# Patient Record
Sex: Female | Born: 1945 | Race: White | Hispanic: No | Marital: Married | Smoking: Former smoker
Health system: Southern US, Community
[De-identification: ages and names within clinical notes are randomized; demographics above are authoritative.]

## PROBLEM LIST (undated history)

## (undated) DIAGNOSIS — E785 Hyperlipidemia, unspecified: Secondary | ICD-10-CM

## (undated) DIAGNOSIS — Z9889 Other specified postprocedural states: Secondary | ICD-10-CM

## (undated) DIAGNOSIS — K8020 Calculus of gallbladder without cholecystitis without obstruction: Secondary | ICD-10-CM

## (undated) DIAGNOSIS — H269 Unspecified cataract: Secondary | ICD-10-CM

## (undated) DIAGNOSIS — D126 Benign neoplasm of colon, unspecified: Secondary | ICD-10-CM

## (undated) DIAGNOSIS — R112 Nausea with vomiting, unspecified: Secondary | ICD-10-CM

## (undated) DIAGNOSIS — R011 Cardiac murmur, unspecified: Secondary | ICD-10-CM

## (undated) DIAGNOSIS — Z952 Presence of prosthetic heart valve: Secondary | ICD-10-CM

## (undated) DIAGNOSIS — K5790 Diverticulosis of intestine, part unspecified, without perforation or abscess without bleeding: Secondary | ICD-10-CM

## (undated) DIAGNOSIS — R519 Headache, unspecified: Secondary | ICD-10-CM

## (undated) DIAGNOSIS — I350 Nonrheumatic aortic (valve) stenosis: Secondary | ICD-10-CM

## (undated) DIAGNOSIS — R51 Headache: Secondary | ICD-10-CM

## (undated) DIAGNOSIS — T7840XA Allergy, unspecified, initial encounter: Secondary | ICD-10-CM

## (undated) DIAGNOSIS — K219 Gastro-esophageal reflux disease without esophagitis: Secondary | ICD-10-CM

## (undated) DIAGNOSIS — M8580 Other specified disorders of bone density and structure, unspecified site: Secondary | ICD-10-CM

## (undated) DIAGNOSIS — B029 Zoster without complications: Secondary | ICD-10-CM

## (undated) DIAGNOSIS — I10 Essential (primary) hypertension: Secondary | ICD-10-CM

## (undated) HISTORY — DX: Benign neoplasm of colon, unspecified: D12.6

## (undated) HISTORY — PX: EYE SURGERY: SHX253

## (undated) HISTORY — DX: Unspecified cataract: H26.9

## (undated) HISTORY — DX: Diverticulosis of intestine, part unspecified, without perforation or abscess without bleeding: K57.90

## (undated) HISTORY — DX: Allergy, unspecified, initial encounter: T78.40XA

## (undated) HISTORY — DX: Essential (primary) hypertension: I10

## (undated) HISTORY — DX: Calculus of gallbladder without cholecystitis without obstruction: K80.20

## (undated) HISTORY — PX: APPENDECTOMY: SHX54

## (undated) HISTORY — DX: Other specified disorders of bone density and structure, unspecified site: M85.80

## (undated) HISTORY — PX: ABDOMINAL HYSTERECTOMY: SHX81

## (undated) HISTORY — DX: Presence of prosthetic heart valve: Z95.2

## (undated) HISTORY — DX: Hyperlipidemia, unspecified: E78.5

---

## 1985-05-17 DIAGNOSIS — D126 Benign neoplasm of colon, unspecified: Secondary | ICD-10-CM

## 1985-05-17 HISTORY — DX: Benign neoplasm of colon, unspecified: D12.6

## 1988-03-19 HISTORY — PX: BREAST EXCISIONAL BIOPSY: SUR124

## 1988-03-19 HISTORY — PX: BREAST BIOPSY: SHX20

## 1996-03-19 LAB — HM COLONOSCOPY: HM Colonoscopy: NEGATIVE

## 1998-01-17 LAB — HM DEXA SCAN

## 2000-03-18 ENCOUNTER — Encounter (INDEPENDENT_AMBULATORY_CARE_PROVIDER_SITE_OTHER): Payer: Self-pay | Admitting: Specialist

## 2000-03-18 ENCOUNTER — Other Ambulatory Visit: Admission: RE | Admit: 2000-03-18 | Discharge: 2000-03-18 | Payer: Self-pay | Admitting: Gastroenterology

## 2000-10-18 ENCOUNTER — Encounter: Payer: Self-pay | Admitting: Family Medicine

## 2000-10-18 ENCOUNTER — Encounter: Admission: RE | Admit: 2000-10-18 | Discharge: 2000-10-18 | Payer: Self-pay | Admitting: Family Medicine

## 2001-05-21 ENCOUNTER — Emergency Department (HOSPITAL_COMMUNITY): Admission: EM | Admit: 2001-05-21 | Discharge: 2001-05-21 | Payer: Self-pay | Admitting: Emergency Medicine

## 2001-05-21 ENCOUNTER — Encounter: Payer: Self-pay | Admitting: Emergency Medicine

## 2002-03-09 ENCOUNTER — Other Ambulatory Visit: Admission: RE | Admit: 2002-03-09 | Discharge: 2002-03-09 | Payer: Self-pay | Admitting: Family Medicine

## 2004-03-01 ENCOUNTER — Ambulatory Visit: Payer: Self-pay | Admitting: Family Medicine

## 2004-09-20 ENCOUNTER — Ambulatory Visit: Payer: Self-pay | Admitting: Family Medicine

## 2004-10-10 ENCOUNTER — Ambulatory Visit: Payer: Self-pay | Admitting: Family Medicine

## 2004-12-21 ENCOUNTER — Ambulatory Visit: Payer: Self-pay | Admitting: Family Medicine

## 2005-01-23 ENCOUNTER — Ambulatory Visit: Payer: Self-pay | Admitting: Family Medicine

## 2005-04-10 ENCOUNTER — Ambulatory Visit: Payer: Self-pay | Admitting: Family Medicine

## 2005-09-25 ENCOUNTER — Other Ambulatory Visit: Admission: RE | Admit: 2005-09-25 | Discharge: 2005-09-25 | Payer: Self-pay | Admitting: Family Medicine

## 2005-09-25 ENCOUNTER — Ambulatory Visit: Payer: Self-pay | Admitting: Family Medicine

## 2005-09-25 LAB — CONVERTED CEMR LAB: Pap Smear: NORMAL

## 2005-10-10 ENCOUNTER — Ambulatory Visit: Payer: Self-pay | Admitting: Family Medicine

## 2005-10-17 ENCOUNTER — Ambulatory Visit: Payer: Self-pay | Admitting: Family Medicine

## 2005-10-17 HISTORY — PX: KNEE SURGERY: SHX244

## 2006-01-29 ENCOUNTER — Ambulatory Visit: Payer: Self-pay | Admitting: Family Medicine

## 2006-10-07 ENCOUNTER — Encounter: Payer: Self-pay | Admitting: Family Medicine

## 2006-10-09 ENCOUNTER — Encounter: Payer: Self-pay | Admitting: Family Medicine

## 2006-10-09 DIAGNOSIS — J45909 Unspecified asthma, uncomplicated: Secondary | ICD-10-CM | POA: Insufficient documentation

## 2006-10-09 DIAGNOSIS — Z87891 Personal history of nicotine dependence: Secondary | ICD-10-CM | POA: Insufficient documentation

## 2006-10-09 DIAGNOSIS — E7800 Pure hypercholesterolemia, unspecified: Secondary | ICD-10-CM | POA: Insufficient documentation

## 2006-10-09 DIAGNOSIS — M1990 Unspecified osteoarthritis, unspecified site: Secondary | ICD-10-CM | POA: Insufficient documentation

## 2006-10-09 DIAGNOSIS — Z860100 Personal history of colon polyps, unspecified: Secondary | ICD-10-CM | POA: Insufficient documentation

## 2006-10-09 DIAGNOSIS — H409 Unspecified glaucoma: Secondary | ICD-10-CM | POA: Insufficient documentation

## 2006-10-09 DIAGNOSIS — I10 Essential (primary) hypertension: Secondary | ICD-10-CM | POA: Insufficient documentation

## 2006-10-21 ENCOUNTER — Encounter (INDEPENDENT_AMBULATORY_CARE_PROVIDER_SITE_OTHER): Payer: Self-pay | Admitting: *Deleted

## 2006-10-22 ENCOUNTER — Ambulatory Visit: Payer: Self-pay | Admitting: Family Medicine

## 2006-10-23 LAB — CONVERTED CEMR LAB
ALT: 31 units/L (ref 0–35)
AST: 32 units/L (ref 0–37)
Albumin: 3.9 g/dL (ref 3.5–5.2)
Alkaline Phosphatase: 90 units/L (ref 39–117)
BUN: 10 mg/dL (ref 6–23)
Basophils Absolute: 0.1 10*3/uL (ref 0.0–0.1)
Basophils Relative: 1.1 % — ABNORMAL HIGH (ref 0.0–1.0)
Bilirubin, Direct: 0.1 mg/dL (ref 0.0–0.3)
CO2: 29 meq/L (ref 19–32)
Calcium: 10.3 mg/dL (ref 8.4–10.5)
Chloride: 100 meq/L (ref 96–112)
Cholesterol: 176 mg/dL (ref 0–200)
Creatinine, Ser: 1 mg/dL (ref 0.4–1.2)
Eosinophils Absolute: 0.1 10*3/uL (ref 0.0–0.6)
Eosinophils Relative: 1.8 % (ref 0.0–5.0)
GFR calc Af Amer: 72 mL/min
GFR calc non Af Amer: 60 mL/min
Glucose, Bld: 88 mg/dL (ref 70–99)
HCT: 34.3 % — ABNORMAL LOW (ref 36.0–46.0)
HDL: 63.2 mg/dL (ref >39.0)
Hemoglobin: 11.7 g/dL — ABNORMAL LOW (ref 12.0–15.0)
LDL Cholesterol: 93 mg/dL (ref 0–99)
Lymphocytes Relative: 26 % (ref 12.0–46.0)
MCHC: 34.1 g/dL (ref 30.0–36.0)
MCV: 89.1 fL (ref 78.0–100.0)
Monocytes Absolute: 0.6 10*3/uL (ref 0.2–0.7)
Monocytes Relative: 9.2 % (ref 3.0–11.0)
Neutro Abs: 3.9 10*3/uL (ref 1.4–7.7)
Neutrophils Relative %: 61.9 % (ref 43.0–77.0)
Phosphorus: 4.4 mg/dL (ref 2.3–4.6)
Platelets: 284 10*3/uL (ref 150–400)
Potassium: 3.7 meq/L (ref 3.5–5.1)
RBC: 3.85 M/uL — ABNORMAL LOW (ref 3.87–5.11)
RDW: 12.8 % (ref 11.5–14.6)
Sodium: 137 meq/L (ref 135–145)
TSH: 1.18 microintl units/mL (ref 0.35–5.50)
Total Bilirubin: 1 mg/dL (ref 0.3–1.2)
Total CHOL/HDL Ratio: 2.8
Total Protein: 7.2 g/dL (ref 6.0–8.3)
Triglycerides: 101 mg/dL (ref 0–149)
VLDL: 20 mg/dL (ref 0–40)
WBC: 6.3 10*3/uL (ref 4.5–10.5)

## 2006-10-24 ENCOUNTER — Ambulatory Visit: Payer: Self-pay | Admitting: Family Medicine

## 2006-10-25 LAB — CONVERTED CEMR LAB: Vit D, 1,25-Dihydroxy: 49 (ref 20–57)

## 2006-11-01 ENCOUNTER — Ambulatory Visit: Payer: Self-pay | Admitting: Family Medicine

## 2006-11-04 ENCOUNTER — Encounter (INDEPENDENT_AMBULATORY_CARE_PROVIDER_SITE_OTHER): Payer: Self-pay | Admitting: *Deleted

## 2006-11-04 LAB — FECAL OCCULT BLOOD, GUAIAC: Fecal Occult Blood: NEGATIVE

## 2006-12-05 ENCOUNTER — Ambulatory Visit: Payer: Self-pay | Admitting: Family Medicine

## 2006-12-05 DIAGNOSIS — D649 Anemia, unspecified: Secondary | ICD-10-CM | POA: Insufficient documentation

## 2006-12-06 ENCOUNTER — Encounter (INDEPENDENT_AMBULATORY_CARE_PROVIDER_SITE_OTHER): Payer: Self-pay | Admitting: *Deleted

## 2006-12-06 LAB — CONVERTED CEMR LAB
ALT: 26 units/L (ref 0–35)
AST: 30 units/L (ref 0–37)
Cholesterol: 173 mg/dL (ref 0–200)
Ferritin: 27.3 ng/mL (ref 10.0–291.0)
HDL: 70.4 mg/dL (ref >39.0)
Hemoglobin: 11.8 g/dL — ABNORMAL LOW (ref 12.0–15.0)
LDL Cholesterol: 90 mg/dL (ref 0–99)
Total CHOL/HDL Ratio: 2.5
Triglycerides: 63 mg/dL (ref 0–149)
VLDL: 13 mg/dL (ref 0–40)

## 2007-01-10 ENCOUNTER — Telehealth (INDEPENDENT_AMBULATORY_CARE_PROVIDER_SITE_OTHER): Payer: Self-pay | Admitting: *Deleted

## 2007-09-08 ENCOUNTER — Encounter: Payer: Self-pay | Admitting: Family Medicine

## 2007-09-09 ENCOUNTER — Telehealth (INDEPENDENT_AMBULATORY_CARE_PROVIDER_SITE_OTHER): Payer: Self-pay | Admitting: Internal Medicine

## 2007-10-08 ENCOUNTER — Encounter: Payer: Self-pay | Admitting: Family Medicine

## 2007-10-14 ENCOUNTER — Ambulatory Visit: Payer: Self-pay | Admitting: Gastroenterology

## 2007-10-28 ENCOUNTER — Ambulatory Visit: Payer: Self-pay | Admitting: Gastroenterology

## 2007-10-28 LAB — HM COLONOSCOPY

## 2008-10-12 ENCOUNTER — Encounter: Payer: Self-pay | Admitting: Family Medicine

## 2008-10-15 ENCOUNTER — Encounter: Payer: Self-pay | Admitting: Family Medicine

## 2009-01-18 ENCOUNTER — Ambulatory Visit: Payer: Self-pay | Admitting: Family Medicine

## 2009-01-20 LAB — CONVERTED CEMR LAB
ALT: 24 units/L (ref 0–35)
AST: 31 units/L (ref 0–37)
Albumin: 4.1 g/dL (ref 3.5–5.2)
Alkaline Phosphatase: 66 units/L (ref 39–117)
BUN: 19 mg/dL (ref 6–23)
Basophils Absolute: 0.1 10*3/uL (ref 0.0–0.1)
Basophils Relative: 1.7 % (ref 0.0–3.0)
Bilirubin, Direct: 0.1 mg/dL (ref 0.0–0.3)
CO2: 29 meq/L (ref 19–32)
Calcium: 9.5 mg/dL (ref 8.4–10.5)
Chloride: 102 meq/L (ref 96–112)
Cholesterol: 183 mg/dL (ref 0–200)
Creatinine, Ser: 1.2 mg/dL (ref 0.4–1.2)
Eosinophils Absolute: 0.1 10*3/uL (ref 0.0–0.7)
Eosinophils Relative: 1.6 % (ref 0.0–5.0)
GFR calc non Af Amer: 48.02 mL/min (ref >60)
Glucose, Bld: 91 mg/dL (ref 70–99)
HCT: 37.4 % (ref 36.0–46.0)
HDL: 62.5 mg/dL (ref >39.00)
Hemoglobin: 13.3 g/dL (ref 12.0–15.0)
LDL Cholesterol: 105 mg/dL — ABNORMAL HIGH (ref 0–99)
Lymphocytes Relative: 29.7 % (ref 12.0–46.0)
Lymphs Abs: 1.5 10*3/uL (ref 0.7–4.0)
MCHC: 35.7 g/dL (ref 30.0–36.0)
MCV: 93.2 fL (ref 78.0–100.0)
Monocytes Absolute: 0.5 10*3/uL (ref 0.1–1.0)
Monocytes Relative: 9.8 % (ref 3.0–12.0)
Neutro Abs: 2.9 10*3/uL (ref 1.4–7.7)
Neutrophils Relative %: 57.2 % (ref 43.0–77.0)
Platelets: 244 10*3/uL (ref 150.0–400.0)
Potassium: 4.1 meq/L (ref 3.5–5.1)
RBC: 4.01 M/uL (ref 3.87–5.11)
RDW: 12.5 % (ref 11.5–14.6)
Sodium: 138 meq/L (ref 135–145)
TSH: 0.99 microintl units/mL (ref 0.35–5.50)
Total Bilirubin: 0.8 mg/dL (ref 0.3–1.2)
Total CHOL/HDL Ratio: 3
Total Protein: 7.6 g/dL (ref 6.0–8.3)
Triglycerides: 80 mg/dL (ref 0.0–149.0)
VLDL: 16 mg/dL (ref 0.0–40.0)
WBC: 5.1 10*3/uL (ref 4.5–10.5)

## 2009-03-22 ENCOUNTER — Encounter: Payer: Self-pay | Admitting: Family Medicine

## 2009-04-04 ENCOUNTER — Encounter: Payer: Self-pay | Admitting: Family Medicine

## 2009-04-05 ENCOUNTER — Encounter: Payer: Self-pay | Admitting: Family Medicine

## 2009-04-07 ENCOUNTER — Telehealth: Payer: Self-pay | Admitting: Family Medicine

## 2009-11-15 ENCOUNTER — Encounter: Payer: Self-pay | Admitting: Family Medicine

## 2009-11-15 LAB — HM MAMMOGRAPHY: HM Mammogram: NORMAL

## 2009-11-17 ENCOUNTER — Encounter: Payer: Self-pay | Admitting: Family Medicine

## 2009-12-21 ENCOUNTER — Ambulatory Visit: Payer: Self-pay | Admitting: Family Medicine

## 2009-12-21 DIAGNOSIS — K219 Gastro-esophageal reflux disease without esophagitis: Secondary | ICD-10-CM | POA: Insufficient documentation

## 2010-01-20 ENCOUNTER — Telehealth (INDEPENDENT_AMBULATORY_CARE_PROVIDER_SITE_OTHER): Payer: Self-pay | Admitting: *Deleted

## 2010-01-24 ENCOUNTER — Ambulatory Visit: Payer: Self-pay | Admitting: Family Medicine

## 2010-01-25 LAB — CONVERTED CEMR LAB
ALT: 24 units/L (ref 0–35)
AST: 26 units/L (ref 0–37)
Albumin: 3.8 g/dL (ref 3.5–5.2)
Alkaline Phosphatase: 79 units/L (ref 39–117)
BUN: 20 mg/dL (ref 6–23)
Basophils Absolute: 0.1 10*3/uL (ref 0.0–0.1)
Basophils Relative: 1.2 % (ref 0.0–3.0)
Bilirubin, Direct: 0.1 mg/dL (ref 0.0–0.3)
CO2: 31 meq/L (ref 19–32)
Calcium: 10 mg/dL (ref 8.4–10.5)
Chloride: 100 meq/L (ref 96–112)
Cholesterol: 178 mg/dL (ref 0–200)
Creatinine, Ser: 1.2 mg/dL (ref 0.4–1.2)
Eosinophils Absolute: 0.1 10*3/uL (ref 0.0–0.7)
Eosinophils Relative: 1.4 % (ref 0.0–5.0)
GFR calc non Af Amer: 50.27 mL/min (ref >60)
Glucose, Bld: 99 mg/dL (ref 70–99)
HCT: 37.8 % (ref 36.0–46.0)
HDL: 56.9 mg/dL (ref >39.00)
Hemoglobin: 12.9 g/dL (ref 12.0–15.0)
LDL Cholesterol: 99 mg/dL (ref 0–99)
Lymphocytes Relative: 26.5 % (ref 12.0–46.0)
Lymphs Abs: 1.6 10*3/uL (ref 0.7–4.0)
MCHC: 34 g/dL (ref 30.0–36.0)
MCV: 92.8 fL (ref 78.0–100.0)
Monocytes Absolute: 0.5 10*3/uL (ref 0.1–1.0)
Monocytes Relative: 8.2 % (ref 3.0–12.0)
Neutro Abs: 3.9 10*3/uL (ref 1.4–7.7)
Neutrophils Relative %: 62.7 % (ref 43.0–77.0)
Phosphorus: 3.7 mg/dL (ref 2.3–4.6)
Platelets: 272 10*3/uL (ref 150.0–400.0)
Potassium: 4.5 meq/L (ref 3.5–5.1)
RBC: 4.08 M/uL (ref 3.87–5.11)
RDW: 13.2 % (ref 11.5–14.6)
Sodium: 139 meq/L (ref 135–145)
TSH: 1.6 microintl units/mL (ref 0.35–5.50)
Total Bilirubin: 0.6 mg/dL (ref 0.3–1.2)
Total CHOL/HDL Ratio: 3
Total Protein: 6.9 g/dL (ref 6.0–8.3)
Triglycerides: 109 mg/dL (ref 0.0–149.0)
VLDL: 21.8 mg/dL (ref 0.0–40.0)
WBC: 6.2 10*3/uL (ref 4.5–10.5)

## 2010-01-31 ENCOUNTER — Ambulatory Visit: Payer: Self-pay | Admitting: Family Medicine

## 2010-01-31 DIAGNOSIS — M8580 Other specified disorders of bone density and structure, unspecified site: Secondary | ICD-10-CM | POA: Insufficient documentation

## 2010-02-01 ENCOUNTER — Encounter: Payer: Self-pay | Admitting: Family Medicine

## 2010-02-01 LAB — HM DEXA SCAN: HM Dexa Scan: NORMAL

## 2010-02-07 ENCOUNTER — Telehealth: Payer: Self-pay | Admitting: Family Medicine

## 2010-02-14 ENCOUNTER — Encounter: Payer: Self-pay | Admitting: Family Medicine

## 2010-04-18 NOTE — Assessment & Plan Note (Signed)
Summary: Heidi Middleton / [facility]  Nurse Visit    Prior Medications: GNP GLUCOSAMINE CHONDROITIN   TABS (MISC NATURAL PRODUCTS) take 1000 mg by mouth daily HYDROCHLOROTHIAZIDE 25 MG  TABS (HYDROCHLOROTHIAZIDE) take one by mouth daily FISH OIL TABS () take by mouth as directed LIPITOR 10 MG  TABS (ATORVASTATIN CALCIUM) one by mouth qd CALCIUM WITH VIT D () take 1200 mg by mouth daily VITAMIN C 1000 MG  TABS (ASCORBIC ACID) take one by mouth daily Current Allergies: No known allergies    Zostavax # 1    Vaccine Type: Zostavax    Site: left deltoid    Mfr: Merck    Dose: 0.5 ml    Route: [place]    Given by: Lowella Petties    Exp. Date: 10/11/2007    Lot #: [id number]    VIS given: 12/29/04 given October 24, 2006.   Orders Added: 1)  Zoster (Shingles) Vaccine Live [90736] 2)  Admin 1st Vaccine [phone]
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
## 2010-04-18 NOTE — Progress Notes (Signed)
Summary: 90x3 lipitor rx  Phone Note Refill Request Message from:  Fax from Pharmacy on January 10, 2007 8:07 AM  Refills Requested: Medication #1:  LIPITOR 10 MG  TABS one half by mouth qd rx approved by dr Milinda Antis #90x3  Initial call taken by: Liane Comber,  January 10, 2007 8:07 AM
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
## 2010-04-18 NOTE — Progress Notes (Signed)
Summary: prior Berkley Harvey is needed for nexium  Phone Note Other Incoming   Caller: [employer] Summary of Call: Prior Berkley Harvey is needed for nexium, form is on your shelf. Initial call taken by: Lowella Petties CMA, AAMA,  February 07, 2010 3:51 PM  Follow-up for Phone Call        I read it -- it asks if pt has previously tried or failed other meds for GERD-- and what they are let me know  if not -- they may not cover nexium and we would need to switch meds   Follow-up by: Judith Part MD,  February 08, 2010 8:10 AM  Additional Follow-up for Phone Call Additional follow up Details #1::        Asked husband to have her return call.  Lugene Fuquay CMA Duncan Dull)  February 08, 2010 9:26 AM   Pt states she has tried otc omeprazole and zantac, along with other otc brands. Additional Follow-up by: Lowella Petties CMA, AAMA,  February 08, 2010 10:02 AM   New Allergies: * OMEPRAZOLE ZANTAC Additional Follow-up for Phone Call Additional follow up Details #2::    thank you  form done and in nurse in box  Follow-up by: Judith Part MD,  February 08, 2010 1:37 PM  Additional Follow-up for Phone Call Additional follow up Details #3:: Details for Additional Follow-up Action Taken: Form faxed.              Lowella Petties CMA, AAMA  February 08, 2010 3:41 PM   Prior Berkley Harvey given for nexium, approval letter placed on doctors desk for signature and scanning. Additional Follow-up by: Lowella Petties CMA, AAMA,  February 14, 2010 9:00 AM  New Allergies: * OMEPRAZOLE ZANTAC

## 2010-04-18 NOTE — Progress Notes (Signed)
Summary: regarding labs  Phone Note Call from Patient Call back at Home Phone [phone]   Caller: Patient Call For: Judith Part MD Summary of Call: Pt is asking when do you want her to come back in for labs. Initial call taken by: Lowella Petties CMA,  April 07, 2009 10:57 AM  Follow-up for Phone Call        1 year - 1 week prior to PE please Follow-up by: Judith Part MD,  April 07, 2009 12:20 PM  Additional Follow-up for Phone Call Additional follow up Details #1::        Appts made for labs and physical. Additional Follow-up by: Lowella Petties CMA,  April 07, 2009 12:41 PM

## 2010-04-18 NOTE — Miscellaneous (Signed)
Summary: previsit prep  Clinical Lists Changes  Medications: Added new medication of MOVIPREP 100 GM  SOLR (PEG-KCL-NACL-NASULF-NA ASC-C) As per prep instructions. - Signed Rx of MOVIPREP 100 GM  SOLR (PEG-KCL-NACL-NASULF-NA ASC-C) As per prep instructions.;  #1 x 0;  Signed;  Entered by: Eual Fines RN;  Authorized by: Mardella Layman MD FACG,FAGA;  Method used: Electronic Allergies: Added new allergy or adverse reaction of CODEINE Observations: Added new observation of NKA: F (10/14/2007 8:12)    Prescriptions: MOVIPREP 100 GM  SOLR (PEG-KCL-NACL-NASULF-NA ASC-C) As per prep instructions.  #1 x 0   Entered by:   Eual Fines RN   Authorized by:   Mardella Layman MD [facility]   Signed by:   Eual Fines RN on 10/14/2007   Method used:   Elect[facility] to ...       [facility] [address] [id number]       [address]       [place], [place]  [address]       Ph: [phone] or [phone]       Fax: [phone]   RxID:   [id number]
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
## 2010-04-18 NOTE — Medication Information (Signed)
Summary: Prior autho & approved for Nexium/BCBS  Prior autho & approved for Nexium/BCBS   Imported By: Sherian Rein 02/21/2010 08:35:47  _____________________________________________________________________  External Attachment:    Type:   Image     Comment:   External Document

## 2010-04-18 NOTE — Miscellaneous (Signed)
Summary: med dose change  Clinical Lists Changes  Medications: Changed medication from LIPITOR 10 MG  TABS (ATORVASTATIN CALCIUM) one by mouth qd to LIPITOR 10 MG  TABS (ATORVASTATIN CALCIUM) one half by mouth qd      Prior Medications: GNP GLUCOSAMINE CHONDROITIN   TABS (MISC NATURAL PRODUCTS) take 1000 mg by mouth daily HYDROCHLOROTHIAZIDE 25 MG  TABS (HYDROCHLOROTHIAZIDE) take one by mouth daily FISH OIL TABS () take by mouth as directed LIPITOR 10 MG  TABS (ATORVASTATIN CALCIUM) one half by mouth qd CALCIUM WITH VIT D () take 1200 mg by mouth daily VITAMIN C 1000 MG  TABS (ASCORBIC ACID) take one by mouth daily Current Allergies: No known allergies

## 2010-04-18 NOTE — Assessment & Plan Note (Signed)
Summary: hemo results    Preventive Care Screening  Hemoccult:    Date:  11/04/2006    Results:  negative x 3

## 2010-04-18 NOTE — Assessment & Plan Note (Signed)
Summary: mammo results    Preventive Care Screening  Mammogram:    Date:  10/21/2006    Results:  normal

## 2010-04-18 NOTE — Progress Notes (Signed)
Summary: Mammogram and bone density test  Phone Note Call from Patient Call back at Home Phone [phone]   Caller: Patient Call For: Billie Karthik Whittinghill in Dr. Royden Purl absence Summary of Call: Patient says she has gotten notification that it is time for her bone density test and mammogram.  She says she goes to the one on [address] in [place].  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 However, it has to be after 10/01/07 for insurance reasons.  Wants to be scheduled at the same time for mammo and bone density test.  Please send referral to [facility] for scheduling. Initial call taken by: Delilah Shan,  September 09, 2007 12:26 PM  Follow-up for Phone Call        order for mammo and DEXA attatched   Billie-Lynn Tyler Deis FNP  September 10, 2007 6:14 AM   Additional Follow-up for Phone Call Additional follow up Details #1::        MMG AND DEX ORDERED FOR 10/03/07 AT 10:30. Carlton Adam  September 10, 2007 10:57 AM  Additional Follow-up by: Carlton Adam,  September 10, 2007 10:57 AM  New Problems: OTHER SCREENING MAMMOGRAM (ICD-V76.12)   New Problems: OTHER SCREENING MAMMOGRAM (ICD-V76.12)

## 2010-04-18 NOTE — Letter (Signed)
Summary: Results Follow up Letter  [place] [facility]  [address] [place], [place] [address]   Phone: [phone]  Fax: [phone]    11/17/2009 MRN: [id number]    [facility] [address] [place], [place]  [address]    Dear Ms. Sturdivant,  The following are the results of your recent test(s):  Test         Result    Pap Smear:        Normal _____  Not Normal _____ Comments: ______________________________________________________ Cholesterol: LDL(Bad cholesterol):         Your goal is less than:         HDL (Good cholesterol):       Your goal is more than: Comments:  ______________________________________________________ Mammogram:        Normal _X___  Not Normal _____ Comments:Repeat in one year.   ___________________________________________________________________ Hemoccult:        Normal _____  Not normal _______ Comments:    _____________________________________________________________________ Other Tests:    We routinely do not discuss normal results over the telephone.  If you desire a copy of the results, or you have any questions about this information we can discuss them at your next office visit.   Sincerely,    Idamae Schuller Ahamed Hofland,MD  MT/ri

## 2010-04-18 NOTE — Progress Notes (Signed)
----   Converted from flag ---- ---- 01/19/2010 5:16 PM, Colon Flattery Tower MD wrote: please check renal / lipid/hepatic/ cbc with diff/ tsh for 401.1 thanks  ---- 01/19/2010 7:49 AM, Liane Comber CMA (AAMA) wrote: Lab orders please! Good Morning! This pt is scheduled for cpx labs Tuesday, which labs to draw and dx codes to use? Thanks Tasha ------------------------------
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
## 2010-05-23 ENCOUNTER — Telehealth: Payer: Self-pay | Admitting: Family Medicine

## 2010-05-23 ENCOUNTER — Encounter: Payer: Self-pay | Admitting: Family Medicine

## 2010-05-23 ENCOUNTER — Ambulatory Visit (INDEPENDENT_AMBULATORY_CARE_PROVIDER_SITE_OTHER): Payer: Medicare Other

## 2010-05-23 DIAGNOSIS — Z111 Encounter for screening for respiratory tuberculosis: Secondary | ICD-10-CM

## 2010-05-25 ENCOUNTER — Ambulatory Visit: Payer: Medicare Other

## 2010-05-25 ENCOUNTER — Encounter: Payer: Self-pay | Admitting: Family Medicine

## 2010-05-30 NOTE — Letter (Signed)
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

## 2010-05-30 NOTE — Assessment & Plan Note (Signed)
Summary: tb test jrt  Nurse Visit   Allergies: 1)  ! Codeine 2)  * Omeprazole 3)  Zantac  Immunizations Administered:  PPD Skin Test:    Vaccine Type: PPD    Site: right forearm    Mfr: Sanofi Pasteur    Dose: 0.1 ml    Route: ID    Given by: Linde Gillis CMA (AAMA)    Exp. Date: 05/31/2012    Lot #: [id number]  Orders Added: 1)  TB Skin Test [86580] 2)  Admin 1st Vaccine [phone]

## 2010-05-30 NOTE — Progress Notes (Signed)
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 Initial call taken by: Lowella Petties CMA, AAMA,  May 23, 2010 4:11 PM  Follow-up for Phone Call        I signed and filled it out - just needs PPD result when it comes in  will leave on Rena's desk Follow-up by: Judith Part MD,  May 23, 2010 4:49 PM  Additional Follow-up for Phone Call Additional follow up Details #1::        Completed form copied to be scanned here at [facility] site. Completed form was given to pt as instructed.Lewanda Rife LPN  May 25, 2839 2:40 PM

## 2010-06-06 NOTE — Assessment & Plan Note (Signed)
Summary: ppd reading jrt  Nurse Visit   Allergies: 1)  ! Codeine 2)  * Omeprazole 3)  Zantac  PPD Results    Date of reading: 05/25/2010    Results: < 5mm    Interpretation: negative  CC: TB skin test recheck  The patient presented after 48 hours to check the injeciton site for positive or negative reaction.  Injection site examination: No firm bump forms at the test site. slightly reddish appearance and diameter was smaller than 5mm.   Assessment and Plan:  Negative TB skin test. Patient was counseled to call if experience any irritation at site.Lewanda Rife LPN  May 25, 5954 2:35 PM

## 2010-11-27 LAB — HM MAMMOGRAPHY: HM Mammogram: NORMAL

## 2010-12-19 ENCOUNTER — Other Ambulatory Visit: Payer: Self-pay | Admitting: *Deleted

## 2010-12-19 MED ORDER — ATORVASTATIN CALCIUM 10 MG PO TABS: ORAL_TABLET | ORAL | Status: DC

## 2010-12-19 MED ORDER — ESOMEPRAZOLE MAGNESIUM 40 MG PO CPDR: 40.0000 mg | DELAYED_RELEASE_CAPSULE | Freq: Every day | ORAL | Status: DC

## 2010-12-19 NOTE — Telephone Encounter (Signed)
Received faxed refill request.  Patients last OV was 01/2010, next OV 03/2011.  Please advise.

## 2010-12-19 NOTE — Telephone Encounter (Signed)
Will refill electronically  

## 2010-12-25 ENCOUNTER — Encounter: Payer: Self-pay | Admitting: *Deleted

## 2011-03-30 ENCOUNTER — Other Ambulatory Visit: Payer: BC Managed Care – PPO

## 2011-04-06 ENCOUNTER — Encounter: Payer: BC Managed Care – PPO | Admitting: Family Medicine

## 2011-04-26 ENCOUNTER — Other Ambulatory Visit: Payer: Self-pay

## 2011-04-26 MED ORDER — HYDROCHLOROTHIAZIDE 25 MG PO TABS: 25.0000 mg | ORAL_TABLET | Freq: Every day | ORAL | Status: DC

## 2011-04-26 NOTE — Telephone Encounter (Signed)
[facility] request refill HCTZ 25 mg. Pt last seen 01/31/10 but pt already has scheduled CPX pm 06/11/11.Please advise.

## 2011-04-26 NOTE — Telephone Encounter (Signed)
Will refill electronically  

## 2011-05-01 ENCOUNTER — Other Ambulatory Visit: Payer: Self-pay | Admitting: *Deleted

## 2011-05-01 MED ORDER — HYDROCHLOROTHIAZIDE 25 MG PO TABS: 25.0000 mg | ORAL_TABLET | Freq: Every day | ORAL | Status: DC

## 2011-05-01 NOTE — Telephone Encounter (Signed)
Medication sent to the wrong pharmacy on 1st request

## 2011-06-03 ENCOUNTER — Telehealth: Payer: Self-pay | Admitting: Family Medicine

## 2011-06-03 DIAGNOSIS — E7800 Pure hypercholesterolemia, unspecified: Secondary | ICD-10-CM

## 2011-06-03 DIAGNOSIS — M899 Disorder of bone, unspecified: Secondary | ICD-10-CM

## 2011-06-03 DIAGNOSIS — D649 Anemia, unspecified: Secondary | ICD-10-CM

## 2011-06-03 DIAGNOSIS — M949 Disorder of cartilage, unspecified: Secondary | ICD-10-CM

## 2011-06-03 DIAGNOSIS — I10 Essential (primary) hypertension: Secondary | ICD-10-CM

## 2011-06-03 NOTE — Telephone Encounter (Signed)
Message copied by Judy Pimple on Sun Jun 03, 2011  8:56 PM ------      Message from: Baldomero Lamy      Created: Wed May 30, 2011 10:02 AM      Regarding: cpx labs Mon 06/04/11       Please order  future cpx labs for pt's upcomming lab appt.      Thanks      Rodney Booze

## 2011-06-04 ENCOUNTER — Other Ambulatory Visit: Payer: Self-pay | Admitting: Family Medicine

## 2011-06-04 ENCOUNTER — Other Ambulatory Visit (INDEPENDENT_AMBULATORY_CARE_PROVIDER_SITE_OTHER): Payer: Medicare Other

## 2011-06-04 DIAGNOSIS — M949 Disorder of cartilage, unspecified: Secondary | ICD-10-CM

## 2011-06-04 DIAGNOSIS — E7800 Pure hypercholesterolemia, unspecified: Secondary | ICD-10-CM

## 2011-06-04 DIAGNOSIS — I10 Essential (primary) hypertension: Secondary | ICD-10-CM

## 2011-06-04 DIAGNOSIS — D649 Anemia, unspecified: Secondary | ICD-10-CM

## 2011-06-04 DIAGNOSIS — M899 Disorder of bone, unspecified: Secondary | ICD-10-CM

## 2011-06-04 LAB — CBC WITH DIFFERENTIAL/PLATELET
Basophils Absolute: 0 10*3/uL (ref 0.0–0.1)
Basophils Relative: 0.8 % (ref 0.0–3.0)
Eosinophils Absolute: 0.1 10*3/uL (ref 0.0–0.7)
Eosinophils Relative: 1.8 % (ref 0.0–5.0)
HCT: 39 % (ref 36.0–46.0)
Hemoglobin: 13 g/dL (ref 12.0–15.0)
Lymphocytes Relative: 27.1 % (ref 12.0–46.0)
Lymphs Abs: 1.7 10*3/uL (ref 0.7–4.0)
MCHC: 33.3 g/dL (ref 30.0–36.0)
MCV: 91.7 fl (ref 78.0–100.0)
Monocytes Absolute: 0.5 10*3/uL (ref 0.1–1.0)
Monocytes Relative: 8.3 % (ref 3.0–12.0)
Neutro Abs: 3.9 10*3/uL (ref 1.4–7.7)
Neutrophils Relative %: 62 % (ref 43.0–77.0)
Platelets: 243 10*3/uL (ref 150.0–400.0)
RBC: 4.25 Mil/uL (ref 3.87–5.11)
RDW: 13.2 % (ref 11.5–14.6)
WBC: 6.3 10*3/uL (ref 4.5–10.5)

## 2011-06-04 LAB — COMPREHENSIVE METABOLIC PANEL
ALT: 21 U/L (ref 0–35)
AST: 28 U/L (ref 0–37)
Albumin: 3.9 g/dL (ref 3.5–5.2)
Alkaline Phosphatase: 64 U/L (ref 39–117)
BUN: 16 mg/dL (ref 6–23)
CO2: 28 mEq/L (ref 19–32)
Calcium: 9.8 mg/dL (ref 8.4–10.5)
Chloride: 101 mEq/L (ref 96–112)
Creatinine, Ser: 1.2 mg/dL (ref 0.4–1.2)
GFR: 45.89 mL/min — ABNORMAL LOW (ref >60.00)
Glucose, Bld: 95 mg/dL (ref 70–99)
Potassium: 3.9 mEq/L (ref 3.5–5.1)
Sodium: 137 mEq/L (ref 135–145)
Total Bilirubin: 0.4 mg/dL (ref 0.3–1.2)
Total Protein: 7.5 g/dL (ref 6.0–8.3)

## 2011-06-04 LAB — LIPID PANEL
Cholesterol: 186 mg/dL (ref 0–200)
HDL: 82.6 mg/dL (ref >39.00)
LDL Cholesterol: 89 mg/dL (ref 0–99)
Total CHOL/HDL Ratio: 2
Triglycerides: 72 mg/dL (ref 0.0–149.0)
VLDL: 14.4 mg/dL (ref 0.0–40.0)

## 2011-06-04 LAB — TSH: TSH: 1.56 u[IU]/mL (ref 0.35–5.50)

## 2011-06-04 NOTE — Telephone Encounter (Signed)
[facility] nexium 40 mg #90 x 0. Pt already has scheduled appt CPX on 06/11/11.

## 2011-06-05 LAB — VITAMIN D 25 HYDROXY (VIT D DEFICIENCY, FRACTURES): Vit D, 25-Hydroxy: 51 ng/mL (ref 30–89)

## 2011-06-07 ENCOUNTER — Encounter: Payer: Self-pay | Admitting: Family Medicine

## 2011-06-11 ENCOUNTER — Encounter: Payer: Self-pay | Admitting: Family Medicine

## 2011-06-11 ENCOUNTER — Ambulatory Visit (INDEPENDENT_AMBULATORY_CARE_PROVIDER_SITE_OTHER): Payer: Medicare Other | Admitting: Family Medicine

## 2011-06-11 VITALS — BP 132/80 | HR 72 | Temp 97.8°F | Ht 68.0 in | Wt 166.8 lb

## 2011-06-11 DIAGNOSIS — M899 Disorder of bone, unspecified: Secondary | ICD-10-CM

## 2011-06-11 DIAGNOSIS — E7800 Pure hypercholesterolemia, unspecified: Secondary | ICD-10-CM

## 2011-06-11 DIAGNOSIS — Z860100 Personal history of colon polyps, unspecified: Secondary | ICD-10-CM

## 2011-06-11 DIAGNOSIS — I10 Essential (primary) hypertension: Secondary | ICD-10-CM

## 2011-06-11 DIAGNOSIS — M949 Disorder of cartilage, unspecified: Secondary | ICD-10-CM

## 2011-06-11 MED ORDER — ATORVASTATIN CALCIUM 10 MG PO TABS: ORAL_TABLET | ORAL | Status: DC

## 2011-06-11 MED ORDER — ESOMEPRAZOLE MAGNESIUM 40 MG PO CPDR: 40.0000 mg | DELAYED_RELEASE_CAPSULE | Freq: Every day | ORAL | Status: DC

## 2011-06-11 MED ORDER — HYDROCHLOROTHIAZIDE 25 MG PO TABS: 25.0000 mg | ORAL_TABLET | Freq: Every day | ORAL | Status: DC

## 2011-06-11 NOTE — Assessment & Plan Note (Signed)
This is controlled with lifestyle habits now  Doing very well  Rev labs today

## 2011-06-11 NOTE — Assessment & Plan Note (Signed)
Last dexa in nl range Urged pt to work on good exercise/ca/ d and she is doing well  No fractures  2 year dexa will be next winter

## 2011-06-11 NOTE — Progress Notes (Signed)
Subjective:    Patient ID: Heidi Middleton, female    DOB: 07-Jul-1945, 66 y.o.   MRN: [id number]  HPI Here for check up of chronic medical conditions and to review health mt list  Has retired   [employer] care of great grand children - 2, 6 weeks  Is enjoying that , keeps her young  Is feeling fine  No new medical issues- better than ever    bp is   132/80  Today No cp or palpitations or headaches or edema  No side effects to medicines  - on hctz  Flu shot - did get one in the fall   colonosc 09- 10 year recall  Hx of polyps, but not the last one   Mammogram 9/12- was good  Self exam- no lumps or changes   dexa 11/11 - was in nl range Hx of osteopenia in past  Ca and D Is a good exerciser - does tai chi / yoga/walking   Hx of hysterectomy in past No gyn problems   Lipids- lipitor and diet - doing well  Lab Results  Component Value Date   CHOL 186 06/04/2011   HDL 82.60 06/04/2011   LDLCALC 89 06/04/2011   TRIG 72.0 06/04/2011   CHOLHDL 2 06/04/2011     Had anemia in past Lab Results  Component Value Date   WBC 6.3 06/04/2011   HGB 13.0 06/04/2011   HCT 39.0 06/04/2011   MCV 91.7 06/04/2011   PLT 243.0 06/04/2011   normal -without iron Is eating lot of deep green vegetables   Patient Active Problem List  Diagnoses  . HYPERCHOLESTEROLEMIA  . Quit smoking  . GLAUCOMA  . HYPERTENSION  . REACTIVE AIRWAY DISEASE  . GERD  . OSTEOARTHRITIS  . OSTEOPENIA  . COLONIC POLYPS, HX OF   Past Medical History  Diagnosis Date  . Colonic polyp   . Hypertension   . Osteoporosis    Past Surgical History  Procedure Date  . Abdominal hysterectomy   . Knee surgery 10/2005    calcinosis   History  Substance Use Topics  . Smoking status: Former Games developer  . Smokeless tobacco: Not on file  . Alcohol Use:    Family History  Problem Relation Age of Onset  . Cancer Mother     lung  . Stroke Mother   . Cancer Father     pancreatic  . Heart disease Sister   . Hypertension  Brother   . Diabetes Maternal Grandmother   . Hypertension Brother   . Obesity Sister    Allergies  Allergen Reactions  . Codeine     REACTION: vomiting  . Omeprazole     REACTION: not effective  . Ranitidine Hcl     REACTION: not effective   Current Outpatient Prescriptions on File Prior to Visit  Medication Sig Dispense Refill  . Calcium Carbonate-Vitamin D (CALCIUM-VITAMIN D) 500-200 MG-UNIT per tablet 1200mg  by mouth daily      . cholecalciferol (VITAMIN D) 1000 UNITS tablet Take 1,000 Units by mouth daily.      . Glucosamine-Chondroit-Vit C-Mn (GLUCOSAMINE CHONDR 1500 COMPLX PO) Take 1 tablet by mouth 2 (two) times daily.      . Multiple Vitamin (MULTIVITAMIN) tablet Take 1 tablet by mouth daily.      . Omega-3 Fatty Acids (FISH OIL) 1000 MG CAPS Take 1 capsule by mouth 2 (two) times daily.           Review of Systems Review of Systems  Constitutional: Negative for fever, appetite change, fatigue and unexpected weight change.  Eyes: Negative for pain and visual disturbance.  Respiratory: Negative for cough and shortness of breath.   Cardiovascular: Negative for cp or palpitations    Gastrointestinal: Negative for nausea, diarrhea and constipation.  Genitourinary: Negative for urgency and frequency.  Skin: Negative for pallor or rash   Neurological: Negative for weakness, light-headedness, numbness and headaches.  Hematological: Negative for adenopathy. Does not bruise/bleed easily.  Psychiatric/Behavioral: Negative for dysphoric mood. The patient is not nervous/anxious.          Objective:   Physical Exam  Constitutional: She appears well-developed and well-nourished. No distress.  HENT:  Head: Normocephalic and atraumatic.  Right Ear: External ear normal.  Left Ear: External ear normal.  Nose: Nose normal.  Mouth/Throat: Oropharynx is clear and moist.  Eyes: Conjunctivae and EOM are normal. Pupils are equal, round, and reactive to light. Right eye exhibits no  discharge. Left eye exhibits no discharge.  Neck: Normal range of motion. Neck supple. No JVD present. No thyromegaly present.  Cardiovascular: Normal rate, regular rhythm and normal heart sounds.  Exam reveals no gallop.   Pulmonary/Chest: Effort normal. No respiratory distress. She has no wheezes.  Abdominal: Soft. Bowel sounds are normal. She exhibits no distension and no mass. There is no tenderness.  Genitourinary: No breast swelling, tenderness, discharge or bleeding.       Breast exam: No mass, nodules, thickening, tenderness, bulging, retraction, inflamation, nipple discharge or skin changes noted.  No axillary or clavicular LA.  Chaperoned exam.    Musculoskeletal: Normal range of motion. She exhibits no edema and no tenderness.  Lymphadenopathy:    She has no cervical adenopathy.  Neurological: She is alert. She has normal reflexes. No cranial nerve deficit. She exhibits normal muscle tone. Coordination normal.  Skin: Skin is warm and dry. No rash noted. No erythema. No pallor.       Solar lentigos diffusely   Psychiatric: She has a normal mood and affect.          Assessment & Plan:

## 2011-06-11 NOTE — Assessment & Plan Note (Signed)
Last colonosc 09 with 10 y f/u - because it was clear  No bowel changes

## 2011-06-11 NOTE — Assessment & Plan Note (Signed)
Very good control with lipitor and diet  Disc goals for lipids and reasons to control them Rev labs with pt Rev low sat fat diet in detail  

## 2011-06-11 NOTE — Patient Instructions (Signed)
You need a Tdap vaccine -- call insurance to see if they cover it  If so- call us to schedule the shot  If not - get it at the health dept  Keep up the good work with healthy diet and exercise and good habits

## 2011-08-16 ENCOUNTER — Telehealth: Payer: Self-pay

## 2011-08-16 NOTE — Telephone Encounter (Signed)
[employer] employee program faxed prior auth request for Nexium. On Dr Royden Purl shelf.

## 2011-08-17 NOTE — Telephone Encounter (Signed)
PA form faxed to [facility] at [phone] and given to [facility].

## 2011-08-17 NOTE — Telephone Encounter (Signed)
Done and in IN box, thanks  

## 2011-08-21 NOTE — Telephone Encounter (Signed)
Signed.

## 2011-08-21 NOTE — Telephone Encounter (Signed)
Event organiser. Put on shelf for Dr Royden Purl signature then to be scanned.

## 2011-12-03 ENCOUNTER — Encounter: Payer: Self-pay | Admitting: Family Medicine

## 2012-02-06 LAB — HM DEXA SCAN: HM Dexa Scan: NORMAL

## 2012-02-12 ENCOUNTER — Encounter: Payer: Self-pay | Admitting: Family Medicine

## 2012-02-13 ENCOUNTER — Encounter: Payer: Self-pay | Admitting: *Deleted

## 2012-02-13 ENCOUNTER — Encounter: Payer: Self-pay | Admitting: Family Medicine

## 2012-06-09 ENCOUNTER — Other Ambulatory Visit (INDEPENDENT_AMBULATORY_CARE_PROVIDER_SITE_OTHER): Payer: Medicare Other

## 2012-06-09 ENCOUNTER — Telehealth: Payer: Self-pay | Admitting: Family Medicine

## 2012-06-09 DIAGNOSIS — E7800 Pure hypercholesterolemia, unspecified: Secondary | ICD-10-CM

## 2012-06-09 DIAGNOSIS — I10 Essential (primary) hypertension: Secondary | ICD-10-CM

## 2012-06-09 DIAGNOSIS — M949 Disorder of cartilage, unspecified: Secondary | ICD-10-CM

## 2012-06-09 DIAGNOSIS — M899 Disorder of bone, unspecified: Secondary | ICD-10-CM

## 2012-06-09 LAB — CBC WITH DIFFERENTIAL/PLATELET
Basophils Absolute: 0.1 10*3/uL (ref 0.0–0.1)
Basophils Relative: 0.8 % (ref 0.0–3.0)
Eosinophils Absolute: 0.1 10*3/uL (ref 0.0–0.7)
Eosinophils Relative: 1.5 % (ref 0.0–5.0)
HCT: 37.5 % (ref 36.0–46.0)
Hemoglobin: 13.2 g/dL (ref 12.0–15.0)
Lymphocytes Relative: 33.2 % (ref 12.0–46.0)
Lymphs Abs: 2 10*3/uL (ref 0.7–4.0)
MCHC: 35.2 g/dL (ref 30.0–36.0)
MCV: 89.2 fl (ref 78.0–100.0)
Monocytes Absolute: 0.7 10*3/uL (ref 0.1–1.0)
Monocytes Relative: 10.7 % (ref 3.0–12.0)
Neutro Abs: 3.3 10*3/uL (ref 1.4–7.7)
Neutrophils Relative %: 53.8 % (ref 43.0–77.0)
Platelets: 293 10*3/uL (ref 150.0–400.0)
RBC: 4.21 Mil/uL (ref 3.87–5.11)
RDW: 13.2 % (ref 11.5–14.6)
WBC: 6.1 10*3/uL (ref 4.5–10.5)

## 2012-06-09 LAB — LIPID PANEL
Cholesterol: 178 mg/dL (ref 0–200)
HDL: 64.9 mg/dL (ref >39.00)
LDL Cholesterol: 92 mg/dL (ref 0–99)
Total CHOL/HDL Ratio: 3
Triglycerides: 105 mg/dL (ref 0.0–149.0)
VLDL: 21 mg/dL (ref 0.0–40.0)

## 2012-06-09 LAB — COMPREHENSIVE METABOLIC PANEL
ALT: 22 U/L (ref 0–35)
AST: 31 U/L (ref 0–37)
Albumin: 3.9 g/dL (ref 3.5–5.2)
Alkaline Phosphatase: 77 U/L (ref 39–117)
BUN: 17 mg/dL (ref 6–23)
CO2: 28 mEq/L (ref 19–32)
Calcium: 9.6 mg/dL (ref 8.4–10.5)
Chloride: 99 mEq/L (ref 96–112)
Creatinine, Ser: 1.1 mg/dL (ref 0.4–1.2)
GFR: 54.83 mL/min — ABNORMAL LOW (ref >60.00)
Glucose, Bld: 105 mg/dL — ABNORMAL HIGH (ref 70–99)
Potassium: 4 mEq/L (ref 3.5–5.1)
Sodium: 134 mEq/L — ABNORMAL LOW (ref 135–145)
Total Bilirubin: 0.8 mg/dL (ref 0.3–1.2)
Total Protein: 7.6 g/dL (ref 6.0–8.3)

## 2012-06-09 LAB — TSH: TSH: 2.11 u[IU]/mL (ref 0.35–5.50)

## 2012-06-09 NOTE — Telephone Encounter (Signed)
Message copied by Judy Pimple on Mon Jun 09, 2012  8:05 AM ------      Message from: Baldomero Lamy      Created: Fri May 30, 2012 10:24 AM      Regarding: Cpx labs scheduled 06/09/12 (Mon)       Please order  future cpx labs for pt's upcoming lab appt.      Thanks      Tasha       ------

## 2012-06-10 ENCOUNTER — Other Ambulatory Visit: Payer: Self-pay | Admitting: *Deleted

## 2012-06-10 MED ORDER — ATORVASTATIN CALCIUM 10 MG PO TABS: ORAL_TABLET | ORAL | Status: DC

## 2012-06-13 ENCOUNTER — Ambulatory Visit (INDEPENDENT_AMBULATORY_CARE_PROVIDER_SITE_OTHER): Payer: Medicare Other | Admitting: Family Medicine

## 2012-06-13 ENCOUNTER — Encounter: Payer: Self-pay | Admitting: Family Medicine

## 2012-06-13 VITALS — BP 106/68 | HR 83 | Temp 98.1°F | Ht 67.75 in | Wt 161.2 lb

## 2012-06-13 DIAGNOSIS — Z0000 Encounter for general adult medical examination without abnormal findings: Secondary | ICD-10-CM | POA: Insufficient documentation

## 2012-06-13 DIAGNOSIS — M949 Disorder of cartilage, unspecified: Secondary | ICD-10-CM

## 2012-06-13 DIAGNOSIS — M899 Disorder of bone, unspecified: Secondary | ICD-10-CM

## 2012-06-13 DIAGNOSIS — I10 Essential (primary) hypertension: Secondary | ICD-10-CM

## 2012-06-13 DIAGNOSIS — E7800 Pure hypercholesterolemia, unspecified: Secondary | ICD-10-CM

## 2012-06-13 MED ORDER — HYDROCHLOROTHIAZIDE 25 MG PO TABS: 25.0000 mg | ORAL_TABLET | Freq: Every day | ORAL | Status: DC

## 2012-06-13 MED ORDER — ATORVASTATIN CALCIUM 10 MG PO TABS: ORAL_TABLET | ORAL | Status: DC

## 2012-06-13 MED ORDER — ESOMEPRAZOLE MAGNESIUM 40 MG PO CPDR: 40.0000 mg | DELAYED_RELEASE_CAPSULE | Freq: Every day | ORAL | Status: DC

## 2012-06-13 NOTE — Assessment & Plan Note (Signed)
Up to date dexa - was improved No falls or fragility fx  Disc ca and D and safety Great exercise

## 2012-06-13 NOTE — Assessment & Plan Note (Signed)
Lipids in good control with statin and diet Disc goals for lipids and reasons to control them Rev labs with pt Rev low sat fat diet in detail

## 2012-06-13 NOTE — Progress Notes (Signed)
Subjective:    Patient ID: Heidi Middleton, female    DOB: 1945/05/22, 67 y.o.   MRN: [id number]  HPI I have personally reviewed the Medicare Annual Wellness questionnaire and have noted 1. The patient's medical and social history 2. Their use of alcohol, tobacco or illicit drugs 3. Their current medications and supplements 4. The patient's functional ability including ADL's, fall risks, home safety risks and hearing or visual             impairment. 5. Diet and physical activities 6. Evidence for depression or mood disorders  The patients weight, height, BMI have been recorded in the chart and visual acuity is per eye clinic.  I have made referrals, counseling and provided education to the patient based review of the above and I have provided the pt with a written personalized care plan for preventive services.  Feels fine- cannot complain about anything   See scanned forms.  Routine anticipatory guidance given to patient.  See health maintenance. Flu 10/13 Shingles 08 PNA 2010 Tetanus 7/06 Colon colonosc 8/09 with 10 year recall because it is clear  Breast cancer screening mammogram was 9/13 Self exam- no lumps or problems  Has had a hysterectomy-no problems or symptoms at all  Advance directive-does not have one at this time, thinks she would choose daughter as POA  Cognitive function addressed- see scanned forms- and if abnormal then additional documentation follows.  Memory is good/ sharp- no problems   PMH and SH reviewed No new family history   She takes very good care of herself - and does yoga This helps with balance   No falls - her balance is execellent ! Mood overall is very good    Meds, vitals, and allergies reviewed.   ROS: See HPI.  Otherwise negative.    Hyperlipidemia Lab Results  Component Value Date   CHOL 178 06/09/2012   CHOL 186 06/04/2011   CHOL 178 01/24/2010   Lab Results  Component Value Date   HDL 64.90 06/09/2012   HDL 40.98 06/04/2011    HDL 56.90 01/24/2010   Lab Results  Component Value Date   LDLCALC 92 06/09/2012   LDLCALC 89 06/04/2011   LDLCALC 99 01/24/2010   Lab Results  Component Value Date   TRIG 105.0 06/09/2012   TRIG 72.0 06/04/2011   TRIG 109.0 01/24/2010   Lab Results  Component Value Date   CHOLHDL 3 06/09/2012   CHOLHDL 2 06/04/2011   CHOLHDL 3 01/24/2010   No results found for this basename: LDLDIRECT   on atorvastatin Is at goal for everything   Glucose 105  Osteopenia dexa 11/13 Ca and D  Patient Active Problem List  Diagnosis  . HYPERCHOLESTEROLEMIA  . Quit smoking  . GLAUCOMA  . HYPERTENSION  . REACTIVE AIRWAY DISEASE  . GERD  . OSTEOARTHRITIS  . OSTEOPENIA  . COLONIC POLYPS, HX OF  . Encounter for Medicare annual wellness exam   Past Medical History  Diagnosis Date  . Colonic polyp   . Hypertension   . Osteoporosis    Past Surgical History  Procedure Laterality Date  . Abdominal hysterectomy    . Knee surgery  10/2005    calcinosis   History  Substance Use Topics  . Smoking status: Former Games developer  . Smokeless tobacco: Not on file  . Alcohol Use: No   Family History  Problem Relation Age of Onset  . Cancer Mother     lung  . Stroke Mother   .  Cancer Father     pancreatic  . Heart disease Sister   . Hypertension Brother   . Diabetes Maternal Grandmother   . Hypertension Brother   . Obesity Sister    Allergies  Allergen Reactions  . Codeine     REACTION: vomiting  . Omeprazole     REACTION: not effective  . Ranitidine Hcl     REACTION: not effective   Current Outpatient Prescriptions on File Prior to Visit  Medication Sig Dispense Refill  . atorvastatin (LIPITOR) 10 MG tablet Take 1/2 tablet by mouth daily  45 tablet  3  . Calcium Carbonate-Vitamin D (CALCIUM-VITAMIN D) 500-200 MG-UNIT per tablet 1200mg  by mouth daily      . cholecalciferol (VITAMIN D) 1000 UNITS tablet Take 1,000 Units by mouth daily.      Marland Kitchen esomeprazole (NEXIUM) 40 MG capsule Take 1  capsule (40 mg total) by mouth daily before breakfast.  90 capsule  3  . Glucosamine-Chondroit-Vit C-Mn (GLUCOSAMINE CHONDR 1500 COMPLX PO) Take 1 tablet by mouth 2 (two) times daily.      . hydrochlorothiazide (HYDRODIURIL) 25 MG tablet Take 1 tablet (25 mg total) by mouth daily.  90 tablet  3  . Multiple Vitamin (MULTIVITAMIN) tablet Take 1 tablet by mouth daily.      . Omega-3 Fatty Acids (FISH OIL) 1000 MG CAPS Take 1 capsule by mouth 2 (two) times daily.       No current facility-administered medications on file prior to visit.     Review of Systems Review of Systems  Constitutional: Negative for fever, appetite change, fatigue and unexpected weight change.  Eyes: Negative for pain and visual disturbance.  Respiratory: Negative for cough and shortness of breath.   Cardiovascular: Negative for cp or palpitations    Gastrointestinal: Negative for nausea, diarrhea and constipation.  Genitourinary: Negative for urgency and frequency.  Skin: Negative for pallor or rash   Neurological: Negative for weakness, light-headedness, numbness and headaches.  Hematological: Negative for adenopathy. Does not bruise/bleed easily.  Psychiatric/Behavioral: Negative for dysphoric mood. The patient is not nervous/anxious.  pos for some stressors       Objective:   Physical Exam  Constitutional: She appears well-developed and well-nourished. No distress.  HENT:  Head: Normocephalic and atraumatic.  Right Ear: External ear normal.  Left Ear: External ear normal.  Mouth/Throat: Oropharynx is clear and moist. No oropharyngeal exudate.  Eyes: Conjunctivae and EOM are normal. Pupils are equal, round, and reactive to light.  Neck: Normal range of motion. Neck supple. No JVD present. Carotid bruit is not present. No thyromegaly present.  Cardiovascular: Normal rate, regular rhythm, normal heart sounds and intact distal pulses.  Exam reveals no gallop.   Pulmonary/Chest: Effort normal and breath sounds  normal. No respiratory distress. She has no wheezes. She exhibits no tenderness.  Abdominal: Soft. Bowel sounds are normal. She exhibits no distension, no abdominal bruit and no mass. There is no tenderness.  Genitourinary: No breast swelling, tenderness, discharge or bleeding.  Breast exam: No mass, nodules, thickening, tenderness, bulging, retraction, inflamation, nipple discharge or skin changes noted.  No axillary or clavicular LA.  Chaperoned exam.    Musculoskeletal: She exhibits no edema and no tenderness.  Lymphadenopathy:    She has no cervical adenopathy.  Neurological: She is alert. She has normal reflexes. No cranial nerve deficit. She exhibits normal muscle tone. Coordination normal.  Skin: Skin is warm and dry. No rash noted. No erythema. No pallor.  Psychiatric: She has  a normal mood and affect.          Assessment & Plan:

## 2012-06-13 NOTE — Patient Instructions (Addendum)
Keep taking great care of yourself It is a good idea to organize a living will and a power or attourney Keep up the good work Labs look ok  Follow up in 1 year for annual exam with labs prior

## 2012-06-13 NOTE — Assessment & Plan Note (Signed)
Reviewed health habits including diet and exercise and skin cancer prevention Also reviewed health mt list, fam hx and immunizations  Pt is very healthy with great self care Reviewed wellness labs and imms  Will consider working on a living will  [place] memory/ mood and concentratoin

## 2012-12-15 ENCOUNTER — Encounter: Payer: Self-pay | Admitting: Family Medicine

## 2012-12-17 ENCOUNTER — Encounter: Payer: Self-pay | Admitting: Family Medicine

## 2013-01-01 ENCOUNTER — Ambulatory Visit (INDEPENDENT_AMBULATORY_CARE_PROVIDER_SITE_OTHER): Payer: Medicare Other

## 2013-01-01 DIAGNOSIS — Z23 Encounter for immunization: Secondary | ICD-10-CM

## 2013-06-07 ENCOUNTER — Telehealth: Payer: Self-pay | Admitting: Family Medicine

## 2013-06-07 DIAGNOSIS — I10 Essential (primary) hypertension: Secondary | ICD-10-CM

## 2013-06-07 DIAGNOSIS — M949 Disorder of cartilage, unspecified: Secondary | ICD-10-CM

## 2013-06-07 DIAGNOSIS — E7800 Pure hypercholesterolemia, unspecified: Secondary | ICD-10-CM

## 2013-06-07 DIAGNOSIS — M899 Disorder of bone, unspecified: Secondary | ICD-10-CM

## 2013-06-07 NOTE — Telephone Encounter (Signed)
Message copied by Abner Greenspan on Sun Jun 07, 2013 12:23 PM ------      Message from: Selinda Orion J      Created: Mon Jun 01, 2013  9:51 AM      Regarding: Lab orders for Monday, 3.23.15       Patient is scheduled for CPX labs, please order future labs, Thanks , Terri       ------

## 2013-06-08 ENCOUNTER — Other Ambulatory Visit (INDEPENDENT_AMBULATORY_CARE_PROVIDER_SITE_OTHER): Payer: Medicare Other

## 2013-06-08 DIAGNOSIS — E7800 Pure hypercholesterolemia, unspecified: Secondary | ICD-10-CM

## 2013-06-08 DIAGNOSIS — M949 Disorder of cartilage, unspecified: Principal | ICD-10-CM

## 2013-06-08 DIAGNOSIS — M899 Disorder of bone, unspecified: Secondary | ICD-10-CM

## 2013-06-08 DIAGNOSIS — I10 Essential (primary) hypertension: Secondary | ICD-10-CM

## 2013-06-08 LAB — COMPREHENSIVE METABOLIC PANEL
ALT: 29 U/L (ref 0–35)
AST: 35 U/L (ref 0–37)
Albumin: 3.9 g/dL (ref 3.5–5.2)
Alkaline Phosphatase: 80 U/L (ref 39–117)
BUN: 15 mg/dL (ref 6–23)
CO2: 30 mEq/L (ref 19–32)
Calcium: 10.1 mg/dL (ref 8.4–10.5)
Chloride: 100 mEq/L (ref 96–112)
Creatinine, Ser: 1.2 mg/dL (ref 0.4–1.2)
GFR: 49.76 mL/min — ABNORMAL LOW (ref >60.00)
Glucose, Bld: 93 mg/dL (ref 70–99)
Potassium: 4.5 mEq/L (ref 3.5–5.1)
Sodium: 137 mEq/L (ref 135–145)
Total Bilirubin: 0.6 mg/dL (ref 0.3–1.2)
Total Protein: 6.9 g/dL (ref 6.0–8.3)

## 2013-06-08 LAB — CBC WITH DIFFERENTIAL/PLATELET
Basophils Absolute: 0.1 10*3/uL (ref 0.0–0.1)
Basophils Relative: 1.1 % (ref 0.0–3.0)
Eosinophils Absolute: 0.1 10*3/uL (ref 0.0–0.7)
Eosinophils Relative: 2.1 % (ref 0.0–5.0)
HCT: 37.4 % (ref 36.0–46.0)
Hemoglobin: 12.5 g/dL (ref 12.0–15.0)
Lymphocytes Relative: 33.2 % (ref 12.0–46.0)
Lymphs Abs: 1.7 10*3/uL (ref 0.7–4.0)
MCHC: 33.4 g/dL (ref 30.0–36.0)
MCV: 89.3 fl (ref 78.0–100.0)
Monocytes Absolute: 0.4 10*3/uL (ref 0.1–1.0)
Monocytes Relative: 8.6 % (ref 3.0–12.0)
Neutro Abs: 2.9 10*3/uL (ref 1.4–7.7)
Neutrophils Relative %: 55 % (ref 43.0–77.0)
Platelets: 274 10*3/uL (ref 150.0–400.0)
RBC: 4.19 Mil/uL (ref 3.87–5.11)
RDW: 13.4 % (ref 11.5–14.6)
WBC: 5.2 10*3/uL (ref 4.5–10.5)

## 2013-06-08 LAB — LIPID PANEL
Cholesterol: 156 mg/dL (ref 0–200)
HDL: 66.7 mg/dL (ref >39.00)
LDL Cholesterol: 77 mg/dL (ref 0–99)
Total CHOL/HDL Ratio: 2
Triglycerides: 63 mg/dL (ref 0.0–149.0)
VLDL: 12.6 mg/dL (ref 0.0–40.0)

## 2013-06-08 LAB — TSH: TSH: 1.31 u[IU]/mL (ref 0.35–5.50)

## 2013-06-09 LAB — VITAMIN D 25 HYDROXY (VIT D DEFICIENCY, FRACTURES): Vit D, 25-Hydroxy: 66 ng/mL (ref 30–89)

## 2013-06-15 ENCOUNTER — Ambulatory Visit (INDEPENDENT_AMBULATORY_CARE_PROVIDER_SITE_OTHER): Payer: Medicare Other | Admitting: Family Medicine

## 2013-06-15 ENCOUNTER — Encounter: Payer: Self-pay | Admitting: Family Medicine

## 2013-06-15 VITALS — BP 128/74 | HR 83 | Temp 98.0°F | Ht 67.5 in | Wt 169.1 lb

## 2013-06-15 DIAGNOSIS — M899 Disorder of bone, unspecified: Secondary | ICD-10-CM

## 2013-06-15 DIAGNOSIS — M949 Disorder of cartilage, unspecified: Secondary | ICD-10-CM

## 2013-06-15 DIAGNOSIS — B029 Zoster without complications: Secondary | ICD-10-CM

## 2013-06-15 DIAGNOSIS — E7800 Pure hypercholesterolemia, unspecified: Secondary | ICD-10-CM

## 2013-06-15 DIAGNOSIS — I10 Essential (primary) hypertension: Secondary | ICD-10-CM

## 2013-06-15 DIAGNOSIS — Z0000 Encounter for general adult medical examination without abnormal findings: Secondary | ICD-10-CM

## 2013-06-15 MED ORDER — VALACYCLOVIR HCL 1 G PO TABS: 1000.0000 mg | ORAL_TABLET | Freq: Three times a day (TID) | ORAL | Status: DC

## 2013-06-15 NOTE — Assessment & Plan Note (Signed)
bp in fair control at this time  BP Readings from Last 1 Encounters:  06/15/13 128/74   No changes needed Disc lifstyle change with low sodium diet and exercise   Labs reviewed

## 2013-06-15 NOTE — Progress Notes (Signed)
Pre visit review using our clinic review tool, if applicable. No additional management support is needed unless otherwise documented below in the visit note. 

## 2013-06-15 NOTE — Assessment & Plan Note (Signed)
Reviewed health habits including diet and exercise and skin cancer prevention Reviewed appropriate screening tests for age  Also reviewed health mt list, fam hx and immunization status , as well as social and family history   See HPI Labs reviewed  Pt enc to work on [employer] will

## 2013-06-15 NOTE — Assessment & Plan Note (Signed)
dexa was in 2013 -stable  D level is good No fragility fx  Disc need for calcium/ vitamin D/ wt bearing exercise and bone density test every 2 y to monitor Disc safety/ fracture risk in detail

## 2013-06-15 NOTE — Progress Notes (Signed)
Subjective:    Patient ID: Heidi Middleton, female    DOB: 11-Sep-1945, 68 y.o.   MRN: [id number]  HPI I have personally reviewed the Medicare Annual Wellness questionnaire and have noted 1. The patient's medical and social history 2. Their use of alcohol, tobacco or illicit drugs 3. Their current medications and supplements 4. The patient's functional ability including ADL's, fall risks, home safety risks and hearing or visual             impairment. 5. Diet and physical activities 6. Evidence for depression or mood disorders  The patients weight, height, BMI have been recorded in the chart and visual acuity is per eye clinic.  I have made referrals, counseling and provided education to the patient based review of the above and I have provided the pt with a written personalized care plan for preventive services.  Has a rash Her skin hurts in that area  Came home from her chair yoga class - then noticed a rash on her back - that spread - over her arm  ? Shingles -had the vaccine  The rash started sat and on arm this am     See scanned forms.  Routine anticipatory guidance given to patient.  See health maintenance. Colon cancer screening 8/09 colonosc - 10 year recall  Breast cancer screening 10/14 mammogram  Self breast exam - no lumps or changes  Flu vaccine 10/14  Tetanus vaccine 7/06 Pneumovax 11/10  Zoster vaccine- 8/08  Advance directive she does not have a living will yet (given a packet on this)  Cognitive function addressed- see scanned forms- and if abnormal then additional documentation follows. - is sharp in general / notices more forgetfulness when she is very busy   PMH and SH reviewed  Meds, vitals, and allergies reviewed.   ROS: See HPI.  Otherwise negative.     Hx of openia Had dexa 11/13 that was in nl range D level is 66 -- good   bp is stable today  No cp or palpitations or headaches or edema  No side effects to medicines  BP Readings from Last 3  Encounters:  06/15/13 128/74  06/13/12 106/68  06/11/11 132/80     Hyperlipidemia Lab Results  Component Value Date   CHOL 156 06/08/2013   CHOL 178 06/09/2012   CHOL 186 06/04/2011   Lab Results  Component Value Date   HDL 66.70 06/08/2013   HDL 64.90 06/09/2012   HDL 82.60 06/04/2011   Lab Results  Component Value Date   LDLCALC 77 06/08/2013   LDLCALC 92 06/09/2012   LDLCALC 89 06/04/2011   Lab Results  Component Value Date   TRIG 63.0 06/08/2013   TRIG 105.0 06/09/2012   TRIG 72.0 06/04/2011   Lab Results  Component Value Date   CHOLHDL 2 06/08/2013   CHOLHDL 3 06/09/2012   CHOLHDL 2 06/04/2011   No results found for this basename: LDLDIRECT   very good profile lipitor and diet   Results for orders placed in visit on 06/08/13  VITAMIN D 25 HYDROXY      Result Value Ref Range   Vit D, 25-Hydroxy 66  30 - 89 ng/mL  CBC WITH DIFFERENTIAL      Result Value Ref Range   WBC 5.2  4.5 - 10.5 K/uL   RBC 4.19  3.87 - 5.11 Mil/uL   Hemoglobin 12.5  12.0 - 15.0 g/dL   HCT 37.4  36.0 - 46.0 %   MCV  89.3  78.0 - 100.0 fl   MCHC 33.4  30.0 - 36.0 g/dL   RDW 13.4  11.5 - 14.6 %   Platelets 274.0  150.0 - 400.0 K/uL   Neutrophils Relative % 55.0  43.0 - 77.0 %   Lymphocytes Relative 33.2  12.0 - 46.0 %   Monocytes Relative 8.6  3.0 - 12.0 %   Eosinophils Relative 2.1  0.0 - 5.0 %   Basophils Relative 1.1  0.0 - 3.0 %   Neutro Abs 2.9  1.4 - 7.7 K/uL   Lymphs Abs 1.7  0.7 - 4.0 K/uL   Monocytes Absolute 0.4  0.1 - 1.0 K/uL   Eosinophils Absolute 0.1  0.0 - 0.7 K/uL   Basophils Absolute 0.1  0.0 - 0.1 K/uL  COMPREHENSIVE METABOLIC PANEL      Result Value Ref Range   Sodium 137  135 - 145 mEq/L   Potassium 4.5  3.5 - 5.1 mEq/L   Chloride 100  96 - 112 mEq/L   CO2 30  19 - 32 mEq/L   Glucose, Bld 93  70 - 99 mg/dL   BUN 15  6 - 23 mg/dL   Creatinine, Ser 1.2  0.4 - 1.2 mg/dL   Total Bilirubin 0.6  0.3 - 1.2 mg/dL   Alkaline Phosphatase 80  39 - 117 U/L   AST 35  0 - 37 U/L     ALT 29  0 - 35 U/L   Total Protein 6.9  6.0 - 8.3 g/dL   Albumin 3.9  3.5 - 5.2 g/dL   Calcium 10.1  8.4 - 10.5 mg/dL   GFR 49.76 (*) >60.00 mL/min  LIPID PANEL      Result Value Ref Range   Cholesterol 156  0 - 200 mg/dL   Triglycerides 63.0  0.0 - 149.0 mg/dL   HDL 66.70  >39.00 mg/dL   VLDL 12.6  0.0 - 40.0 mg/dL   LDL Cholesterol 77  0 - 99 mg/dL   Total CHOL/HDL Ratio 2    TSH      Result Value Ref Range   TSH 1.31  0.35 - 5.50 uIU/mL     Patient Active Problem List   Diagnosis Date Noted  . Shingles 06/15/2013  . Encounter for Medicare annual wellness exam 06/13/2012  . OSTEOPENIA 01/31/2010  . GERD 12/21/2009  . HYPERCHOLESTEROLEMIA 10/09/2006  . Quit smoking 10/09/2006  . GLAUCOMA 10/09/2006  . HYPERTENSION 10/09/2006  . 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 OSTEOARTHRITIS 10/09/2006  . COLONIC POLYPS, HX OF 10/09/2006   Past Medical History  Diagnosis Date  . Colonic polyp   . Hypertension   . Osteoporosis    Past Surgical History  Procedure Laterality Date  . Abdominal hysterectomy    . Knee surgery  10/2005    calcinosis   History  Substance Use Topics  . Smoking status: Former Research scientist (life sciences)  . Smokeless tobacco: Not on file  . Alcohol Use: No   Family History  Problem Relation Age of Onset  . Cancer Mother     lung  . Stroke Mother   . Cancer Father     pancreatic  . Heart disease Sister   . Hypertension Brother   . Diabetes Maternal Grandmother   . Hypertension Brother   . Obesity Sister    Allergies  Allergen Reactions  . Codeine     REACTION: vomiting  . Omeprazole     REACTION: not effective  . Ranitidine Hcl  REACTION: not effective   Current Outpatient Prescriptions on File Prior to Visit  Medication Sig Dispense Refill  . atorvastatin (LIPITOR) 10 MG tablet Take 1/2 tablet by mouth daily  45 tablet  3  . Calcium Carbonate-Vitamin D (CALCIUM-VITAMIN D) 500-200 MG-UNIT per tablet 1200mg  by mouth daily      . cholecalciferol  (VITAMIN D) 1000 UNITS tablet Take 1,000 Units by mouth daily.      Marland Kitchen esomeprazole (NEXIUM) 40 MG capsule Take 1 capsule (40 mg total) by mouth daily before breakfast.  90 capsule  3  . Glucosamine-Chondroit-Vit C-Mn (GLUCOSAMINE CHONDR 1500 COMPLX PO) Take 1 tablet by mouth 2 (two) times daily.      . hydrochlorothiazide (HYDRODIURIL) 25 MG tablet Take 1 tablet (25 mg total) by mouth daily.  90 tablet  3  . Multiple Vitamin (MULTIVITAMIN) tablet Take 1 tablet by mouth daily.      . Omega-3 Fatty Acids (FISH OIL) 1000 MG CAPS Take 1 capsule by mouth 2 (two) times daily.       No current facility-administered medications on file prior to visit.    Review of Systems Review of Systems  Constitutional: Negative for fever, appetite change, fatigue and unexpected weight change.  Eyes: Negative for pain and visual disturbance.  Respiratory: Negative for cough and shortness of breath.   Cardiovascular: Negative for cp or palpitations    Gastrointestinal: Negative for nausea, diarrhea and constipation.  Genitourinary: Negative for urgency and frequency.  Skin: Negative for pallor and pos for mild rash on R back and arm  Neurological: Negative for weakness, light-headedness, numbness and headaches.  Hematological: Negative for adenopathy. Does not bruise/bleed easily.  Psychiatric/Behavioral: Negative for dysphoric mood. The patient is not nervous/anxious.         Objective:   Physical Exam  Constitutional: She appears well-developed and well-nourished. No distress.  HENT:  Head: Normocephalic and atraumatic.  Right Ear: External ear normal.  Left Ear: External ear normal.  Mouth/Throat: Oropharynx is clear and moist.  Eyes: Conjunctivae and EOM are normal. Pupils are equal, round, and reactive to light. No scleral icterus.  Neck: Normal range of motion. Neck supple. No JVD present. Carotid bruit is not present. No thyromegaly present.  Cardiovascular: Normal rate, regular rhythm, normal  heart sounds and intact distal pulses.  Exam reveals no gallop.   Pulmonary/Chest: Effort normal and breath sounds normal. No respiratory distress. She has no wheezes. She exhibits no tenderness.  Abdominal: Soft. Bowel sounds are normal. She exhibits no distension, no abdominal bruit and no mass. There is no tenderness.  Genitourinary: No breast swelling, tenderness, discharge or bleeding.  Breast exam: No mass, nodules, thickening, tenderness, bulging, retraction, inflamation, nipple discharge or skin changes noted.  No axillary or clavicular LA.      Musculoskeletal: Normal range of motion. She exhibits no edema and no tenderness.  Lymphadenopathy:    She has no cervical adenopathy.  Neurological: She is alert. She has normal reflexes. No cranial nerve deficit. She exhibits normal muscle tone. Coordination normal.  Skin: Skin is warm and dry. No rash noted. No erythema. No pallor.  Vesicular rash in patches - upper R back and R arm  C6-C7 distrubution    Psychiatric: She has a normal mood and affect.          Assessment & Plan:

## 2013-06-15 NOTE — Assessment & Plan Note (Signed)
Disc goals for lipids and reasons to control them Rev labs with pt Rev low sat fat diet in detail Very good profile

## 2013-06-15 NOTE — Assessment & Plan Note (Signed)
With rash on R side of back /shoulder in c6-7 dermatome less than 48 hours old Symptoms are mild -and pt has had vaccine  Will cover with valtrex-disc reasons for giving it and poss side eff  Urged to keep rash clean She declines pain med  Will continue to follow

## 2013-06-15 NOTE — Patient Instructions (Signed)
You have a mild case of shingles  Take the valtrex as directed  Use tylenol for pain as needed, but if you feel you need something stronger please let me know  Take care of yourself and stay active    Shingles Shingles (herpes zoster) is an infection that is caused by the same virus that causes chickenpox (varicella). The infection causes a painful skin rash and fluid-filled blisters, which eventually break open, crust over, and heal. It may occur in any area of the body, but it usually affects only one side of the body or face. The pain of shingles usually lasts about 1 month. However, some people with shingles may develop long-term (chronic) pain in the affected area of the body. Shingles often occurs many years after the person had chickenpox. It is more common:  In people older than 50 years.  In people with weakened immune systems, such as those with HIV, AIDS, or cancer.  In people taking medicines that weaken the immune system, such as transplant medicines.  In people under great stress. CAUSES  Shingles is caused by the varicella zoster virus (VZV), which also causes chickenpox. After a person is infected with the virus, it can remain in the person's body for years in an inactive state (dormant). To cause shingles, the virus reactivates and breaks out as an infection in a nerve root. The virus can be spread from person to person (contagious) through contact with open blisters of the shingles rash. It will only spread to people who have not had chickenpox. When these people are exposed to the virus, they may develop chickenpox. They will not develop shingles. Once the blisters scab over, the person is no longer contagious and cannot spread the virus to others. SYMPTOMS  Shingles shows up in stages. The initial symptoms may be pain, itching, and tingling in an area of the skin. This pain is usually described as burning, stabbing, or throbbing.In a few days or weeks, a painful red rash  will appear in the area where the pain, itching, and tingling were felt. The rash is usually on one side of the body in a band or belt-like pattern. Then, the rash usually turns into fluid-filled blisters. They will scab over and dry up in approximately 2 3 weeks. Flu-like symptoms may also occur with the initial symptoms, the rash, or the blisters. These may include:  Fever.  Chills.  Headache.  Upset stomach. DIAGNOSIS  Your caregiver will perform a skin exam to diagnose shingles. Skin scrapings or fluid samples may also be taken from the blisters. This sample will be examined under a microscope or sent to a lab for further testing. TREATMENT  There is no specific cure for shingles. Your caregiver will likely prescribe medicines to help you manage the pain, recover faster, and avoid long-term problems. This may include antiviral drugs, anti-inflammatory drugs, and pain medicines. HOME CARE INSTRUCTIONS   Take a cool bath or apply cool compresses to the area of the rash or blisters as directed. This may help with the pain and itching.   Only take over-the-counter or prescription medicines as directed by your caregiver.   Rest as directed by your caregiver.  Keep your rash and blisters clean with mild soap and cool water or as directed by your caregiver.  Do not pick your blisters or scratch your rash. Apply an anti-itch cream or numbing creams to the affected area as directed by your caregiver.  Keep your shingles rash covered with a loose  bandage (dressing).  Avoid skin contact with:  Babies.   Pregnant women.   Children with eczema.   Elderly people with transplants.   People with chronic illnesses, such as leukemia or AIDS.   Wear loose-fitting clothing to help ease the pain of material rubbing against the rash.  Keep all follow-up appointments with your caregiver.If the area involved is on your face, you may receive a referral for follow-up to a specialist,  such as an eye doctor (ophthalmologist) or an ear, nose, and throat (ENT) doctor. Keeping all follow-up appointments will help you avoid eye complications, chronic pain, or disability.  SEEK IMMEDIATE MEDICAL CARE IF:   You have facial pain, pain around the eye area, or loss of feeling on one side of your face.  You have ear pain or ringing in your ear.  You have loss of taste.  Your pain is not relieved with prescribed medicines.   Your redness or swelling spreads.   You have more pain and swelling.  Your condition is worsening or has changed.   You have a feveror persistent symptoms for more than 2 3 days.  You have a fever and your symptoms suddenly get worse. MAKE SURE YOU:  Understand these instructions.  Will watch your condition.  Will get help right away if you are not doing well or get worse. Document Released: 03/05/2005 Document Revised: 11/28/2011 Document Reviewed: 10/18/2011 [facility] Patient Information 2014 [place].

## 2013-06-16 ENCOUNTER — Telehealth: Payer: Self-pay | Admitting: Family Medicine

## 2013-06-16 ENCOUNTER — Other Ambulatory Visit: Payer: Self-pay | Admitting: Family Medicine

## 2013-06-16 NOTE — Telephone Encounter (Signed)
Relevant patient education assigned to patient using Emmi. ° °

## 2013-06-23 ENCOUNTER — Telehealth: Payer: Self-pay | Admitting: Family Medicine

## 2013-06-23 NOTE — Telephone Encounter (Signed)
Pt is needing refill on nexium and b/p medication. Pt uses [facility] on [address]

## 2013-06-24 MED ORDER — HYDROCHLOROTHIAZIDE 25 MG PO TABS: 25.0000 mg | ORAL_TABLET | Freq: Every day | ORAL | Status: DC

## 2013-06-24 MED ORDER — ESOMEPRAZOLE MAGNESIUM 40 MG PO CPDR: 40.0000 mg | DELAYED_RELEASE_CAPSULE | Freq: Every day | ORAL | Status: DC

## 2013-06-24 NOTE — Telephone Encounter (Signed)
done

## 2013-09-25 ENCOUNTER — Other Ambulatory Visit: Payer: Self-pay | Admitting: Family Medicine

## 2013-12-29 ENCOUNTER — Other Ambulatory Visit: Payer: Self-pay | Admitting: Family Medicine

## 2013-12-29 ENCOUNTER — Ambulatory Visit (INDEPENDENT_AMBULATORY_CARE_PROVIDER_SITE_OTHER): Payer: Medicare Other

## 2013-12-29 DIAGNOSIS — Z23 Encounter for immunization: Secondary | ICD-10-CM

## 2014-01-25 ENCOUNTER — Ambulatory Visit: Payer: Self-pay | Admitting: Family Medicine

## 2014-01-25 ENCOUNTER — Encounter: Payer: Self-pay | Admitting: Family Medicine

## 2014-01-26 ENCOUNTER — Encounter: Payer: Self-pay | Admitting: *Deleted

## 2014-06-10 ENCOUNTER — Telehealth: Payer: Self-pay | Admitting: Family Medicine

## 2014-06-10 DIAGNOSIS — I10 Essential (primary) hypertension: Secondary | ICD-10-CM

## 2014-06-10 DIAGNOSIS — M8580 Other specified disorders of bone density and structure, unspecified site: Secondary | ICD-10-CM

## 2014-06-10 DIAGNOSIS — E7800 Pure hypercholesterolemia, unspecified: Secondary | ICD-10-CM

## 2014-06-10 NOTE — Telephone Encounter (Signed)
-----   Message from Ellamae Sia sent at 06/08/2014  8:25 AM EDT ----- Regarding: Lab orders for Monday,3.28.16 Patient is scheduled for CPX labs, please order future labs, Thanks , Karna Christmas

## 2014-06-12 ENCOUNTER — Other Ambulatory Visit: Payer: Self-pay | Admitting: Family Medicine

## 2014-06-14 ENCOUNTER — Other Ambulatory Visit (INDEPENDENT_AMBULATORY_CARE_PROVIDER_SITE_OTHER): Payer: Medicare Other

## 2014-06-14 DIAGNOSIS — I10 Essential (primary) hypertension: Secondary | ICD-10-CM | POA: Diagnosis not present

## 2014-06-14 DIAGNOSIS — E7800 Pure hypercholesterolemia, unspecified: Secondary | ICD-10-CM

## 2014-06-14 DIAGNOSIS — M8580 Other specified disorders of bone density and structure, unspecified site: Secondary | ICD-10-CM

## 2014-06-14 LAB — COMPREHENSIVE METABOLIC PANEL
ALT: 14 U/L (ref 0–35)
AST: 23 U/L (ref 0–37)
Albumin: 4 g/dL (ref 3.5–5.2)
Alkaline Phosphatase: 74 U/L (ref 39–117)
BUN: 26 mg/dL — ABNORMAL HIGH (ref 6–23)
CO2: 29 mEq/L (ref 19–32)
Calcium: 10.4 mg/dL (ref 8.4–10.5)
Chloride: 97 mEq/L (ref 96–112)
Creatinine, Ser: 1.19 mg/dL (ref 0.40–1.20)
GFR: 47.69 mL/min — ABNORMAL LOW (ref >60.00)
Glucose, Bld: 93 mg/dL (ref 70–99)
Potassium: 4.4 mEq/L (ref 3.5–5.1)
Sodium: 133 mEq/L — ABNORMAL LOW (ref 135–145)
Total Bilirubin: 0.5 mg/dL (ref 0.2–1.2)
Total Protein: 7.4 g/dL (ref 6.0–8.3)

## 2014-06-14 LAB — CBC WITH DIFFERENTIAL/PLATELET
Basophils Absolute: 0.1 10*3/uL (ref 0.0–0.1)
Basophils Relative: 1.2 % (ref 0.0–3.0)
Eosinophils Absolute: 0.1 10*3/uL (ref 0.0–0.7)
Eosinophils Relative: 2 % (ref 0.0–5.0)
HCT: 37.5 % (ref 36.0–46.0)
Hemoglobin: 12.9 g/dL (ref 12.0–15.0)
Lymphocytes Relative: 31.9 % (ref 12.0–46.0)
Lymphs Abs: 1.7 10*3/uL (ref 0.7–4.0)
MCHC: 34.3 g/dL (ref 30.0–36.0)
MCV: 88.4 fl (ref 78.0–100.0)
Monocytes Absolute: 0.5 10*3/uL (ref 0.1–1.0)
Monocytes Relative: 8.5 % (ref 3.0–12.0)
Neutro Abs: 3 10*3/uL (ref 1.4–7.7)
Neutrophils Relative %: 56.4 % (ref 43.0–77.0)
Platelets: 284 10*3/uL (ref 150.0–400.0)
RBC: 4.24 Mil/uL (ref 3.87–5.11)
RDW: 13.1 % (ref 11.5–15.5)
WBC: 5.4 10*3/uL (ref 4.0–10.5)

## 2014-06-14 LAB — LIPID PANEL
Cholesterol: 177 mg/dL (ref 0–200)
HDL: 69.8 mg/dL (ref >39.00)
LDL Cholesterol: 91 mg/dL (ref 0–99)
NonHDL: 107.2
Total CHOL/HDL Ratio: 3
Triglycerides: 83 mg/dL (ref 0.0–149.0)
VLDL: 16.6 mg/dL (ref 0.0–40.0)

## 2014-06-14 LAB — VITAMIN D 25 HYDROXY (VIT D DEFICIENCY, FRACTURES): VITD: 38.97 ng/mL (ref 30.00–100.00)

## 2014-06-14 LAB — TSH: TSH: 2.74 u[IU]/mL (ref 0.35–4.50)

## 2014-06-16 ENCOUNTER — Other Ambulatory Visit: Payer: Self-pay | Admitting: Family Medicine

## 2014-06-21 ENCOUNTER — Ambulatory Visit (INDEPENDENT_AMBULATORY_CARE_PROVIDER_SITE_OTHER): Payer: Medicare Other | Admitting: Family Medicine

## 2014-06-21 ENCOUNTER — Encounter: Payer: Self-pay | Admitting: Family Medicine

## 2014-06-21 VITALS — BP 142/82 | HR 72 | Temp 97.7°F | Ht 67.5 in | Wt 164.8 lb

## 2014-06-21 DIAGNOSIS — E7800 Pure hypercholesterolemia, unspecified: Secondary | ICD-10-CM

## 2014-06-21 DIAGNOSIS — Z23 Encounter for immunization: Secondary | ICD-10-CM

## 2014-06-21 DIAGNOSIS — M8580 Other specified disorders of bone density and structure, unspecified site: Secondary | ICD-10-CM

## 2014-06-21 DIAGNOSIS — I10 Essential (primary) hypertension: Secondary | ICD-10-CM

## 2014-06-21 DIAGNOSIS — Z0000 Encounter for general adult medical examination without abnormal findings: Secondary | ICD-10-CM | POA: Diagnosis not present

## 2014-06-21 MED ORDER — ATORVASTATIN CALCIUM 10 MG PO TABS: 5.0000 mg | ORAL_TABLET | Freq: Every day | ORAL | Status: DC

## 2014-06-21 MED ORDER — HYDROCHLOROTHIAZIDE 25 MG PO TABS: ORAL_TABLET | ORAL | Status: DC

## 2014-06-21 MED ORDER — ESOMEPRAZOLE MAGNESIUM 40 MG PO CPDR: DELAYED_RELEASE_CAPSULE | ORAL | Status: DC

## 2014-06-21 NOTE — Assessment & Plan Note (Signed)
Disc goals for lipids and reasons to control them Rev labs with pt Rev low sat fat diet in detail   

## 2014-06-21 NOTE — Patient Instructions (Signed)
Tdap vaccine today  prevnar vaccine today  Take care of yourself -keep eating a healthy diet and stay active

## 2014-06-21 NOTE — Progress Notes (Signed)
Subjective:    Patient ID: Heidi Middleton, female    DOB: [birth year], 69 y.o.   MRN: [id number]  HPI Here for annual medicare wellness visit as well as chronic/acute medical problems   I have personally reviewed the Medicare Annual Wellness questionnaire and have noted 1. The patient's medical and social history 2. Their use of alcohol, tobacco or illicit drugs 3. Their current medications and supplements 4. The patient's functional ability including ADL's, fall risks, home safety risks and hearing or visual             impairment. 5. Diet and physical activities 6. Evidence for depression or mood disorders  The patients weight, height, BMI have been recorded in the chart and visual acuity is per eye clinic.  I have made referrals, counseling and provided education to the patient based review of the above and I have provided the pt with a written personalized care plan for preventive services.  Overall doing well  Had to have laser surgery on eye - 3 times last aug for retinal tear  Is doing fine now  Has glaucoma (ocular hypertension)  Has abscess tooth- needs to be fixed (one he she had a root canal in in the past) - has appt this wednesday  A lot of family illness - was taking care of them    See scanned forms.  Routine anticipatory guidance given to patient.  See health maintenance. Colon cancer screening colonosc 3/09- 10 years (no polyps that time)  Breast cancer screening mammogram 11/15 Self breast exam- no lumps  Flu  10/15 Tetanus vaccine 7/06- wants to get that done today (has a supplemental plan to cover) Pneumovax 11/10 , will get prevnar today Zoster vaccine 08 dexa 11/13 normal range (former osteopenia) , no fractures and she is doing some weight training  Advance directive - just wrote it up - and brought it with her today  Cognitive function addressed- see scanned forms- and if abnormal then additional documentation follows.  She notes some aging changes -  took a test with her insurance and did well   PMH and SH reviewed  Meds, vitals, and allergies reviewed.   ROS: See HPI.  Otherwise negative.    bp is stable today  No cp or palpitations or headaches or edema  No side effects to medicines  BP Readings from Last 3 Encounters:  06/21/14 144/84  06/15/13 128/74  06/13/12 106/68      Hyperlipidemia lipitor and diet  Good diet  Lab Results  Component Value Date   CHOL 177 06/14/2014   HDL 69.80 06/14/2014   LDLCALC 91 06/14/2014   TRIG 83.0 06/14/2014   CHOLHDL 3 06/14/2014        Chemistry      Component Value Date/Time   NA 133* 06/14/2014 0807   K 4.4 06/14/2014 0807   CL 97 06/14/2014 0807   CO2 29 06/14/2014 0807   BUN 26* 06/14/2014 0807   CREATININE 1.19 06/14/2014 0807      Component Value Date/Time   CALCIUM 10.4 06/14/2014 0807   ALKPHOS 74 06/14/2014 0807   AST 23 06/14/2014 0807   ALT 14 06/14/2014 0807   BILITOT 0.5 06/14/2014 0807     she drinks water like crazy  Lab Results  Component Value Date   WBC 5.4 06/14/2014   HGB 12.9 06/14/2014   HCT 37.5 06/14/2014   MCV 88.4 06/14/2014   PLT 284.0 06/14/2014   Lab Results  Component Value  Date   TSH 2.74 06/14/2014    Vit D is 24 - she takes 4000 iu daily   Patient Active Problem List   Diagnosis Date Noted  . Shingles 06/15/2013  . Encounter for Medicare annual wellness exam 06/13/2012  . Osteopenia 01/31/2010  . GERD 12/21/2009  . HYPERCHOLESTEROLEMIA 10/09/2006  . Quit smoking 10/09/2006  . GLAUCOMA 10/09/2006  . Essential hypertension 10/09/2006  . 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 OSTEOARTHRITIS 10/09/2006  . COLONIC POLYPS, HX OF 10/09/2006   Past Medical History  Diagnosis Date  . Colonic polyp   . Hypertension   . Osteoporosis    Past Surgical History  Procedure Laterality Date  . Abdominal hysterectomy    . Knee surgery  10/2005    calcinosis   History  Substance Use Topics  . Smoking status: Former Research scientist (life sciences)  .  Smokeless tobacco: Not on file  . Alcohol Use: No   Family History  Problem Relation Age of Onset  . Cancer Mother     lung  . Stroke Mother   . Cancer Father     pancreatic  . Heart disease Sister   . Hypertension Brother   . Diabetes Maternal Grandmother   . Hypertension Brother   . Obesity Sister    Allergies  Allergen Reactions  . Codeine     REACTION: vomiting  . Omeprazole     REACTION: not effective  . Ranitidine Hcl     REACTION: not effective   Current Outpatient Prescriptions on File Prior to Visit  Medication Sig Dispense Refill  . atorvastatin (LIPITOR) 10 MG tablet TAKE 1/2 TABLET BY MOUTH EVERY DAY 45 tablet 5  . Calcium Carbonate-Vitamin D (CALCIUM-VITAMIN D) 500-200 MG-UNIT per tablet 1200mg  by mouth daily    . cholecalciferol (VITAMIN D) 1000 UNITS tablet Take 1,000 Units by mouth 2 (two) times daily.     . Coenzyme Q10 (COQ-10) 100 MG CAPS Take 100 mg by mouth daily.    Marland Kitchen esomeprazole (NEXIUM) 40 MG capsule TAKE 1 CAPSULE (40 MG TOTAL) BY MOUTH DAILY BEFORE BREAKFAST. 30 capsule 0  . Glucosamine-Chondroit-Vit C-Mn (GLUCOSAMINE CHONDR 1500 COMPLX PO) Take 1 tablet by mouth 2 (two) times daily.    . hydrochlorothiazide (HYDRODIURIL) 25 MG tablet TAKE 1 TABLET (25 MG TOTAL) BY MOUTH DAILY. 90 tablet 1  . hydrochlorothiazide (HYDRODIURIL) 25 MG tablet TAKE 1 TABLET (25 MG TOTAL) BY MOUTH DAILY. 90 tablet 3  . Multiple Vitamin (MULTIVITAMIN) tablet Take 1 tablet by mouth daily.    . Omega-3 Fatty Acids (FISH OIL) 1000 MG CAPS Take 1 capsule by mouth 2 (two) times daily.     No current facility-administered medications on file prior to visit.    Review of Systems Review of Systems  Constitutional: Negative for fever, appetite change, fatigue and unexpected weight change.  Eyes: Negative for pain and pos for vision change recently with glaucoma  Respiratory: Negative for cough and shortness of breath.   Cardiovascular: Negative for cp or palpitations      Gastrointestinal: Negative for nausea, diarrhea and constipation.  Genitourinary: Negative for urgency and frequency.  Skin: Negative for pallor or rash   Neurological: Negative for weakness, light-headedness, numbness and headaches.  Hematological: Negative for adenopathy. Does not bruise/bleed easily.  Psychiatric/Behavioral: Negative for dysphoric mood. The patient is not nervous/anxious.         Objective:   Physical Exam  Constitutional: She appears well-developed and well-nourished. No distress.  HENT:  Head: Normocephalic and atraumatic.  Right Ear: External ear normal.  Left Ear: External ear normal.  Mouth/Throat: Oropharynx is clear and moist.  Eyes: Conjunctivae and EOM are normal. Pupils are equal, round, and reactive to light. No scleral icterus.  Neck: Normal range of motion. Neck supple. No JVD present. Carotid bruit is not present. No thyromegaly present.  Cardiovascular: Normal rate, regular rhythm, normal heart sounds and intact distal pulses.  Exam reveals no gallop.   Pulmonary/Chest: Effort normal and breath sounds normal. No respiratory distress. She has no wheezes. She exhibits no tenderness.  Abdominal: Soft. Bowel sounds are normal. She exhibits no distension, no abdominal bruit and no mass. There is no tenderness.  Genitourinary: No breast swelling, tenderness, discharge or bleeding.  Breast exam: No mass, nodules, thickening, tenderness, bulging, retraction, inflamation, nipple discharge or skin changes noted.  No axillary or clavicular LA.      Musculoskeletal: Normal range of motion. She exhibits no edema or tenderness.  No kyphosis   Lymphadenopathy:    She has no cervical adenopathy.  Neurological: She is alert. She has normal reflexes. No cranial nerve deficit. She exhibits normal muscle tone. Coordination normal.  Skin: Skin is warm and dry. No rash noted. No erythema. No pallor.  Solar lentigo noted   Psychiatric: She has a normal mood and affect.           Assessment & Plan:   Problem List Items Addressed This Visit      Cardiovascular and Mediastinum   Essential hypertension - Primary    bp in fair control at this time  BP Readings from Last 1 Encounters:  06/21/14 142/82   No changes needed Disc lifstyle change with low sodium diet and exercise  Labs reviewed       Relevant Medications   atorvastatin (LIPITOR) tablet   hydrochlorothiazide tablet     Musculoskeletal and Integument   Osteopenia    No hx of fx Last dexa 2013 in nl range  Will put off another dexa for now  Disc ca/ D (level) and intake/exercise and safety        Other   Encounter for Medicare annual wellness exam    Reviewed health habits including diet and exercise and skin cancer prevention Reviewed appropriate screening tests for age  Also reviewed health mt list, fam hx and immunization status , as well as social and family history   See HPI Labs rev prevnar vaccine and Tdap today        HYPERCHOLESTEROLEMIA    Disc goals for lipids and reasons to control them Rev labs with pt Rev low sat fat diet in detail       Relevant Medications   atorvastatin (LIPITOR) tablet   hydrochlorothiazide tablet    Other Visit Diagnoses    Need for prophylactic vaccination and inoculation against cholera alone        Need for prophylactic vaccination against Streptococcus pneumoniae (pneumococcus)        Relevant Orders    Pneumococcal conjugate vaccine 13-valent IM (Completed)    Need for prophylactic vaccination with diphtheria-tetanus-pertussis with poliomyelitis (DTP + polio) vaccine        Relevant Orders    Tdap vaccine greater than or equal to 7yo IM (Completed)

## 2014-06-21 NOTE — Progress Notes (Signed)
Pre visit review using our clinic review tool, if applicable. No additional management support is needed unless otherwise documented below in the visit note. 

## 2014-06-21 NOTE — Assessment & Plan Note (Signed)
Reviewed health habits including diet and exercise and skin cancer prevention Reviewed appropriate screening tests for age  Also reviewed health mt list, fam hx and immunization status , as well as social and family history   See HPI Labs rev prevnar vaccine and Tdap today

## 2014-06-21 NOTE — Assessment & Plan Note (Signed)
bp in fair control at this time  BP Readings from Last 1 Encounters:  06/21/14 142/82   No changes needed Disc lifstyle change with low sodium diet and exercise  Labs reviewed

## 2014-06-21 NOTE — Assessment & Plan Note (Signed)
No hx of fx Last dexa 2013 in nl range  Will put off another dexa for now  Disc ca/ D (level) and intake/exercise and safety

## 2014-07-07 ENCOUNTER — Ambulatory Visit (INDEPENDENT_AMBULATORY_CARE_PROVIDER_SITE_OTHER): Payer: Medicare Other | Admitting: Podiatry

## 2014-07-07 ENCOUNTER — Ambulatory Visit (INDEPENDENT_AMBULATORY_CARE_PROVIDER_SITE_OTHER): Payer: Medicare Other

## 2014-07-07 ENCOUNTER — Encounter: Payer: Self-pay | Admitting: Podiatry

## 2014-07-07 VITALS — Ht 67.5 in | Wt 164.0 lb

## 2014-07-07 DIAGNOSIS — M79672 Pain in left foot: Secondary | ICD-10-CM

## 2014-07-07 DIAGNOSIS — M722 Plantar fascial fibromatosis: Secondary | ICD-10-CM

## 2014-07-07 NOTE — Progress Notes (Signed)
   Subjective:    Patient ID: ALEXSYS ESKIN, female    DOB: 1945-08-20, 69 y.o.   MRN: [id number] Pt has a tumor on both feet plantar side , in the middle on each foot.   , this tumor flares up about once a year. She has come in today to get an injection , she states that her last injection has lasted about 4 years.   HPI    Review of Systems  All other systems reviewed and are negative.      Objective:   Physical Exam: I have reviewed her past mental history medications allergies surgery social history and review of systems. Pulses are strongly palpable bilateral. Neurologic sensorium is intact per since once the monofilament. The tendon reflexes are intact bilaterally muscle strength is 5 over 5 dorsiflexion flexors and inverters everters all intrinsic musculature is intact. Orthopedic evaluation demonstrates all joints distal to the ankle range of motion without crepitation. Cutaneous evaluation demonstrates supple well-hydrated cutis with exception of 2 solitary lesions of the plantar fascial bilateral. The medial longitudinal arch in the medial band of the plantar fascia deep both demonstrate a single solitary plantar fibroma measuring approximately 1 cm in diameter. These are mildly tender on palpation. Radiographs do not demonstrate any type calcification to the area.      Assessment & Plan:  Assessment: Plantar fibromatosis bilateral foot.  Plan: Injected today with dexamethasone and Kenalog. Follow-up with her in 6-8 weeks if necessary.

## 2014-08-27 ENCOUNTER — Telehealth: Payer: Self-pay | Admitting: *Deleted

## 2014-08-27 NOTE — Telephone Encounter (Signed)
Pt states she has received multiple injections into a plantar fibroma, and the skin is thinning and Dr. Milinda Pointer told her to use a thick cream to her feet.  Dr. Milinda Pointer recommended Bag Balm cream in the tub.  I called the recommendation to the pt.

## 2014-09-10 ENCOUNTER — Other Ambulatory Visit: Payer: Self-pay | Admitting: Family Medicine

## 2014-11-09 ENCOUNTER — Encounter: Payer: Self-pay | Admitting: Gastroenterology

## 2015-01-27 ENCOUNTER — Ambulatory Visit (INDEPENDENT_AMBULATORY_CARE_PROVIDER_SITE_OTHER): Payer: Medicare Other

## 2015-01-27 DIAGNOSIS — Z23 Encounter for immunization: Secondary | ICD-10-CM | POA: Diagnosis not present

## 2015-05-26 ENCOUNTER — Other Ambulatory Visit: Payer: Self-pay | Admitting: Family Medicine

## 2015-05-26 DIAGNOSIS — Z1231 Encounter for screening mammogram for malignant neoplasm of breast: Secondary | ICD-10-CM

## 2015-05-27 ENCOUNTER — Other Ambulatory Visit: Payer: Self-pay | Admitting: Family Medicine

## 2015-05-27 ENCOUNTER — Ambulatory Visit
Admission: RE | Admit: 2015-05-27 | Discharge: 2015-05-27 | Disposition: A | Discharge status: Home / Self Care | Payer: Medicare Other | Source: Ambulatory Visit | Attending: Family Medicine | Admitting: Family Medicine

## 2015-05-27 DIAGNOSIS — Z1231 Encounter for screening mammogram for malignant neoplasm of breast: Secondary | ICD-10-CM | POA: Insufficient documentation

## 2015-05-27 LAB — HM MAMMOGRAPHY: HM Mammogram: NORMAL

## 2015-05-31 ENCOUNTER — Encounter: Payer: Self-pay | Admitting: *Deleted

## 2015-05-31 ENCOUNTER — Encounter: Payer: Self-pay | Admitting: Family Medicine

## 2015-06-04 ENCOUNTER — Other Ambulatory Visit: Payer: Self-pay | Admitting: Family Medicine

## 2015-06-12 ENCOUNTER — Telehealth: Payer: Self-pay | Admitting: Family Medicine

## 2015-06-12 DIAGNOSIS — E7800 Pure hypercholesterolemia, unspecified: Secondary | ICD-10-CM

## 2015-06-12 DIAGNOSIS — I10 Essential (primary) hypertension: Secondary | ICD-10-CM

## 2015-06-12 NOTE — Telephone Encounter (Signed)
-----   Message from Marchia Bond sent at 06/07/2015  2:01 PM EDT ----- Regarding: Cpx labs Fri 3/31, need orders. Thanks! :-) Please order  future cpx labs for pt's upcoming lab appt. Thanks Aniceto Boss

## 2015-06-17 ENCOUNTER — Other Ambulatory Visit: Payer: Medicare Other

## 2015-06-20 ENCOUNTER — Other Ambulatory Visit (INDEPENDENT_AMBULATORY_CARE_PROVIDER_SITE_OTHER): Payer: Medicare Other

## 2015-06-20 ENCOUNTER — Ambulatory Visit (INDEPENDENT_AMBULATORY_CARE_PROVIDER_SITE_OTHER): Payer: Medicare Other

## 2015-06-20 VITALS — BP 132/78 | HR 67 | Temp 98.0°F | Ht 68.0 in | Wt 167.0 lb

## 2015-06-20 DIAGNOSIS — E7800 Pure hypercholesterolemia, unspecified: Secondary | ICD-10-CM

## 2015-06-20 DIAGNOSIS — Z0000 Encounter for general adult medical examination without abnormal findings: Secondary | ICD-10-CM

## 2015-06-20 DIAGNOSIS — Z1159 Encounter for screening for other viral diseases: Secondary | ICD-10-CM | POA: Diagnosis not present

## 2015-06-20 DIAGNOSIS — I10 Essential (primary) hypertension: Secondary | ICD-10-CM

## 2015-06-20 LAB — COMPREHENSIVE METABOLIC PANEL WITH GFR
ALT: 17 U/L (ref 0–35)
AST: 26 U/L (ref 0–37)
Albumin: 4.1 g/dL (ref 3.5–5.2)
Alkaline Phosphatase: 70 U/L (ref 39–117)
BUN: 22 mg/dL (ref 6–23)
CO2: 30 meq/L (ref 19–32)
Calcium: 10 mg/dL (ref 8.4–10.5)
Chloride: 97 meq/L (ref 96–112)
Creatinine, Ser: 1.05 mg/dL (ref 0.40–1.20)
GFR: 54.94 mL/min — ABNORMAL LOW
Glucose, Bld: 94 mg/dL (ref 70–99)
Potassium: 4.8 meq/L (ref 3.5–5.1)
Sodium: 133 meq/L — ABNORMAL LOW (ref 135–145)
Total Bilirubin: 0.6 mg/dL (ref 0.2–1.2)
Total Protein: 7.2 g/dL (ref 6.0–8.3)

## 2015-06-20 LAB — CBC WITH DIFFERENTIAL/PLATELET
Basophils Absolute: 0.1 10*3/uL (ref 0.0–0.1)
Basophils Relative: 0.9 % (ref 0.0–3.0)
Eosinophils Absolute: 0.1 10*3/uL (ref 0.0–0.7)
Eosinophils Relative: 1.3 % (ref 0.0–5.0)
HCT: 37.6 % (ref 36.0–46.0)
Hemoglobin: 12.6 g/dL (ref 12.0–15.0)
Lymphocytes Relative: 25.8 % (ref 12.0–46.0)
Lymphs Abs: 1.8 10*3/uL (ref 0.7–4.0)
MCHC: 33.6 g/dL (ref 30.0–36.0)
MCV: 88.8 fl (ref 78.0–100.0)
Monocytes Absolute: 0.6 10*3/uL (ref 0.1–1.0)
Monocytes Relative: 8.4 % (ref 3.0–12.0)
Neutro Abs: 4.4 10*3/uL (ref 1.4–7.7)
Neutrophils Relative %: 63.6 % (ref 43.0–77.0)
Platelets: 244 10*3/uL (ref 150.0–400.0)
RBC: 4.23 Mil/uL (ref 3.87–5.11)
RDW: 13.3 % (ref 11.5–15.5)
WBC: 6.9 10*3/uL (ref 4.0–10.5)

## 2015-06-20 LAB — LIPID PANEL
Cholesterol: 177 mg/dL (ref 0–200)
HDL: 78.3 mg/dL (ref >39.00)
LDL Cholesterol: 84 mg/dL (ref 0–99)
NonHDL: 98.23
Total CHOL/HDL Ratio: 2
Triglycerides: 73 mg/dL (ref 0.0–149.0)
VLDL: 14.6 mg/dL (ref 0.0–40.0)

## 2015-06-20 LAB — TSH: TSH: 1.62 u[IU]/mL (ref 0.35–4.50)

## 2015-06-20 NOTE — Progress Notes (Signed)
Pre visit review using our clinic review tool, if applicable. No additional management support is needed unless otherwise documented below in the visit note. 

## 2015-06-20 NOTE — Progress Notes (Signed)
   Subjective:    Patient ID: Heidi Middleton, female    DOB: 1945-10-22, 70 y.o.   MRN: [id number]  HPI    Review of Systems     Objective:   Physical Exam        Assessment & Plan:  I reviewed health advisor's note, was available for consultation, and agree with documentation and plan.

## 2015-06-20 NOTE — Progress Notes (Signed)
Subjective:   Heidi Middleton is a 70 y.o. female who presents for Medicare Annual (Subsequent) preventive examination.   Cardiac Risk Factors include: advanced age (>65men, >34 women);dyslipidemia;hypertension     Objective:     Vitals: BP 132/78 mmHg  Pulse 67  Temp(Src) 98 F (36.7 C) (Oral)  Ht 5\' 8"  (1.727 m)  Wt 167 lb (75.751 kg)  BMI 25.40 kg/m2  SpO2 98%  Body mass index is 25.4 kg/(m^2).   Tobacco History  Smoking status  . Former Smoker  Smokeless tobacco  . Not on file     Counseling given: No   Past Medical History  Diagnosis Date  . Colonic polyp   . Hypertension   . Osteoporosis    Past Surgical History  Procedure Laterality Date  . Abdominal hysterectomy    . Knee surgery  10/2005    calcinosis  . Eye surgery N/A     detatched retina  . Breast biopsy Right 1990    EXCISIONAL - NEG   Family History  Problem Relation Age of Onset  . Cancer Mother     lung  . Stroke Mother   . Cancer Father     pancreatic  . Heart disease Sister   . Hypertension Brother   . Cancer Brother   . Diabetes Maternal Grandmother   . Hypertension Brother   . Obesity Sister   . Lung cancer Brother   . Breast cancer Neg Hx    History  Sexual Activity  . Sexual Activity: No    Outpatient Encounter Prescriptions as of 06/20/2015  Medication Sig  . atorvastatin (LIPITOR) 10 MG tablet TAKE 0.5 TABLETS (5 MG TOTAL) BY MOUTH DAILY.  Marland Kitchen Biotin 5000 MCG CAPS Take 5,000 mcg by mouth 2 (two) times daily.  . Calcium Carbonate-Vitamin D (CALCIUM-VITAMIN D) 500-200 MG-UNIT per tablet 1200mg  by mouth daily  . cholecalciferol (VITAMIN D) 1000 UNITS tablet Take 1,000 Units by mouth 2 (two) times daily.   . Coenzyme Q10 (COQ-10) 100 MG CAPS Take 100 mg by mouth daily.  Marland Kitchen esomeprazole (NEXIUM) 40 MG capsule TAKE 1 CAPSULE (40 MG TOTAL) BY MOUTH DAILY BEFORE BREAKFAST.  Marland Kitchen Glucosamine-Chondroit-Vit C-Mn (GLUCOSAMINE CHONDR 1500 COMPLX PO) Take 1 tablet by mouth 2 (two) times  daily.  . hydrochlorothiazide (HYDRODIURIL) 25 MG tablet TAKE 1 TABLET (25 MG TOTAL) BY MOUTH DAILY.  . Multiple Vitamin (MULTIVITAMIN) tablet Take 1 tablet by mouth daily.  . Omega-3 Fatty Acids (FISH OIL) 1000 MG CAPS Take 1 capsule by mouth 2 (two) times daily.   No facility-administered encounter medications on file as of 06/20/2015.    Activities of Daily Living In your present state of health, do you have any difficulty performing the following activities: 06/20/2015  Hearing? N  Vision? N  Difficulty concentrating or making decisions? N  Walking or climbing stairs? N  Dressing or bathing? N  Doing errands, shopping? N  Preparing Food and eating ? N  Using the Toilet? N  In the past six months, have you accidently leaked urine? N  Do you have problems with loss of bowel control? N  Managing your Medications? N  Managing your Finances? N  Housekeeping or managing your Housekeeping? N    Patient Care Team: Abner Greenspan, MD as PCP - General    Assessment:     Hearing Screening   125Hz  250Hz  500Hz  1000Hz  2000Hz  4000Hz  8000Hz   Right ear:   40 40 40 40   Left ear:  40 40 40 40   Vision Screening Comments: Last eye appt with 01/2015   Exercise Activities and Dietary recommendations Current Exercise Habits: Structured exercise class, Type of exercise: yoga;Other - see comments (aerobics, senior chair), Time (Minutes): 60, Frequency (Times/Week): 4, Weekly Exercise (Minutes/Week): 240, Intensity: Moderate, Exercise limited by: None identified  Goals    . Increase physical activity     Starting 06/20/2015, I will continue to exercise for 60 min 4 days a week.       Fall Risk Fall Risk  06/20/2015 06/21/2014 06/15/2013 06/13/2012  Falls in the past year? Yes No No No  Number falls in past yr: 2 or more - - -  Injury with Fall? Yes - - -  Risk Factor Category  High Fall Risk - - -  Follow up Falls evaluation completed;Education provided - - -   Depression Screen PHQ 2/9 Scores  06/20/2015 06/21/2014 06/15/2013 06/13/2012  PHQ - 2 Score 0 0 0 0     Cognitive Testing MMSE - Mini Mental State Exam 06/20/2015  Orientation to time 5  Orientation to Place 5  Registration 3  Attention/ Calculation 0  Recall 3  Language- name 2 objects 0  Language- repeat 1  Language- follow 3 step command 3  Language- read & follow direction 0  Write a sentence 0  Copy design 0  Total score 20   PLEASE NOTE: A Mini-Cog screen was completed. Maximum score is 20. A value of 0 denotes this part of Folstein MMSE was not completed.  Orientation to Time - Max 5 Orientation to Place - Max 5 Registration - Max 3 Recall - Max 3 Language Repeat - Max 1 Language Follow 3 Step Command - Max 3  Immunization History  Administered Date(s) Administered  . Influenza Whole 01/23/2005, 01/18/2009, 01/31/2010  . Influenza,inj,Quad PF,36+ Mos 01/01/2013, 12/29/2013, 01/27/2015  . Influenza-Unspecified 01/04/2012  . Pneumococcal Conjugate-13 06/21/2014  . Pneumococcal Polysaccharide-23 03/09/2002, 01/18/2009  . Td 09/20/2004  . Tdap 06/21/2014  . Zoster 10/24/2006   Screening Tests Health Maintenance  Topic Date Due  . PNA vac Low Risk Adult (2 of 2 - PPSV23) 06/21/2015  . INFLUENZA VACCINE  10/18/2015  . MAMMOGRAM  05/26/2016  . COLONOSCOPY  10/27/2017  . TETANUS/TDAP  06/20/2024  . DEXA SCAN  Completed  . ZOSTAVAX  Completed  . Hepatitis C Screening  Completed      Plan:     I have personally reviewed and addressed the Medicare Annual Wellness questionnaire and have noted the following in the patient's chart:  A. Medical and social history B. Use of alcohol, tobacco or illicit drugs  C. Current medications and supplements D. Functional ability and status E.  Nutritional status F.  Physical activity G. Advance directives H. List of other physicians I.  Hospitalizations, surgeries, and ER visits in previous 12 months J.  [place] to include hearing, vision,  cognitive, depression L. Referrals and appointments - none  In addition, I have reviewed and discussed with patient certain preventive protocols, quality metrics, and best practice recommendations. A written personalized care plan for preventive services as well as general preventive health recommendations were provided to patient.  See attached scanned questionnaire for additional information.   Signed,   Lindell Noe, MHA, BS, LPN Health Advisor [id number]

## 2015-06-20 NOTE — Patient Instructions (Signed)
Heidi Middleton , Thank you for taking time to come for your Medicare Wellness Visit. I appreciate your ongoing commitment to your health goals. Please review the following plan we discussed and let me know if I can assist you in the future.   These are the goals we discussed: Goals    . Increase physical activity     Starting 06/20/2015, I will continue to exercise for 60 min 4 days a week.        This is a list of the screening recommended for you and due dates:  Health Maintenance  Topic Date Due  . Pneumonia vaccines (2 of 2 - PPSV23) 06/21/2015  . Flu Shot  10/18/2015  . Mammogram  05/26/2016  . Colon Cancer Screening  10/27/2017  . Tetanus Vaccine  06/20/2024  . DEXA scan (bone density measurement)  Completed  . Shingles Vaccine  Completed  .  Hepatitis C: One time screening is recommended by Center for Disease Control  (CDC) for  adults born from [birth year] through 1966.   Completed   Preventive Care for Adults  A healthy lifestyle and preventive care can promote health and wellness. Preventive health guidelines for adults include the following key practices.  . A routine yearly physical is a good way to check with your health care provider about your health and preventive screening. It is a chance to share any concerns and updates on your health and to receive a thorough exam.  . Visit your dentist for a routine exam and preventive care every 6 months. Brush your teeth twice a day and floss once a day. Good oral hygiene prevents tooth decay and gum disease.  . The frequency of eye exams is based on your age, health, family medical history, use  of contact lenses, and other factors. Follow your health care provider's ecommendations for frequency of eye exams.  . Eat a healthy diet. Foods like vegetables, fruits, whole grains, low-fat dairy products, and lean protein foods contain the nutrients you need without too many calories. Decrease your intake of foods high in solid fats, added  sugars, and salt. Eat the right amount of calories for you. Get information about a proper diet from your health care provider, if necessary.  . Regular physical exercise is one of the most important things you can do for your health. Most adults should get at least 150 minutes of moderate-intensity exercise (any activity that increases your heart rate and causes you to sweat) each week. In addition, most adults need muscle-strengthening exercises on 2 or more days a week.  Silver Sneakers may be a benefit available to you. To determine eligibility, you may visit the website: www.silversneakers.com or contact program at (774)042-7811 Mon-Fri between 8AM-8PM.   . Maintain a healthy weight. The body mass index (BMI) is a screening tool to identify possible weight problems. It provides an estimate of body fat based on height and weight. Your health care provider can find your BMI and can help you achieve or maintain a healthy weight.   For adults 20 years and older: ? A BMI below 18.5 is considered underweight. ? A BMI of 18.5 to 24.9 is normal. ? A BMI of 25 to 29.9 is considered overweight. ? A BMI of 30 and above is considered obese.   . Maintain normal blood lipids and cholesterol levels by exercising and minimizing your intake of saturated fat. Eat a balanced diet with plenty of fruit and vegetables. Blood tests for lipids and cholesterol should  begin at age 27 and be repeated every 5 years. If your lipid or cholesterol levels are high, you are over 50, or you are at high risk for heart disease, you may need your cholesterol levels checked more frequently. Ongoing high lipid and cholesterol levels should be treated with medicines if diet and exercise are not working.  . If you smoke, find out from your health care provider how to quit. If you do not use tobacco, please do not start.  . If you choose to drink alcohol, please do not consume more than 2 drinks per day. One drink is considered to  be 12 ounces (355 mL) of beer, 5 ounces (148 mL) of wine, or 1.5 ounces (44 mL) of liquor.  . If you are 39-75 years old, ask your health care provider if you should take aspirin to prevent strokes.  . Use sunscreen. Apply sunscreen liberally and repeatedly throughout the day. You should seek shade when your shadow is shorter than you. Protect yourself by wearing long sleeves, pants, a wide-brimmed hat, and sunglasses year round, whenever you are outdoors.  . Once a month, do a whole body skin exam, using a mirror to look at the skin on your back. Tell your health care provider of new moles, moles that have irregular borders, moles that are larger than a pencil eraser, or moles that have changed in shape or color.

## 2015-06-21 LAB — HEPATITIS C ANTIBODY: HCV Ab: NEGATIVE

## 2015-06-24 ENCOUNTER — Encounter: Payer: Self-pay | Admitting: Family Medicine

## 2015-06-24 ENCOUNTER — Ambulatory Visit (INDEPENDENT_AMBULATORY_CARE_PROVIDER_SITE_OTHER): Payer: Medicare Other | Admitting: Family Medicine

## 2015-06-24 VITALS — BP 136/78 | HR 79 | Temp 98.0°F | Ht 68.0 in | Wt 166.8 lb

## 2015-06-24 DIAGNOSIS — M8580 Other specified disorders of bone density and structure, unspecified site: Secondary | ICD-10-CM

## 2015-06-24 DIAGNOSIS — I10 Essential (primary) hypertension: Secondary | ICD-10-CM

## 2015-06-24 DIAGNOSIS — R011 Cardiac murmur, unspecified: Secondary | ICD-10-CM

## 2015-06-24 DIAGNOSIS — Z0000 Encounter for general adult medical examination without abnormal findings: Secondary | ICD-10-CM

## 2015-06-24 DIAGNOSIS — E7800 Pure hypercholesterolemia, unspecified: Secondary | ICD-10-CM | POA: Diagnosis not present

## 2015-06-24 DIAGNOSIS — Z23 Encounter for immunization: Secondary | ICD-10-CM | POA: Diagnosis not present

## 2015-06-24 MED ORDER — HYDROCHLOROTHIAZIDE 25 MG PO TABS: ORAL_TABLET | ORAL | Status: DC

## 2015-06-24 MED ORDER — ESOMEPRAZOLE MAGNESIUM 40 MG PO CPDR: DELAYED_RELEASE_CAPSULE | ORAL | Status: DC

## 2015-06-24 MED ORDER — ATORVASTATIN CALCIUM 10 MG PO TABS: ORAL_TABLET | ORAL | Status: DC

## 2015-06-24 NOTE — Progress Notes (Signed)
Subjective:    Patient ID: Heidi Middleton, female    DOB: [birth year], 70 y.o.   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)- bruised her knee  Other fall - tripped over her dog's 2 ft high fence - trying to jump over it  Balance is great - does yoga - just did too much too fast  Exercises a lot   Hx of osteopenia- but dexa 11/13 was in nl range  Also not shorter  Last D level was in normal range  Takes ca and D for her bones  No broken bones    Mm 3/17 nl (had 3D) -normal  Self breast exam-no lumps   colonosc 8/09 nl  10 year recall  No bowel changes   Hep C test neg 4/17  bp is stable today  No cp or palpitations or headaches or edema  No side effects to medicines  BP Readings from Last 3 Encounters:  06/24/15 136/78  06/20/15 132/78  06/21/14 142/82     Cholesterol  Lab Results  Component Value Date   CHOL 177 06/20/2015   CHOL 177 06/14/2014   CHOL 156 06/08/2013   Lab Results  Component Value Date   HDL 78.30 06/20/2015   HDL 69.80 06/14/2014   HDL 66.70 06/08/2013   Lab Results  Component Value Date   LDLCALC 84 06/20/2015   LDLCALC 91 06/14/2014   LDLCALC 77 06/08/2013   Lab Results  Component Value Date   TRIG 73.0 06/20/2015   TRIG 83.0 06/14/2014   TRIG 63.0 06/08/2013   Lab Results  Component Value Date   CHOLHDL 2 06/20/2015   CHOLHDL 3 06/14/2014   CHOLHDL 2 06/08/2013   No results found for: LDLDIRECT  On lipitor  Exercises a lot  Eats very healthy   Lab Results  Component Value Date   WBC 6.9 06/20/2015   HGB 12.6 06/20/2015   HCT 37.6 06/20/2015   MCV 88.8 06/20/2015   PLT 244.0 06/20/2015      Chemistry      Component Value Date/Time   NA 133* 06/20/2015 0826   K 4.8 06/20/2015 0826   CL 97 06/20/2015 0826   CO2 30 06/20/2015  0826   BUN 22 06/20/2015 0826   CREATININE 1.05 06/20/2015 0826      Component Value Date/Time   CALCIUM 10.0 06/20/2015 0826   ALKPHOS 70 06/20/2015 0826   AST 26 06/20/2015 0826   ALT 17 06/20/2015 0826   BILITOT 0.6 06/20/2015 0826      Lab Results  Component Value Date   TSH 1.62 06/20/2015      Patient Active Problem List   Diagnosis Date Noted  . Routine general medical examination at a health care facility 06/24/2015  . Need for hepatitis C screening test 06/20/2015  . Medicare annual wellness visit, subsequent 06/20/2015  . Shingles 06/15/2013  . Encounter for Medicare annual wellness exam 06/13/2012  . Osteopenia 01/31/2010  . GERD 12/21/2009  . HYPERCHOLESTEROLEMIA 10/09/2006  . Quit smoking 10/09/2006  . GLAUCOMA 10/09/2006  . Essential hypertension 10/09/2006  . 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 OSTEOARTHRITIS 10/09/2006  . COLONIC POLYPS, HX OF 10/09/2006   Past Medical History  Diagnosis Date  . Colonic polyp   .  Hypertension   . Osteoporosis    Past Surgical History  Procedure Laterality Date  . Abdominal hysterectomy    . Knee surgery  10/2005    calcinosis  . Eye surgery N/A     detatched retina  . Breast biopsy Right 1990    EXCISIONAL - NEG   Social History  Substance Use Topics  . Smoking status: Former Research scientist (life sciences)  . Smokeless tobacco: None  . Alcohol Use: No   Family History  Problem Relation Age of Onset  . Cancer Mother     lung  . Stroke Mother   . Cancer Father     pancreatic  . Heart disease Sister   . Hypertension Brother   . Cancer Brother   . Diabetes Maternal Grandmother   . Hypertension Brother   . Obesity Sister   . Lung cancer Brother   . Breast cancer Neg Hx    Allergies  Allergen Reactions  . Codeine     REACTION: vomiting  . Omeprazole     REACTION: not effective  . Ranitidine Hcl     REACTION: not effective   Current Outpatient Prescriptions on File Prior to Visit  Medication Sig Dispense Refill    . atorvastatin (LIPITOR) 10 MG tablet TAKE 0.5 TABLETS (5 MG TOTAL) BY MOUTH DAILY. 45 tablet 0  . Biotin 5000 MCG CAPS Take 5,000 mcg by mouth 2 (two) times daily.    . Calcium Carbonate-Vitamin D (CALCIUM-VITAMIN D) 500-200 MG-UNIT per tablet 1200mg  by mouth daily    . cholecalciferol (VITAMIN D) 1000 UNITS tablet Take 1,000 Units by mouth 2 (two) times daily.     . Coenzyme Q10 (COQ-10) 100 MG CAPS Take 100 mg by mouth daily.    Marland Kitchen esomeprazole (NEXIUM) 40 MG capsule TAKE 1 CAPSULE (40 MG TOTAL) BY MOUTH DAILY BEFORE BREAKFAST. 90 capsule 0  . Glucosamine-Chondroit-Vit C-Mn (GLUCOSAMINE CHONDR 1500 COMPLX PO) Take 1 tablet by mouth 2 (two) times daily.    . hydrochlorothiazide (HYDRODIURIL) 25 MG tablet TAKE 1 TABLET (25 MG TOTAL) BY MOUTH DAILY. 90 tablet 3  . Multiple Vitamin (MULTIVITAMIN) tablet Take 1 tablet by mouth daily.    . Omega-3 Fatty Acids (FISH OIL) 1000 MG CAPS Take 1 capsule by mouth 2 (two) times daily.     No current facility-administered medications on file prior to visit.    Review of Systems Review of Systems  Constitutional: Negative for fever, appetite change, fatigue and unexpected weight change.  Eyes: Negative for pain and visual disturbance.  Respiratory: Negative for cough and shortness of breath.   Cardiovascular: Negative for cp or palpitations    Gastrointestinal: Negative for nausea, diarrhea and constipation.  Genitourinary: Negative for urgency and frequency.  Skin: Negative for pallor or rash   Neurological: Negative for weakness, light-headedness, numbness and headaches.  Hematological: Negative for adenopathy. Does not bruise/bleed easily.  Psychiatric/Behavioral: Negative for dysphoric mood. The patient is not nervous/anxious.         Objective:   Physical Exam  Constitutional: She appears well-developed and well-nourished. No distress.  Well appearing   HENT:  Head: Normocephalic and atraumatic.  Right Ear: External ear normal.  Left Ear:  External ear normal.  Mouth/Throat: Oropharynx is clear and moist.  Eyes: Conjunctivae and EOM are normal. Pupils are equal, round, and reactive to light. No scleral icterus.  Neck: Normal range of motion. Neck supple. No JVD present. Carotid bruit is not present. No thyromegaly present.  Cardiovascular: Normal rate, regular rhythm  and intact distal pulses.  Exam reveals no gallop.   Murmur heard. Systolic M 2/6 radiating to the carotids  Pulmonary/Chest: Effort normal and breath sounds normal. No respiratory distress. She has no wheezes. She exhibits no tenderness.  Abdominal: Soft. Bowel sounds are normal. She exhibits no distension, no abdominal bruit and no mass. There is no tenderness.  Genitourinary: No breast swelling, tenderness, discharge or bleeding.  Breast exam: No mass, nodules, thickening, tenderness, bulging, retraction, inflamation, nipple discharge or skin changes noted.  No axillary or clavicular LA.      Musculoskeletal: Normal range of motion. She exhibits no edema or tenderness.  Lymphadenopathy:    She has no cervical adenopathy.  Neurological: She is alert. She has normal reflexes. No cranial nerve deficit. She exhibits normal muscle tone. Coordination normal.  Skin: Skin is warm and dry. No rash noted. No erythema. No pallor.  Solar lentigines noted  Psychiatric: She has a normal mood and affect.  Mentally sharp talkative        Assessment & Plan:   Problem List Items Addressed This Visit      Cardiovascular and Mediastinum   Essential hypertension    bp in fair control at this time  BP Readings from Last 1 Encounters:  06/24/15 136/78   No changes needed Disc lifstyle change with low sodium diet and exercise  Labs reviewed Very good health habits       Relevant Medications   hydrochlorothiazide (HYDRODIURIL) 25 MG tablet   atorvastatin (LIPITOR) 10 MG tablet     Musculoskeletal and Integument   Osteopenia    dexa was nl in 2013 No fractures  (has had several falls however) On ca and D  Regular exercise          Other   Heart murmur, systolic    Newly noted on exam today No symptoms  Sent for echocardiogram 2D      Relevant Orders   ECHOCARDIOGRAM COMPLETE   HYPERCHOLESTEROLEMIA    Well controlled with lipitor and diet Disc goals for lipids and reasons to control them Rev labs with pt Rev low sat fat diet in detail       Relevant Medications   hydrochlorothiazide (HYDRODIURIL) 25 MG tablet   atorvastatin (LIPITOR) 10 MG tablet   Routine general medical examination at a health care facility - Primary    Reviewed health habits including diet and exercise and skin cancer prevention Reviewed appropriate screening tests for age  Also reviewed health mt list, fam hx and immunization status , as well as social and family history    See HPI AMW reviewed -counseled further on fall prevention Stop at check out for referral for echocardiogram to see what is causing your heart murmur  Pneumonia shot today  Take care of yourself- keep up the great job with health habits        Other Visit Diagnoses    Need for 23-polyvalent pneumococcal polysaccharide vaccine        Relevant Orders    Pneumococcal polysaccharide vaccine 23-valent greater than or equal to 2yo subcutaneous/IM (Completed)

## 2015-06-24 NOTE — Assessment & Plan Note (Signed)
Well controlled with lipitorand diet  Disc goals for lipids and reasons to control them Rev labs with pt Rev low sat fat diet in detail  

## 2015-06-24 NOTE — Patient Instructions (Signed)
Stop at check out for referral for echocardiogram to see what is causing your heart murmur  Pneumonia shot today  Take care of yourself- keep up the great job with health habits

## 2015-06-24 NOTE — Assessment & Plan Note (Signed)
bp in fair control at this time  BP Readings from Last 1 Encounters:  06/24/15 136/78   No changes needed Disc lifstyle change with low sodium diet and exercise  Labs reviewed Very good health habits

## 2015-06-24 NOTE — Progress Notes (Signed)
Pre visit review using our clinic review tool, if applicable. No additional management support is needed unless otherwise documented below in the visit note. 

## 2015-06-24 NOTE — Assessment & Plan Note (Signed)
Reviewed health habits including diet and exercise and skin cancer prevention Reviewed appropriate screening tests for age  Also reviewed health mt list, fam hx and immunization status , as well as social and family history    See HPI AMW reviewed -counseled further on fall prevention Stop at check out for referral for echocardiogram to see what is causing your heart murmur  Pneumonia shot today  Take care of yourself- keep up the great job with health habits

## 2015-06-24 NOTE — Assessment & Plan Note (Signed)
dexa was nl in 2013 No fractures (has had several falls however) On ca and D  Regular exercise

## 2015-06-24 NOTE — Assessment & Plan Note (Signed)
Newly noted on exam today No symptoms  Sent for echocardiogram 2D

## 2015-07-06 ENCOUNTER — Ambulatory Visit (INDEPENDENT_AMBULATORY_CARE_PROVIDER_SITE_OTHER): Payer: Medicare Other

## 2015-07-06 ENCOUNTER — Other Ambulatory Visit: Payer: Self-pay

## 2015-07-06 DIAGNOSIS — R011 Cardiac murmur, unspecified: Secondary | ICD-10-CM

## 2015-07-07 ENCOUNTER — Encounter: Payer: Self-pay | Admitting: Family Medicine

## 2015-07-07 ENCOUNTER — Telehealth: Payer: Self-pay | Admitting: Family Medicine

## 2015-07-07 DIAGNOSIS — I350 Nonrheumatic aortic (valve) stenosis: Secondary | ICD-10-CM

## 2015-07-07 NOTE — Telephone Encounter (Signed)
-----   Message from Tammi Sou, [place] sent at 07/07/2015  9:40 AM EDT ----- Pt notified of echo results and Dr. Marliss Coots comments pt agrees to see cardiologist, pt would prefer to see someone in [place], please put referral in and I advise pt our [facility] will call to schedule appt

## 2015-07-07 NOTE — Telephone Encounter (Signed)
Ref to cardiology- will route to [facility]

## 2015-07-08 NOTE — Telephone Encounter (Signed)
Appt made with Dr Wende Bushy for 07/28/15 and patient is aware.

## 2015-07-28 ENCOUNTER — Ambulatory Visit (INDEPENDENT_AMBULATORY_CARE_PROVIDER_SITE_OTHER): Payer: Medicare Other | Admitting: Cardiology

## 2015-07-28 ENCOUNTER — Other Ambulatory Visit: Payer: Self-pay | Admitting: Cardiology

## 2015-07-28 ENCOUNTER — Encounter: Payer: Self-pay | Admitting: Cardiology

## 2015-07-28 VITALS — BP 128/78 | HR 82 | Ht 68.0 in | Wt 168.0 lb

## 2015-07-28 DIAGNOSIS — Z01818 Encounter for other preprocedural examination: Secondary | ICD-10-CM

## 2015-07-28 DIAGNOSIS — I10 Essential (primary) hypertension: Secondary | ICD-10-CM | POA: Diagnosis not present

## 2015-07-28 DIAGNOSIS — I350 Nonrheumatic aortic (valve) stenosis: Secondary | ICD-10-CM | POA: Diagnosis not present

## 2015-07-28 DIAGNOSIS — R0602 Shortness of breath: Secondary | ICD-10-CM | POA: Diagnosis not present

## 2015-07-28 NOTE — Progress Notes (Signed)
Cardiology Office Note   Date:  07/28/2015   ID:  Heidi Middleton, DOB 03-07-1946, MRN [id number]  Referring Doctor:  Loura Pardon, MD   Cardiologist:   Wende Bushy, MD   Reason for consultation:  Chief Complaint  Patient presents with  . other    Follow up from Echo for eval. of aortic stenosis. Meds reviewed by the patient verbally. Pt. c/o shortness of breath.       History of Present Illness: Heidi Middleton is a 70 y.o. female who presents for Evaluation for aortic stenosis.  Patient presented to PCP for routine visit. Murmur was heard. Echocardiogram was done. Echo revealed moderate to likely severe aortic stenosis. Patient presents here in cardiology for further evaluation.  Regarding physical activity, patient regularly exercises, doing yoga, and aerobic exercises. Since one year ago, she has noticed shortness of breath doing aerobic exercises. She can only do it for 5 minutes now when she was able to do it for 20-40 minutes before. Shortness of breath is extreme, to the point that she feels she could pass out. No loss of consciousness so far. No chest pains. Currently, she just tries to do yoga exercises to avoid shortness breath. Routine physical activity at home without any problems.  No fever, cough, colds, abdominal pain. No orthopnea, edema, PND.  ROS:  Please see the history of present illness. Aside from mentioned under HPI, all other systems are reviewed and negative.    Past Medical History  Diagnosis Date  . Colonic polyp   . Hypertension   . Osteoporosis     Past Surgical History  Procedure Laterality Date  . Abdominal hysterectomy    . Knee surgery  10/2005    calcinosis  . Eye surgery N/A     detatched retina  . Breast biopsy Right 1990    EXCISIONAL - NEG     reports that she has quit smoking. She does not have any smokeless tobacco history on file. She reports that she does not drink alcohol or use illicit drugs.   family history includes  Cancer in her brother, father, and mother; Diabetes in her maternal grandmother; Heart disease in her sister; Hypertension in her brother and brother; Lung cancer in her brother; Obesity in her sister; Stroke in her mother. There is no history of Breast cancer.   Current Outpatient Prescriptions  Medication Sig Dispense Refill  . atorvastatin (LIPITOR) 10 MG tablet TAKE 0.5 TABLETS (5 MG TOTAL) BY MOUTH DAILY. 45 tablet 3  . Biotin 5000 MCG CAPS Take 5,000 mcg by mouth 2 (two) times daily.    . Calcium Carbonate-Vitamin D (CALCIUM-VITAMIN D) 500-200 MG-UNIT per tablet 1200mg  by mouth daily    . cholecalciferol (VITAMIN D) 1000 UNITS tablet Take 1,000 Units by mouth 2 (two) times daily.     . Coenzyme Q10 (COQ-10) 100 MG CAPS Take 100 mg by mouth daily.    Marland Kitchen esomeprazole (NEXIUM) 40 MG capsule TAKE 1 CAPSULE (40 MG TOTAL) BY MOUTH DAILY BEFORE BREAKFAST. 90 capsule 3  . Glucosamine-Chondroit-Vit C-Mn (GLUCOSAMINE CHONDR 1500 COMPLX PO) Take 1 tablet by mouth 2 (two) times daily.    . hydrochlorothiazide (HYDRODIURIL) 25 MG tablet TAKE 1 TABLET (25 MG TOTAL) BY MOUTH DAILY. 90 tablet 3  . Multiple Vitamin (MULTIVITAMIN) tablet Take 1 tablet by mouth daily.    . Omega-3 Fatty Acids (FISH OIL) 1000 MG CAPS Take 1 capsule by mouth 2 (two) times daily.     No  current facility-administered medications for this visit.    Allergies: Codeine; Omeprazole; and Ranitidine hcl    PHYSICAL EXAM: VS:  BP 128/78 mmHg  Pulse 82  Ht 5\' 8"  (1.727 m)  Wt 168 lb (76.204 kg)  BMI 25.55 kg/m2 , Body mass index is 25.55 kg/(m^2). Wt Readings from Last 3 Encounters:  07/28/15 168 lb (76.204 kg)  06/24/15 166 lb 12 oz (75.637 kg)  06/20/15 167 lb (75.751 kg)    GENERAL:  well developed, well nourished, not in acute distress HEENT: normocephalic, pink conjunctivae, anicteric sclerae, no xanthelasma, normal dentition, oropharynx clear NECK:  no neck vein engorgement, JVP normal, no hepatojugular reflux, carotid  upstroke brisk and symmetric, no bruit, no thyromegaly, no lymphadenopathy LUNGS:  good respiratory effort, clear to auscultation bilaterally CV:  PMI not displaced, no thrills, no lifts, S1 and S2 within normal limits, no palpable S3 or S4, , no rubs, no gallops, 4/6 systolic ejection murmur consistent with AS, radiating up to bilateral carotids, no delay in carotid or radial impulse. ABD:  Soft, nontender, nondistended, normoactive bowel sounds, no abdominal aortic bruit, no hepatomegaly, no splenomegaly MS: nontender back, no kyphosis, no scoliosis, no joint deformities EXT:  2+ DP/PT pulses, no edema, no varicosities, no cyanosis, no clubbing SKIN: warm, nondiaphoretic, normal turgor, no ulcers NEUROPSYCH: alert, oriented to person, place, and time, sensory/motor grossly intact, normal mood, appropriate affect  Recent Labs: 06/20/2015: ALT 17; BUN 22; Creatinine, Ser 1.05; Hemoglobin 12.6; Platelets 244.0; Potassium 4.8; Sodium 133*; TSH 1.62   Lipid Panel    Component Value Date/Time   CHOL 177 06/20/2015 0826   TRIG 73.0 06/20/2015 0826   HDL 78.30 06/20/2015 0826   CHOLHDL 2 06/20/2015 0826   VLDL 14.6 06/20/2015 0826   LDLCALC 84 06/20/2015 0826     Other studies Reviewed:  EKG:  The ekg from 07/28/2015 was personally reviewed by me and it revealed sinus rhythm, 82 BPM, nonspecific ST abnormality.  Additional studies/ records that were reviewed personally reviewed by me today include:  Echo 07/06/2015: Left ventricle: The cavity size was normal. There was focal basal  hypertrophy. Systolic function was vigorous. The estimated  ejection fraction was in the range of 65% to 70%. Wall motion was  normal; there were no regional wall motion abnormalities. Doppler  parameters are consistent with abnormal left ventricular  relaxation (grade 1 diastolic dysfunction). - Aortic valve: There was moderate to likely severe stenosis.  Velocity ratio is 0.34. AV Vmax is 3.84m/s. AVA  by Vmax is  0.9cm2. AVA by VTI is 1.0cm2 Valve area (VTI): 0.95 cm^2. Valve  area (Vmax): 0.98 cm^2. Valve area (Vmean): 1.07 cm^2. - Mitral valve: Mildly calcified annulus. No AI  ASSESSMENT AND PLAN: Likely severe aortic stenosis, symptomatic Dyspnea on exertion I had a lengthy and detailed discussion regarding the pathophysiology of aortic stenosis. We discussed the recommended treatment which would be aortic valve replacement. Patient would like to speak with the cardiothoracic surgeon about this operation. Patient is otherwise healthy. No significant medical comorbidities. Recommend to proceed with right and left heart catheterization. Discussed briefly with Dr. Fletcher Anon.  Advised patient to avoid strenuous activity, avoid dehydration.  HTN BP is well controlled. Continue monitoring BP. Continue current medical therapy and lifestyle changes.  Current medicines are reviewed at length with the patient today.  The patient does not have concerns regarding medicines.  Labs/ tests ordered today include:  Orders Placed This Encounter  Procedures  . DG Chest 2 View  . CBC  with Differential/Platelet  . Basic Metabolic Panel (BMET)  . INR/PT  . Ambulatory referral to Cardiothoracic Surgery  . EKG 12-Lead    I had a lengthy and detailed discussion with the patient regarding diagnoses, prognosis, diagnostic options, treatment options , and side effects of medications.   Disposition:   FU with undersigned in 3 months  I spent at least 45 minutes with the patient today and more than 50% of the time was spent counseling the patient and coordinating care.     Signed, Wende Bushy, MD  07/28/2015 3:44 PM    [place]

## 2015-07-28 NOTE — Patient Instructions (Addendum)
Medication Instructions:  Your physician recommends that you continue on your current medications as directed. Please refer to the Current Medication list given to you today.    Labwork: CBC, BMP, PT/INR, Chest Xray all to be done for work up for catheterization.   Testing/Procedures: [facility] Cardiac Cath Instructions   You are scheduled for a Cardiac Cath on:_Thursday Aug 04, 2014 at 07:30AM_______  Please arrive at _06:30AM__am on the day of your procedure  You will need to pre-register prior to the day of your procedure.  Enter through the [employer] at [facility].  Registration is the first desk on your right.  Please take the procedure order we have given you in order to be registered appropriately  Do not eat/drink anything after midnight  Someone will need to drive you home  It is recommended someone be with you for the first 24 hours after your procedure  Wear clothes that are easy to get on/off and wear slip on shoes if possible   Medications bring a current list of all medications with you  _X__ Do not take your medications before your procedure.   Day of your procedure: Arrive at the [facility] entrance.  Free valet service is available.  After entering the [facility] please check-in at the registration desk (1st desk on your right) to receive your armband. After receiving your armband someone will escort you to the cardiac cath/special procedures waiting area.  The usual length of stay after your procedure is about 2 to 3 hours.  This can vary.  If you have any questions, please call our office at [phone], or you may call the cardiac cath lab at [facility] directly at [phone]   Follow-Up: Your physician recommends that you schedule a follow-up appointment in: 3 months with Dr. Yvone Neu.  Date & Time: ____________________________________________________________________  Dennis Bast have been referred to [facility]. They should call you to set up an  appointment with you soon. Their number is [phone].     Any Other Special Instructions Will Be Listed Below (If Applicable).     If you need a refill on your cardiac medications before your next appointment, please call your pharmacy.  Coronary Angiogram A coronary angiogram, also called coronary angiography, is an X-ray procedure used to look at the arteries in the heart. In this procedure, a dye (contrast dye) is injected through a long, hollow tube (catheter). The catheter is about the size of a piece of cooked spaghetti and is inserted through your groin, wrist, or arm. The dye is injected into each artery, and X-rays are then taken to show if there is a blockage in the arteries of your heart. LET [facility] CARE PROVIDER KNOW ABOUT:  Any allergies you have, including allergies to shellfish or contrast dye.   All medicines you are taking, including vitamins, herbs, eye drops, creams, and over-the-counter medicines.   Previous problems you or members of your family have had with the use of anesthetics.   Any blood disorders you have.   Previous surgeries you have had.  History of kidney problems or failure.   Other medical conditions you have. RISKS AND COMPLICATIONS  Generally, a coronary angiogram is a safe procedure. However, problems can occur and include:  Allergic reaction to the dye.  Bleeding from the access site or other locations.  Kidney injury, especially in people with impaired kidney function.  Stroke (rare).  Heart attack (rare). BEFORE THE PROCEDURE   Do not eat or drink  anything after midnight the night before the procedure or as directed by your health care provider.   Ask your health care provider about changing or stopping your regular medicines. This is especially important if you are taking diabetes medicines or blood thinners. PROCEDURE  You may be given a medicine to help you relax (sedative) before the procedure. This medicine is  given through an intravenous (IV) access tube that is inserted into one of your veins.   The area where the catheter will be inserted will be washed and shaved. This is usually done in the groin but may be done in the fold of your arm (near your elbow) or in the wrist.   A medicine will be given to numb the area where the catheter will be inserted (local anesthetic).   The health care provider will insert the catheter into an artery. The catheter will be guided by using a special type of X-ray (fluoroscopy) of the blood vessel being examined.   A special dye will then be injected into the catheter, and X-rays will be taken. The dye will help to show where any narrowing or blockages are located in the heart arteries.  AFTER THE PROCEDURE   If the procedure is done through the leg, you will be kept in bed lying flat for several hours. You will be instructed to not bend or cross your legs.  The insertion site will be checked frequently.   The pulse in your feet or wrist will be checked frequently.   Additional blood tests, X-rays, and an electrocardiogram may be done.    This information is not intended to replace advice given to you by your health care provider. Make sure you discuss any questions you have with your health care provider.   Document Released: 09/09/2002 Document Revised: 03/26/2014 Document Reviewed: 07/28/2012 Elsevier Interactive Patient Education 2016 Elsevier Inc. Coronary Angiogram With Stent Coronary angiography with stent placement is a procedure to widen or open a narrow blood vessel of the heart (coronary artery). When a coronary artery becomes partially blocked, it decreases blood flow to that area. This may lead to chest pain or a heart attack (myocardial infarction). Arteries may become blocked by cholesterol buildup (plaque) in the lining or wall.  A stent is a small piece of metal that looks like a mesh or a spring. Stent placement may be done right  after a coronary angiography in which a blocked artery is found or as a treatment for a heart attack.  LET [facility] CARE PROVIDER KNOW ABOUT:  Any allergies you have.   All medicines you are taking, including vitamins, herbs, eye drops, creams, and over-the-counter medicines.   Previous problems you or members of your family have had with the use of anesthetics.   Any blood disorders you have.   Previous surgeries you have had.   Medical conditions you have. RISKS AND COMPLICATIONS Generally, coronary angiography with stent is a safe procedure. However, problems can occur and include:  Damage to the heart or its blood vessels.   A return of blockage.   Bleeding, infection, or bruising at the insertion site.   A collection of blood under the skin (hematoma) at the insertion site.  Blood clot in another part of the body.   Kidney injury.   Allergic reaction to the dye or contrast used.   Bleeding into the abdomen (retroperitoneal bleeding). BEFORE THE PROCEDURE  Do not eat or drink anything after midnight on the night before the procedure  or as directed by your health care provider.  Ask your health care provider about changing or stopping your regular medicines. This is especially important if you are taking diabetes medicines or blood thinners.  Your health care provider will make sure you understand the procedure as well as the risks and potential problems associated with the procedure.  PROCEDURE  You may be given a medicine to help you relax before and during the procedure (sedative). This medicine will be given through an IV tube that is put into one of your veins.   The area where the catheter will be inserted will be shaved and cleaned. This is usually done in the groin but may be done in the fold of your arm (near your elbow) or in the wrist.   A medicine will be given to numb the area where the catheter will be inserted (local anesthetic).    The catheter will be inserted into an artery using a guide wire. A type of X-ray (fluoroscopy) will be used to help guide the catheter to the opening of the blocked artery.   A dye will then be injected into the catheter, and X-rays will be taken. The dye will help to show where any narrowing or blockages are located in the heart arteries.   A tiny wire will be guided to the blocked spot, and a balloon will be inflated to make the artery wider. The stent will be expanded and will crush the plaque into the wall of the vessel. The stent will hold the area open like a scaffolding and improve the blood flow.   Sometimes the artery may be made wider using a laser or other tools to remove plaque.   When the blood flow is better, the catheter will be removed. The lining of the artery will grow over the stent, which stays where it was placed.  AFTER THE PROCEDURE  If the procedure is done through the leg, you will be kept in bed lying flat for about 6 hours. You will be instructed to not bend or cross your legs.   The insertion site will be checked frequently.   The pulse in your feet or wrist will be checked frequently.   Additional blood tests, X-rays, and electrocardiography may be done.   This information is not intended to replace advice given to you by your health care provider. Make sure you discuss any questions you have with your health care provider.   Document Released: 09/09/2002 Document Revised: 03/26/2014 Document Reviewed: 07/28/2012 Elsevier Interactive Patient Education [employer].

## 2015-07-29 ENCOUNTER — Ambulatory Visit
Admission: RE | Admit: 2015-07-29 | Discharge: 2015-07-29 | Disposition: A | Discharge status: Home / Self Care | Payer: Medicare Other | Source: Ambulatory Visit | Attending: Cardiology | Admitting: Cardiology

## 2015-07-29 ENCOUNTER — Telehealth: Payer: Self-pay | Admitting: Cardiology

## 2015-07-29 DIAGNOSIS — I350 Nonrheumatic aortic (valve) stenosis: Secondary | ICD-10-CM | POA: Insufficient documentation

## 2015-07-29 DIAGNOSIS — R0602 Shortness of breath: Secondary | ICD-10-CM | POA: Diagnosis not present

## 2015-07-29 DIAGNOSIS — I10 Essential (primary) hypertension: Secondary | ICD-10-CM | POA: Diagnosis not present

## 2015-07-29 DIAGNOSIS — Z01818 Encounter for other preprocedural examination: Secondary | ICD-10-CM

## 2015-07-29 LAB — BASIC METABOLIC PANEL
BUN/Creatinine Ratio: 18 (ref 12–28)
BUN: 19 mg/dL (ref 8–27)
CO2: 24 mmol/L (ref 18–29)
Calcium: 9.5 mg/dL (ref 8.7–10.3)
Chloride: 103 mmol/L (ref 96–106)
Creatinine, Ser: 1.03 mg/dL — ABNORMAL HIGH (ref 0.57–1.00)
GFR calc Af Amer: 64 mL/min/{1.73_m2} (ref >59)
GFR calc non Af Amer: 55 mL/min/{1.73_m2} — ABNORMAL LOW (ref >59)
Glucose: 109 mg/dL — ABNORMAL HIGH (ref 65–99)
Potassium: 4.4 mmol/L (ref 3.5–5.2)
Sodium: 143 mmol/L (ref 134–144)

## 2015-07-29 LAB — CBC WITH DIFFERENTIAL/PLATELET
Basophils Absolute: 0 10*3/uL (ref 0.0–0.2)
Basos: 1 %
EOS (ABSOLUTE): 0.1 10*3/uL (ref 0.0–0.4)
Eos: 1 %
Hematocrit: 36.3 % (ref 34.0–46.6)
Hemoglobin: 12 g/dL (ref 11.1–15.9)
Immature Grans (Abs): 0 10*3/uL (ref 0.0–0.1)
Immature Granulocytes: 0 %
Lymphocytes Absolute: 1.9 10*3/uL (ref 0.7–3.1)
Lymphs: 33 %
MCH: 29.1 pg (ref 26.6–33.0)
MCHC: 33.1 g/dL (ref 31.5–35.7)
MCV: 88 fL (ref 79–97)
Monocytes Absolute: 0.4 10*3/uL (ref 0.1–0.9)
Monocytes: 6 %
Neutrophils Absolute: 3.5 10*3/uL (ref 1.4–7.0)
Neutrophils: 59 %
Platelets: 250 10*3/uL (ref 150–379)
RBC: 4.12 x10E6/uL (ref 3.77–5.28)
RDW: 13.9 % (ref 12.3–15.4)
WBC: 5.9 10*3/uL (ref 3.4–10.8)

## 2015-07-29 LAB — PROTIME-INR
INR: 1 (ref 0.8–1.2)
Prothrombin Time: 10.2 s (ref 9.1–12.0)

## 2015-07-29 NOTE — Telephone Encounter (Signed)
Spoke with patient and instructed her that we had to change the time of her catheterizations. Instructed her that we had to change the time to 1:30PM and arrive at 12:30PM at the [facility] entrance of the [facility]. Patient verbalized understanding of all instructions at this time and had no further questions.

## 2015-08-03 ENCOUNTER — Telehealth: Payer: Self-pay | Admitting: Cardiology

## 2015-08-03 NOTE — Telephone Encounter (Signed)
Reviewed instructions with patient for catheterization that is scheduled for tomorrow. Patient verbalized understanding of instructions, time, and location with no further questions at this time.

## 2015-08-04 ENCOUNTER — Ambulatory Visit
Admission: RE | Admit: 2015-08-04 | Discharge: 2015-08-04 | Disposition: A | Discharge status: Home / Self Care | Payer: Medicare Other | Source: Ambulatory Visit | Attending: Cardiovascular Disease | Admitting: Cardiovascular Disease

## 2015-08-04 ENCOUNTER — Encounter: Payer: Self-pay | Admitting: *Deleted

## 2015-08-04 ENCOUNTER — Encounter
Admission: RE | Disposition: A | Discharge status: Home / Self Care | Payer: Self-pay | Source: Ambulatory Visit | Attending: Cardiovascular Disease

## 2015-08-04 DIAGNOSIS — Z87891 Personal history of nicotine dependence: Secondary | ICD-10-CM | POA: Insufficient documentation

## 2015-08-04 DIAGNOSIS — Z86010 Personal history of colonic polyps: Secondary | ICD-10-CM | POA: Insufficient documentation

## 2015-08-04 DIAGNOSIS — I350 Nonrheumatic aortic (valve) stenosis: Secondary | ICD-10-CM | POA: Insufficient documentation

## 2015-08-04 DIAGNOSIS — Z801 Family history of malignant neoplasm of trachea, bronchus and lung: Secondary | ICD-10-CM | POA: Insufficient documentation

## 2015-08-04 DIAGNOSIS — M810 Age-related osteoporosis without current pathological fracture: Secondary | ICD-10-CM | POA: Diagnosis not present

## 2015-08-04 DIAGNOSIS — I10 Essential (primary) hypertension: Secondary | ICD-10-CM | POA: Insufficient documentation

## 2015-08-04 DIAGNOSIS — Z79899 Other long term (current) drug therapy: Secondary | ICD-10-CM | POA: Diagnosis not present

## 2015-08-04 DIAGNOSIS — I2510 Atherosclerotic heart disease of native coronary artery without angina pectoris: Secondary | ICD-10-CM | POA: Diagnosis not present

## 2015-08-04 DIAGNOSIS — R0602 Shortness of breath: Secondary | ICD-10-CM | POA: Insufficient documentation

## 2015-08-04 DIAGNOSIS — Z8249 Family history of ischemic heart disease and other diseases of the circulatory system: Secondary | ICD-10-CM | POA: Diagnosis not present

## 2015-08-04 DIAGNOSIS — Z823 Family history of stroke: Secondary | ICD-10-CM | POA: Diagnosis not present

## 2015-08-04 DIAGNOSIS — Z833 Family history of diabetes mellitus: Secondary | ICD-10-CM | POA: Diagnosis not present

## 2015-08-04 DIAGNOSIS — Z90710 Acquired absence of both cervix and uterus: Secondary | ICD-10-CM | POA: Insufficient documentation

## 2015-08-04 HISTORY — PX: CARDIAC CATHETERIZATION: SHX172

## 2015-08-04 SURGERY — RIGHT/LEFT HEART CATH AND CORONARY ANGIOGRAPHY
Anesthesia: Moderate Sedation | Laterality: Bilateral

## 2015-08-04 MED ORDER — HEPARIN SODIUM (PORCINE) 1000 UNIT/ML IJ SOLN
INTRAMUSCULAR | Status: AC
  Filled 2015-08-04: qty 1

## 2015-08-04 MED ORDER — FENTANYL CITRATE (PF) 100 MCG/2ML IJ SOLN
INTRAMUSCULAR | Status: AC
  Filled 2015-08-04: qty 2

## 2015-08-04 MED ORDER — SODIUM CHLORIDE 0.9% FLUSH: 3.0000 mL | INTRAVENOUS | Status: DC | PRN

## 2015-08-04 MED ORDER — VERAPAMIL HCL 2.5 MG/ML IV SOLN
INTRAVENOUS | Status: AC
  Filled 2015-08-04: qty 2

## 2015-08-04 MED ORDER — VERAPAMIL HCL 2.5 MG/ML IV SOLN
INTRAVENOUS | Status: DC | PRN
  Administered 2015-08-04: 2.5 mg via INTRA_ARTERIAL

## 2015-08-04 MED ORDER — LABETALOL HCL 5 MG/ML IV SOLN
INTRAVENOUS | Status: AC
  Filled 2015-08-04: qty 4

## 2015-08-04 MED ORDER — FENTANYL CITRATE (PF) 100 MCG/2ML IJ SOLN
INTRAMUSCULAR | Status: DC | PRN
  Administered 2015-08-04: 12.5 ug via INTRAVENOUS
  Administered 2015-08-04: 25 ug via INTRAVENOUS

## 2015-08-04 MED ORDER — MIDAZOLAM HCL 2 MG/2ML IJ SOLN
INTRAMUSCULAR | Status: DC | PRN
  Administered 2015-08-04: 0.5 mg via INTRAVENOUS

## 2015-08-04 MED ORDER — LABETALOL HCL 5 MG/ML IV SOLN
INTRAVENOUS | Status: DC | PRN
  Administered 2015-08-04: 10 mg via INTRAVENOUS

## 2015-08-04 MED ORDER — HEPARIN SODIUM (PORCINE) 1000 UNIT/ML IJ SOLN
INTRAMUSCULAR | Status: DC | PRN
  Administered 2015-08-04: 4000 [IU] via INTRAVENOUS

## 2015-08-04 MED ORDER — IOPAMIDOL (ISOVUE-300) INJECTION 61%
INTRAVENOUS | Status: DC | PRN
  Administered 2015-08-04: 55 mL via INTRA_ARTERIAL

## 2015-08-04 MED ORDER — SODIUM CHLORIDE 0.9 % IV SOLN: 250.0000 mL | INTRAVENOUS | Status: DC | PRN

## 2015-08-04 MED ORDER — MIDAZOLAM HCL 2 MG/2ML IJ SOLN
INTRAMUSCULAR | Status: AC
  Filled 2015-08-04: qty 2

## 2015-08-04 MED ORDER — SODIUM CHLORIDE 0.9 % IV SOLN
INTRAVENOUS | Status: DC
  Administered 2015-08-04: 13:00:00 via INTRAVENOUS

## 2015-08-04 MED ORDER — SODIUM CHLORIDE 0.9 % IV SOLN
INTRAVENOUS | Status: DC
  Administered 2015-08-04: 18:00:00 via INTRAVENOUS

## 2015-08-04 MED ORDER — SODIUM CHLORIDE 0.9% FLUSH: 3.0000 mL | Freq: Two times a day (BID) | INTRAVENOUS | Status: DC

## 2015-08-04 MED ORDER — HEPARIN (PORCINE) IN NACL 2-0.9 UNIT/ML-% IJ SOLN
INTRAMUSCULAR | Status: AC
  Filled 2015-08-04: qty 1000

## 2015-08-04 SURGICAL SUPPLY — 13 items
CATH BALLN WEDGE 5F 110CM (CATHETERS) ×2 IMPLANT
CATH INFINITI 5 FR AL2 (CATHETERS) ×2 IMPLANT
CATH INFINITI 5FR AL1 (CATHETERS) ×2 IMPLANT
CATH INFINITI 5FR ANG PIGTAIL (CATHETERS) ×2 IMPLANT
CATH OPTITORQUE JACKY 4.0 5F (CATHETERS) ×2 IMPLANT
DEVICE RAD TR BAND REGULAR (VASCULAR PRODUCTS) ×2 IMPLANT
GLIDESHEATH SLEND SS 6F .021 (SHEATH) ×4 IMPLANT
KIT MANI 3VAL PERCEP (MISCELLANEOUS) ×3 IMPLANT
KIT RIGHT HEART (MISCELLANEOUS) ×3 IMPLANT
PACK CARDIAC CATH (CUSTOM PROCEDURE TRAY) ×3 IMPLANT
WIRE EMERALD ST .035X150CM (WIRE) ×2 IMPLANT
WIRE HITORQ VERSACORE ST 145CM (WIRE) ×2 IMPLANT
WIRE SAFE-T 1.5MM-J .035X260CM (WIRE) ×2 IMPLANT

## 2015-08-04 NOTE — Progress Notes (Signed)
Pt remains clinically stable post procedure, ate supper, cont. To release air from tr band, no active bleeding at this time. Discharge teaching done along with return for [facility] with [facility] Lawson Fiscal for valve surgery, questions answered, will plan for discharge today with surgery in future.

## 2015-08-04 NOTE — Discharge Instructions (Signed)
Groin Insertion Instructions-If you lose feeling or develop tingling or pain after the procedure, please walk around first.  If the discomfort does not improve contact your physician and proceed to the nearest emergency room.    You may shower after 24 hours but avoid excessive warm water and do not scrub the site.  Remove clear dressing in 48 hours.  If you have had a closure device inserted, do not soak in a tub bath or a hot tub for at least one week.  No driving for 48 hours after discharge.  After the procedure, check the insertion site occasionally.  If any oozing occurs or there is apparent swelling, firm pressure over the site will prevent a bruise from forming.  You can not hurt anything by pressing directly on the site.  The pressure stops the bleeding by allowing a small clot to form.  If the bleeding continues after the pressure has been applied for more than 15 minutes, call 911 or go to the nearest emergency room.    The x-ray dye causes you to pass a considerate amount of urine.  For this reason, you will be asked to drink plenty of liquids after the procedure to prevent dehydration.  You may resume you regular diet.  Avoid caffeine products.    For pain at the site of your procedure, take non-aspirin medicines such as Tylenol.  The drugs you were given will stay in your system until tomorrow, so for the next 24 hours you should not drive an automobile, make any legal decisions, drink any alcoholic beverages, or operate machinery.  Resume your regular medications as prescribed by your doctor.  Change the Band-Aid or dressing as needed.  After a 2 days no dressing as needed.  Avoid strenuous activity until you follow up and talk with Dr [name].  Please notify your primary physician immediately if you have any unusual bleeding, trouble breathing, fever >100 degrees or pain not relieved by the medication your doctor prescribed for your doctor prescribed for you physician

## 2015-08-04 NOTE — H&P (View-Only) (Signed)
Cardiology Office Note   Date:  07/28/2015   ID:  Heidi Middleton, DOB 18-May-1945, MRN [id number]  Referring Doctor:  Loura Pardon, MD   Cardiologist:   Wende Bushy, MD   Reason for consultation:  Chief Complaint  Patient presents with  . other    Follow up from Echo for eval. of aortic stenosis. Meds reviewed by the patient verbally. Pt. c/o shortness of breath.       History of Present Illness: Heidi Middleton is a 70 y.o. female who presents for Evaluation for aortic stenosis.  Patient presented to PCP for routine visit. Murmur was heard. Echocardiogram was done. Echo revealed moderate to likely severe aortic stenosis. Patient presents here in cardiology for further evaluation.  Regarding physical activity, patient regularly exercises, doing yoga, and aerobic exercises. Since one year ago, she has noticed shortness of breath doing aerobic exercises. She can only do it for 5 minutes now when she was able to do it for 20-40 minutes before. Shortness of breath is extreme, to the point that she feels she could pass out. No loss of consciousness so far. No chest pains. Currently, she just tries to do yoga exercises to avoid shortness breath. Routine physical activity at home without any problems.  No fever, cough, colds, abdominal pain. No orthopnea, edema, PND.  ROS:  Please see the history of present illness. Aside from mentioned under HPI, all other systems are reviewed and negative.    Past Medical History  Diagnosis Date  . Colonic polyp   . Hypertension   . Osteoporosis     Past Surgical History  Procedure Laterality Date  . Abdominal hysterectomy    . Knee surgery  10/2005    calcinosis  . Eye surgery N/A     detatched retina  . Breast biopsy Right 1990    EXCISIONAL - NEG     reports that she has quit smoking. She does not have any smokeless tobacco history on file. She reports that she does not drink alcohol or use illicit drugs.   family history includes  Cancer in her brother, father, and mother; Diabetes in her maternal grandmother; Heart disease in her sister; Hypertension in her brother and brother; Lung cancer in her brother; Obesity in her sister; Stroke in her mother. There is no history of Breast cancer.   Current Outpatient Prescriptions  Medication Sig Dispense Refill  . atorvastatin (LIPITOR) 10 MG tablet TAKE 0.5 TABLETS (5 MG TOTAL) BY MOUTH DAILY. 45 tablet 3  . Biotin 5000 MCG CAPS Take 5,000 mcg by mouth 2 (two) times daily.    . Calcium Carbonate-Vitamin D (CALCIUM-VITAMIN D) 500-200 MG-UNIT per tablet 1200mg  by mouth daily    . cholecalciferol (VITAMIN D) 1000 UNITS tablet Take 1,000 Units by mouth 2 (two) times daily.     . Coenzyme Q10 (COQ-10) 100 MG CAPS Take 100 mg by mouth daily.    Marland Kitchen esomeprazole (NEXIUM) 40 MG capsule TAKE 1 CAPSULE (40 MG TOTAL) BY MOUTH DAILY BEFORE BREAKFAST. 90 capsule 3  . Glucosamine-Chondroit-Vit C-Mn (GLUCOSAMINE CHONDR 1500 COMPLX PO) Take 1 tablet by mouth 2 (two) times daily.    . hydrochlorothiazide (HYDRODIURIL) 25 MG tablet TAKE 1 TABLET (25 MG TOTAL) BY MOUTH DAILY. 90 tablet 3  . Multiple Vitamin (MULTIVITAMIN) tablet Take 1 tablet by mouth daily.    . Omega-3 Fatty Acids (FISH OIL) 1000 MG CAPS Take 1 capsule by mouth 2 (two) times daily.     No  current facility-administered medications for this visit.    Allergies: Codeine; Omeprazole; and Ranitidine hcl    PHYSICAL EXAM: VS:  BP 128/78 mmHg  Pulse 82  Ht 5\' 8"  (1.727 m)  Wt 168 lb (76.204 kg)  BMI 25.55 kg/m2 , Body mass index is 25.55 kg/(m^2). Wt Readings from Last 3 Encounters:  07/28/15 168 lb (76.204 kg)  06/24/15 166 lb 12 oz (75.637 kg)  06/20/15 167 lb (75.751 kg)    GENERAL:  well developed, well nourished, not in acute distress HEENT: normocephalic, pink conjunctivae, anicteric sclerae, no xanthelasma, normal dentition, oropharynx clear NECK:  no neck vein engorgement, JVP normal, no hepatojugular reflux, carotid  upstroke brisk and symmetric, no bruit, no thyromegaly, no lymphadenopathy LUNGS:  good respiratory effort, clear to auscultation bilaterally CV:  PMI not displaced, no thrills, no lifts, S1 and S2 within normal limits, no palpable S3 or S4, , no rubs, no gallops, 4/6 systolic ejection murmur consistent with AS, radiating up to bilateral carotids, no delay in carotid or radial impulse. ABD:  Soft, nontender, nondistended, normoactive bowel sounds, no abdominal aortic bruit, no hepatomegaly, no splenomegaly MS: nontender back, no kyphosis, no scoliosis, no joint deformities EXT:  2+ DP/PT pulses, no edema, no varicosities, no cyanosis, no clubbing SKIN: warm, nondiaphoretic, normal turgor, no ulcers NEUROPSYCH: alert, oriented to person, place, and time, sensory/motor grossly intact, normal mood, appropriate affect  Recent Labs: 06/20/2015: ALT 17; BUN 22; Creatinine, Ser 1.05; Hemoglobin 12.6; Platelets 244.0; Potassium 4.8; Sodium 133*; TSH 1.62   Lipid Panel    Component Value Date/Time   CHOL 177 06/20/2015 0826   TRIG 73.0 06/20/2015 0826   HDL 78.30 06/20/2015 0826   CHOLHDL 2 06/20/2015 0826   VLDL 14.6 06/20/2015 0826   LDLCALC 84 06/20/2015 0826     Other studies Reviewed:  EKG:  The ekg from 07/28/2015 was personally reviewed by me and it revealed sinus rhythm, 82 BPM, nonspecific ST abnormality.  Additional studies/ records that were reviewed personally reviewed by me today include:  Echo 07/06/2015: Left ventricle: The cavity size was normal. There was focal basal  hypertrophy. Systolic function was vigorous. The estimated  ejection fraction was in the range of 65% to 70%. Wall motion was  normal; there were no regional wall motion abnormalities. Doppler  parameters are consistent with abnormal left ventricular  relaxation (grade 1 diastolic dysfunction). - Aortic valve: There was moderate to likely severe stenosis.  Velocity ratio is 0.34. AV Vmax is 3.23m/s. AVA  by Vmax is  0.9cm2. AVA by VTI is 1.0cm2 Valve area (VTI): 0.95 cm^2. Valve  area (Vmax): 0.98 cm^2. Valve area (Vmean): 1.07 cm^2. - Mitral valve: Mildly calcified annulus. No AI  ASSESSMENT AND PLAN: Likely severe aortic stenosis, symptomatic Dyspnea on exertion I had a lengthy and detailed discussion regarding the pathophysiology of aortic stenosis. We discussed the recommended treatment which would be aortic valve replacement. Patient would like to speak with the cardiothoracic surgeon about this operation. Patient is otherwise healthy. No significant medical comorbidities. Recommend to proceed with right and left heart catheterization. Discussed briefly with Dr. Fletcher Anon.  Advised patient to avoid strenuous activity, avoid dehydration.  HTN BP is well controlled. Continue monitoring BP. Continue current medical therapy and lifestyle changes.  Current medicines are reviewed at length with the patient today.  The patient does not have concerns regarding medicines.  Labs/ tests ordered today include:  Orders Placed This Encounter  Procedures  . DG Chest 2 View  . CBC  with Differential/Platelet  . Basic Metabolic Panel (BMET)  . INR/PT  . Ambulatory referral to Cardiothoracic Surgery  . EKG 12-Lead    I had a lengthy and detailed discussion with the patient regarding diagnoses, prognosis, diagnostic options, treatment options , and side effects of medications.   Disposition:   FU with undersigned in 3 months  I spent at least 45 minutes with the patient today and more than 50% of the time was spent counseling the patient and coordinating care.     Signed, Wende Bushy, MD  07/28/2015 3:44 PM    [place]

## 2015-08-04 NOTE — Interval H&P Note (Signed)
History and Physical Interval Note:  08/04/2015 2:04 PM  Heidi Middleton  has presented today for surgery, with the diagnosis of Severe aortic stenosis  The various methods of treatment have been discussed with the patient and family. After consideration of risks, benefits and other options for treatment, the patient has consented to  Procedure(s): Right/Left Heart Cath and Coronary Angiography (Bilateral) as a surgical intervention .  The patient's history has been reviewed, patient examined, no change in status, stable for surgery.  I have reviewed the patient's chart and labs.  Questions were answered to the patient's satisfaction.     Kathlyn [place]

## 2015-08-04 NOTE — Progress Notes (Signed)
No bleeding nor hematoma, dressing applied, will plan for discharge in 21min's as no bleeding,

## 2015-08-05 ENCOUNTER — Encounter: Payer: Self-pay | Admitting: Cardiovascular Disease

## 2015-08-09 ENCOUNTER — Encounter: Payer: Self-pay | Admitting: Cardiothoracic Surgery

## 2015-08-09 ENCOUNTER — Institutional Professional Consult (permissible substitution) (INDEPENDENT_AMBULATORY_CARE_PROVIDER_SITE_OTHER): Payer: Medicare Other | Admitting: Cardiothoracic Surgery

## 2015-08-09 VITALS — BP 145/80 | HR 90 | Resp 20 | Ht 68.0 in | Wt 168.0 lb

## 2015-08-09 DIAGNOSIS — I350 Nonrheumatic aortic (valve) stenosis: Secondary | ICD-10-CM | POA: Diagnosis not present

## 2015-08-09 DIAGNOSIS — I10 Essential (primary) hypertension: Secondary | ICD-10-CM | POA: Diagnosis not present
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
## 2015-08-10 ENCOUNTER — Other Ambulatory Visit: Payer: Self-pay | Admitting: *Deleted

## 2015-08-10 DIAGNOSIS — I350 Nonrheumatic aortic (valve) stenosis: Secondary | ICD-10-CM

## 2015-08-11 ENCOUNTER — Encounter (HOSPITAL_COMMUNITY)
Admission: RE | Admit: 2015-08-11 | Discharge: 2015-08-11 | Disposition: A | Discharge status: Home / Self Care | Payer: Medicare Other | Source: Ambulatory Visit | Attending: Cardiothoracic Surgery | Admitting: Cardiothoracic Surgery

## 2015-08-11 ENCOUNTER — Encounter (HOSPITAL_COMMUNITY): Payer: Self-pay

## 2015-08-11 ENCOUNTER — Ambulatory Visit (HOSPITAL_COMMUNITY)
Admission: RE | Admit: 2015-08-11 | Discharge: 2015-08-11 | Disposition: A | Discharge status: Home / Self Care | Payer: Medicare Other | Source: Ambulatory Visit | Attending: Cardiothoracic Surgery | Admitting: Cardiothoracic Surgery

## 2015-08-11 ENCOUNTER — Encounter (HOSPITAL_COMMUNITY): Payer: Medicare Other

## 2015-08-11 ENCOUNTER — Other Ambulatory Visit (HOSPITAL_COMMUNITY): Payer: Self-pay | Admitting: *Deleted

## 2015-08-11 VITALS — BP 148/78 | HR 86 | Temp 97.0°F | Resp 20 | Ht 68.0 in | Wt 165.0 lb

## 2015-08-11 DIAGNOSIS — I350 Nonrheumatic aortic (valve) stenosis: Secondary | ICD-10-CM | POA: Insufficient documentation

## 2015-08-11 DIAGNOSIS — Z0183 Encounter for blood typing: Secondary | ICD-10-CM | POA: Diagnosis not present

## 2015-08-11 DIAGNOSIS — Z01818 Encounter for other preprocedural examination: Secondary | ICD-10-CM | POA: Diagnosis not present

## 2015-08-11 DIAGNOSIS — Z01812 Encounter for preprocedural laboratory examination: Secondary | ICD-10-CM | POA: Insufficient documentation

## 2015-08-11 DIAGNOSIS — J449 Chronic obstructive pulmonary disease, unspecified: Secondary | ICD-10-CM | POA: Insufficient documentation

## 2015-08-11 DIAGNOSIS — I6522 Occlusion and stenosis of left carotid artery: Secondary | ICD-10-CM | POA: Diagnosis not present

## 2015-08-11 HISTORY — DX: Other specified postprocedural states: Z98.890

## 2015-08-11 HISTORY — DX: Zoster without complications: B02.9

## 2015-08-11 HISTORY — DX: Headache: R51

## 2015-08-11 HISTORY — DX: Nonrheumatic aortic (valve) stenosis: I35.0

## 2015-08-11 HISTORY — DX: Gastro-esophageal reflux disease without esophagitis: K21.9

## 2015-08-11 HISTORY — DX: Other specified postprocedural states: R11.2

## 2015-08-11 HISTORY — DX: Headache, unspecified: R51.9

## 2015-08-11 LAB — PULMONARY FUNCTION TEST
DL/VA % pred: 65 %
DL/VA: 3.44 ml/min/mmHg/L
DLCO unc % pred: 55 %
DLCO unc: 16.35 ml/min/mmHg
FEF 25-75 Post: 1.15 L/sec
FEF 25-75 Pre: 0.79 L/sec
FEF2575-%Change-Post: 45 %
FEF2575-%Pred-Post: 55 %
FEF2575-%Pred-Pre: 37 %
FEV1-%Change-Post: 12 %
FEV1-%Pred-Post: 70 %
FEV1-%Pred-Pre: 62 %
FEV1-Post: 1.85 L
FEV1-Pre: 1.65 L
FEV1FVC-%Change-Post: -4 %
FEV1FVC-%Pred-Pre: 78 %
FEV6-%Change-Post: 14 %
FEV6-%Pred-Post: 91 %
FEV6-%Pred-Pre: 79 %
FEV6-Post: 3.03 L
FEV6-Pre: 2.64 L
FEV6FVC-%Change-Post: -2 %
FEV6FVC-%Pred-Post: 97 %
FEV6FVC-%Pred-Pre: 99 %
FVC-%Change-Post: 17 %
FVC-%Pred-Post: 92 %
FVC-%Pred-Pre: 79 %
FVC-Post: 3.23 L
FVC-Pre: 2.75 L
Post FEV1/FVC ratio: 57 %
Post FEV6/FVC ratio: 94 %
Pre FEV1/FVC ratio: 60 %
Pre FEV6/FVC Ratio: 96 %
RV % pred: 109 %
RV: 2.63 L
TLC % pred: 102 %
TLC: 5.77 L

## 2015-08-11 LAB — URINALYSIS, ROUTINE W REFLEX MICROSCOPIC
Bilirubin Urine: NEGATIVE
Glucose, UA: NEGATIVE mg/dL
Hgb urine dipstick: NEGATIVE
Ketones, ur: NEGATIVE mg/dL
Leukocytes, UA: NEGATIVE
Nitrite: NEGATIVE
Protein, ur: NEGATIVE mg/dL
Specific Gravity, Urine: 1.008 (ref 1.005–1.030)
pH: 6 (ref 5.0–8.0)

## 2015-08-11 LAB — BLOOD GAS, ARTERIAL
Acid-Base Excess: 0.8 mmol/L (ref 0.0–2.0)
Bicarbonate: 24.5 mEq/L — ABNORMAL HIGH (ref 20.0–24.0)
Drawn by: 421801
FIO2: 0.21
O2 Saturation: 96.7 %
Patient temperature: 98.6
TCO2: 25.6 mmol/L (ref 0–100)
pCO2 arterial: 36.1 mmHg (ref 35.0–45.0)
pH, Arterial: 7.446 (ref 7.350–7.450)
pO2, Arterial: 88.8 mmHg (ref 80.0–100.0)

## 2015-08-11 LAB — COMPREHENSIVE METABOLIC PANEL
ALT: 22 U/L (ref 14–54)
AST: 29 U/L (ref 15–41)
Albumin: 3.8 g/dL (ref 3.5–5.0)
Alkaline Phosphatase: 80 U/L (ref 38–126)
Anion gap: 11 (ref 5–15)
BUN: 14 mg/dL (ref 6–20)
CO2: 23 mmol/L (ref 22–32)
Calcium: 10.6 mg/dL — ABNORMAL HIGH (ref 8.9–10.3)
Chloride: 103 mmol/L (ref 101–111)
Creatinine, Ser: 1.17 mg/dL — ABNORMAL HIGH (ref 0.44–1.00)
GFR calc Af Amer: 53 mL/min — ABNORMAL LOW (ref >60)
GFR calc non Af Amer: 46 mL/min — ABNORMAL LOW (ref >60)
Glucose, Bld: 113 mg/dL — ABNORMAL HIGH (ref 65–99)
Potassium: 3.9 mmol/L (ref 3.5–5.1)
Sodium: 137 mmol/L (ref 135–145)
Total Bilirubin: 0.6 mg/dL (ref 0.3–1.2)
Total Protein: 7.3 g/dL (ref 6.5–8.1)

## 2015-08-11 LAB — SURGICAL PCR SCREEN
MRSA, PCR: NEGATIVE
Staphylococcus aureus: NEGATIVE

## 2015-08-11 LAB — CBC
HCT: 39.2 % (ref 36.0–46.0)
Hemoglobin: 13 g/dL (ref 12.0–15.0)
MCH: 28.9 pg (ref 26.0–34.0)
MCHC: 33.2 g/dL (ref 30.0–36.0)
MCV: 87.1 fL (ref 78.0–100.0)
Platelets: 272 10*3/uL (ref 150–400)
RBC: 4.5 MIL/uL (ref 3.87–5.11)
RDW: 12.9 % (ref 11.5–15.5)
WBC: 6.8 10*3/uL (ref 4.0–10.5)

## 2015-08-11 LAB — PROTIME-INR
INR: 1.02 (ref 0.00–1.49)
Prothrombin Time: 13.6 seconds (ref 11.6–15.2)

## 2015-08-11 LAB — APTT: aPTT: 31 seconds (ref 24–37)

## 2015-08-11 LAB — ABO/RH: ABO/RH(D): O POS

## 2015-08-11 MED ORDER — ALBUTEROL SULFATE (2.5 MG/3ML) 0.083% IN NEBU
2.5000 mg | INHALATION_SOLUTION | Freq: Once | RESPIRATORY_TRACT | Status: AC
  Administered 2015-08-11: 2.5 mg via RESPIRATORY_TRACT

## 2015-08-11 NOTE — Pre-Procedure Instructions (Signed)
Heidi Middleton  08/11/2015    Your procedure is scheduled on Tuesday, Aug 16, 2015 at 7:30 M.   Report to [facility] Entrance "A" Admitting Office at 5:30 AM.   Call this number if you have problems the morning of surgery: [phone]   Any questions prior to day of surgery, please call [phone] between 8 & 4 PM.   Remember:  Do not eat food or drink liquids after midnight Monday, 08/15/15.  Take these medicines the morning of surgery with A SIP OF WATER: Esomeprazole (Nexium)  Stop Multivitamins, Herbal medications and Fish Oil as of today.   Do not wear jewelry, make-up or nail polish.  Do not wear lotions, powders, or perfumes.  You may NOT wear deodorant.  Do not shave 48 hours prior to surgery.  .  Do not bring valuables to the hospital.  [facility] is not responsible for any belongings or valuables.  Contacts, dentures or bridgework may not be worn into surgery.  Leave your suitcase in the car.  After surgery it may be brought to your room.  For patients admitted to the hospital, discharge time will be determined by your treatment team.  Special instructions:  [place] - Preparing for Surgery  Before surgery, you can play an important role.  Because skin is not sterile, your skin needs to be as free of germs as possible.  You can reduce the number of germs on you skin by washing with CHG (chlorahexidine gluconate) soap before surgery.  CHG is an antiseptic cleaner which kills germs and bonds with the skin to continue killing germs even after washing.  Please DO NOT use if you have an allergy to CHG or antibacterial soaps.  If your skin becomes reddened/irritated stop using the CHG and inform your nurse when you arrive at Short Stay.  Do not shave (including legs and underarms) for at least 48 hours prior to the first CHG shower.  You may shave your face.  Please follow these instructions carefully:   1.  Shower with CHG Soap the night before surgery and  the                                morning of Surgery.  2.  If you choose to wash your hair, wash your hair first as usual with your       normal shampoo.  3.  After you shampoo, rinse your hair and body thoroughly to remove the                      Shampoo.  4.  Use CHG as you would any other liquid soap.  You can apply chg directly       to the skin and wash gently with scrungie or a clean washcloth.  5.  Apply the CHG Soap to your body ONLY FROM THE NECK DOWN.        Do not use on open wounds or open sores.  Avoid contact with your eyes, ears, mouth and genitals (private parts).  Wash genitals (private parts) with your normal soap.  6.  Wash thoroughly, paying special attention to the area where your surgery        will be performed.  7.  Thoroughly rinse your body with warm water from the neck down.  8.  DO NOT shower/wash with your normal soap after using and  rinsing off       the CHG Soap.  9.  Pat yourself dry with a clean towel.            10.  Wear clean pajamas.            11.  Place clean sheets on your bed the night of your first shower and do not        sleep with pets.  Day of Surgery  Do not apply any lotions/deodorants the morning of surgery. Please wear clean clothes to the hospital.   Please read over the following fact sheets that you were given. Pain Booklet, Coughing and Deep Breathing, MRSA Information and Surgical Site Infection Prevention

## 2015-08-11 NOTE — Progress Notes (Signed)
Pre-op Cardiac Surgery  Carotid Findings:  Evidence of left ICA stenosis 1-39%. No evidence of stenosis on the right.  Upper Extremity Right Left  Brachial Pressures 153 Triphasic 150 Triphasic  Radial Waveforms Triphasic Triphasic  Ulnar Waveforms Triphasic Triphasic  Palmar Arch (Allen's Test) Normal abnormal   Findings:  Palmar Arch evaluation-Doppler waveforms remain within normal limits with both radial and ulnar compression on the right. Left Doppler waveforms remain normal with radial compression and diminished >50% with ulnar compression.

## 2015-08-11 NOTE — Progress Notes (Signed)
Pt just recently diagnosed with Aortic stenosis, denies previous cardiac history. Denies chest pain or sob.   PCP - Dr. Glori Bickers Cardiologist - Dr. Yvone Neu  EKG - 08/04/15 - in EPIC Echo - 07/06/15 - in EPIC Cath - 08/04/15 - in EPIC

## 2015-08-12 LAB — HEMOGLOBIN A1C
Hgb A1c MFr Bld: 5.7 % — ABNORMAL HIGH (ref 4.8–5.6)
Mean Plasma Glucose: 117 mg/dL

## 2015-08-14 MED ORDER — CHLORHEXIDINE GLUCONATE 0.12 % MT SOLN
15.0000 mL | Freq: Once | OROMUCOSAL | Status: AC
  Administered 2015-08-16: 15 mL via OROMUCOSAL
  Filled 2015-08-14: qty 15

## 2015-08-14 MED ORDER — METOPROLOL TARTRATE 12.5 MG HALF TABLET
12.5000 mg | ORAL_TABLET | Freq: Once | ORAL | Status: AC
  Administered 2015-08-16: 12.5 mg via ORAL
  Filled 2015-08-14: qty 1

## 2015-08-16 ENCOUNTER — Encounter (HOSPITAL_COMMUNITY)
Admission: RE | Disposition: A | Discharge status: Home / Self Care | Payer: Self-pay | Source: Ambulatory Visit | Attending: Cardiothoracic Surgery

## 2015-08-16 ENCOUNTER — Inpatient Hospital Stay (HOSPITAL_COMMUNITY): Payer: Medicare Other

## 2015-08-16 ENCOUNTER — Inpatient Hospital Stay (HOSPITAL_COMMUNITY)
Admission: RE | Admit: 2015-08-16 | Discharge: 2015-08-21 | DRG: 220 | Disposition: A | Discharge status: Home / Self Care | Payer: Medicare Other | Source: Ambulatory Visit | Attending: Cardiothoracic Surgery | Admitting: Cardiothoracic Surgery

## 2015-08-16 ENCOUNTER — Inpatient Hospital Stay (HOSPITAL_COMMUNITY): Payer: Medicare Other | Admitting: Anesthesiology

## 2015-08-16 ENCOUNTER — Encounter (HOSPITAL_COMMUNITY): Payer: Self-pay | Admitting: General Practice

## 2015-08-16 DIAGNOSIS — M1990 Unspecified osteoarthritis, unspecified site: Secondary | ICD-10-CM | POA: Diagnosis present

## 2015-08-16 DIAGNOSIS — Z79899 Other long term (current) drug therapy: Secondary | ICD-10-CM

## 2015-08-16 DIAGNOSIS — K5900 Constipation, unspecified: Secondary | ICD-10-CM | POA: Diagnosis not present

## 2015-08-16 DIAGNOSIS — I10 Essential (primary) hypertension: Secondary | ICD-10-CM | POA: Diagnosis present

## 2015-08-16 DIAGNOSIS — R110 Nausea: Secondary | ICD-10-CM | POA: Diagnosis not present

## 2015-08-16 DIAGNOSIS — R7303 Prediabetes: Secondary | ICD-10-CM | POA: Diagnosis present

## 2015-08-16 DIAGNOSIS — Z87891 Personal history of nicotine dependence: Secondary | ICD-10-CM | POA: Diagnosis not present

## 2015-08-16 DIAGNOSIS — Z952 Presence of prosthetic heart valve: Secondary | ICD-10-CM

## 2015-08-16 DIAGNOSIS — D62 Acute posthemorrhagic anemia: Secondary | ICD-10-CM | POA: Diagnosis not present

## 2015-08-16 DIAGNOSIS — E8770 Fluid overload, unspecified: Secondary | ICD-10-CM | POA: Diagnosis present

## 2015-08-16 DIAGNOSIS — K219 Gastro-esophageal reflux disease without esophagitis: Secondary | ICD-10-CM | POA: Diagnosis present

## 2015-08-16 DIAGNOSIS — R0602 Shortness of breath: Secondary | ICD-10-CM | POA: Diagnosis present

## 2015-08-16 DIAGNOSIS — J45909 Unspecified asthma, uncomplicated: Secondary | ICD-10-CM | POA: Diagnosis present

## 2015-08-16 DIAGNOSIS — D696 Thrombocytopenia, unspecified: Secondary | ICD-10-CM | POA: Diagnosis present

## 2015-08-16 DIAGNOSIS — I350 Nonrheumatic aortic (valve) stenosis: Secondary | ICD-10-CM | POA: Diagnosis not present

## 2015-08-16 HISTORY — PX: AORTIC VALVE REPLACEMENT: SHX41

## 2015-08-16 HISTORY — PX: TEE WITHOUT CARDIOVERSION: SHX5443

## 2015-08-16 LAB — POCT I-STAT, CHEM 8
BUN: 17 mg/dL (ref 6–20)
BUN: 13 mg/dL (ref 6–20)
BUN: 16 mg/dL (ref 6–20)
BUN: 15 mg/dL (ref 6–20)
BUN: 15 mg/dL (ref 6–20)
BUN: 13 mg/dL (ref 6–20)
Calcium, Ion: 1.25 mmol/L (ref 1.13–1.30)
Calcium, Ion: 0.98 mmol/L — ABNORMAL LOW (ref 1.13–1.30)
Calcium, Ion: 1.25 mmol/L (ref 1.13–1.30)
Calcium, Ion: 1.04 mmol/L — ABNORMAL LOW (ref 1.13–1.30)
Calcium, Ion: 1.07 mmol/L — ABNORMAL LOW (ref 1.13–1.30)
Calcium, Ion: 1.31 mmol/L — ABNORMAL HIGH (ref 1.13–1.30)
Chloride: 103 mmol/L (ref 101–111)
Chloride: 103 mmol/L (ref 101–111)
Chloride: 97 mmol/L — ABNORMAL LOW (ref 101–111)
Chloride: 101 mmol/L (ref 101–111)
Chloride: 102 mmol/L (ref 101–111)
Chloride: 106 mmol/L (ref 101–111)
Creatinine, Ser: 0.8 mg/dL (ref 0.44–1.00)
Creatinine, Ser: 0.6 mg/dL (ref 0.44–1.00)
Creatinine, Ser: 0.7 mg/dL (ref 0.44–1.00)
Creatinine, Ser: 0.7 mg/dL (ref 0.44–1.00)
Creatinine, Ser: 0.6 mg/dL (ref 0.44–1.00)
Creatinine, Ser: 0.8 mg/dL (ref 0.44–1.00)
Glucose, Bld: 102 mg/dL — ABNORMAL HIGH (ref 65–99)
Glucose, Bld: 105 mg/dL — ABNORMAL HIGH (ref 65–99)
Glucose, Bld: 87 mg/dL (ref 65–99)
Glucose, Bld: 114 mg/dL — ABNORMAL HIGH (ref 65–99)
Glucose, Bld: 108 mg/dL — ABNORMAL HIGH (ref 65–99)
Glucose, Bld: 141 mg/dL — ABNORMAL HIGH (ref 65–99)
HCT: 28 % — ABNORMAL LOW (ref 36.0–46.0)
HCT: 21 % — ABNORMAL LOW (ref 36.0–46.0)
HCT: 26 % — ABNORMAL LOW (ref 36.0–46.0)
HCT: 24 % — ABNORMAL LOW (ref 36.0–46.0)
HCT: 23 % — ABNORMAL LOW (ref 36.0–46.0)
HCT: 31 % — ABNORMAL LOW (ref 36.0–46.0)
Hemoglobin: 9.5 g/dL — ABNORMAL LOW (ref 12.0–15.0)
Hemoglobin: 7.1 g/dL — ABNORMAL LOW (ref 12.0–15.0)
Hemoglobin: 8.8 g/dL — ABNORMAL LOW (ref 12.0–15.0)
Hemoglobin: 8.2 g/dL — ABNORMAL LOW (ref 12.0–15.0)
Hemoglobin: 7.8 g/dL — ABNORMAL LOW (ref 12.0–15.0)
Hemoglobin: 10.5 g/dL — ABNORMAL LOW (ref 12.0–15.0)
Potassium: 3.8 mmol/L (ref 3.5–5.1)
Potassium: 3.5 mmol/L (ref 3.5–5.1)
Potassium: 3.6 mmol/L (ref 3.5–5.1)
Potassium: 5.1 mmol/L (ref 3.5–5.1)
Potassium: 4.6 mmol/L (ref 3.5–5.1)
Potassium: 4.4 mmol/L (ref 3.5–5.1)
Sodium: 138 mmol/L (ref 135–145)
Sodium: 137 mmol/L (ref 135–145)
Sodium: 140 mmol/L (ref 135–145)
Sodium: 136 mmol/L (ref 135–145)
Sodium: 137 mmol/L (ref 135–145)
Sodium: 140 mmol/L (ref 135–145)
TCO2: 28 mmol/L (ref 0–100)
TCO2: 27 mmol/L (ref 0–100)
TCO2: 28 mmol/L (ref 0–100)
TCO2: 25 mmol/L (ref 0–100)
TCO2: 26 mmol/L (ref 0–100)
TCO2: 22 mmol/L (ref 0–100)

## 2015-08-16 LAB — POCT I-STAT 3, ART BLOOD GAS (G3+)
Acid-Base Excess: 2 mmol/L (ref 0.0–2.0)
Acid-Base Excess: 2 mmol/L (ref 0.0–2.0)
Acid-base deficit: 3 mmol/L — ABNORMAL HIGH (ref 0.0–2.0)
Acid-base deficit: 3 mmol/L — ABNORMAL HIGH (ref 0.0–2.0)
Acid-base deficit: 4 mmol/L — ABNORMAL HIGH (ref 0.0–2.0)
Bicarbonate: 25.5 mEq/L — ABNORMAL HIGH (ref 20.0–24.0)
Bicarbonate: 26.4 mEq/L — ABNORMAL HIGH (ref 20.0–24.0)
Bicarbonate: 21.6 mEq/L (ref 20.0–24.0)
Bicarbonate: 22.7 mEq/L (ref 20.0–24.0)
Bicarbonate: 22.8 mEq/L (ref 20.0–24.0)
O2 Saturation: 100 %
O2 Saturation: 100 %
O2 Saturation: 98 %
O2 Saturation: 98 %
O2 Saturation: 96 %
Patient temperature: 35.7
Patient temperature: 35.9
Patient temperature: 36
TCO2: 26 mmol/L (ref 0–100)
TCO2: 28 mmol/L (ref 0–100)
TCO2: 23 mmol/L (ref 0–100)
TCO2: 24 mmol/L (ref 0–100)
TCO2: 24 mmol/L (ref 0–100)
pCO2 arterial: 32.2 mmHg — ABNORMAL LOW (ref 35.0–45.0)
pCO2 arterial: 40.6 mmHg (ref 35.0–45.0)
pCO2 arterial: 36.2 mmHg (ref 35.0–45.0)
pCO2 arterial: 41.9 mmHg (ref 35.0–45.0)
pCO2 arterial: 47.7 mmHg — ABNORMAL HIGH (ref 35.0–45.0)
pH, Arterial: 7.507 — ABNORMAL HIGH (ref 7.350–7.450)
pH, Arterial: 7.42 (ref 7.350–7.450)
pH, Arterial: 7.379 (ref 7.350–7.450)
pH, Arterial: 7.336 — ABNORMAL LOW (ref 7.350–7.450)
pH, Arterial: 7.283 — ABNORMAL LOW (ref 7.350–7.450)
pO2, Arterial: 372 mmHg — ABNORMAL HIGH (ref 80.0–100.0)
pO2, Arterial: 363 mmHg — ABNORMAL HIGH (ref 80.0–100.0)
pO2, Arterial: 106 mmHg — ABNORMAL HIGH (ref 80.0–100.0)
pO2, Arterial: 112 mmHg — ABNORMAL HIGH (ref 80.0–100.0)
pO2, Arterial: 91 mmHg (ref 80.0–100.0)

## 2015-08-16 LAB — CBC
HCT: 32 % — ABNORMAL LOW (ref 36.0–46.0)
HCT: 33.1 % — ABNORMAL LOW (ref 36.0–46.0)
Hemoglobin: 10.7 g/dL — ABNORMAL LOW (ref 12.0–15.0)
Hemoglobin: 11 g/dL — ABNORMAL LOW (ref 12.0–15.0)
MCH: 29.5 pg (ref 26.0–34.0)
MCH: 28.9 pg (ref 26.0–34.0)
MCHC: 33.4 g/dL (ref 30.0–36.0)
MCHC: 33.2 g/dL (ref 30.0–36.0)
MCV: 88.2 fL (ref 78.0–100.0)
MCV: 87.1 fL (ref 78.0–100.0)
Platelets: 125 10*3/uL — ABNORMAL LOW (ref 150–400)
Platelets: 129 10*3/uL — ABNORMAL LOW (ref 150–400)
RBC: 3.63 MIL/uL — ABNORMAL LOW (ref 3.87–5.11)
RBC: 3.8 MIL/uL — ABNORMAL LOW (ref 3.87–5.11)
RDW: 12.7 % (ref 11.5–15.5)
RDW: 13 % (ref 11.5–15.5)
WBC: 11.6 10*3/uL — ABNORMAL HIGH (ref 4.0–10.5)
WBC: 11.5 10*3/uL — ABNORMAL HIGH (ref 4.0–10.5)

## 2015-08-16 LAB — HEMOGLOBIN AND HEMATOCRIT, BLOOD
HCT: 24.9 % — ABNORMAL LOW (ref 36.0–46.0)
Hemoglobin: 8.3 g/dL — ABNORMAL LOW (ref 12.0–15.0)

## 2015-08-16 LAB — APTT: aPTT: 47 seconds — ABNORMAL HIGH (ref 24–37)

## 2015-08-16 LAB — PROTIME-INR
INR: 1.62 — ABNORMAL HIGH (ref 0.00–1.49)
Prothrombin Time: 19.3 seconds — ABNORMAL HIGH (ref 11.6–15.2)

## 2015-08-16 LAB — GLUCOSE, CAPILLARY
Glucose-Capillary: 126 mg/dL — ABNORMAL HIGH (ref 65–99)
Glucose-Capillary: 133 mg/dL — ABNORMAL HIGH (ref 65–99)
Glucose-Capillary: 127 mg/dL — ABNORMAL HIGH (ref 65–99)
Glucose-Capillary: 114 mg/dL — ABNORMAL HIGH (ref 65–99)

## 2015-08-16 LAB — PLATELET COUNT: Platelets: 132 10*3/uL — ABNORMAL LOW (ref 150–400)

## 2015-08-16 LAB — POCT I-STAT 4, (NA,K, GLUC, HGB,HCT)
Glucose, Bld: 118 mg/dL — ABNORMAL HIGH (ref 65–99)
HCT: 29 % — ABNORMAL LOW (ref 36.0–46.0)
Hemoglobin: 9.9 g/dL — ABNORMAL LOW (ref 12.0–15.0)
Potassium: 3.8 mmol/L (ref 3.5–5.1)
Sodium: 140 mmol/L (ref 135–145)

## 2015-08-16 LAB — MAGNESIUM: Magnesium: 2.8 mg/dL — ABNORMAL HIGH (ref 1.7–2.4)

## 2015-08-16 LAB — PREPARE RBC (CROSSMATCH)

## 2015-08-16 SURGERY — REPLACEMENT, AORTIC VALVE, OPEN
Anesthesia: General | Site: Chest

## 2015-08-16 MED ORDER — ACETAMINOPHEN 650 MG RE SUPP
650.0000 mg | Freq: Once | RECTAL | Status: AC
  Administered 2015-08-16: 650 mg via RECTAL

## 2015-08-16 MED ORDER — ATORVASTATIN CALCIUM 10 MG PO TABS
5.0000 mg | ORAL_TABLET | Freq: Every day | ORAL | Status: DC
  Administered 2015-08-18 – 2015-08-20 (×3): 5 mg via ORAL
  Filled 2015-08-16 (×3): qty 1

## 2015-08-16 MED ORDER — INSULIN REGULAR BOLUS VIA INFUSION
0.0000 [IU] | Freq: Three times a day (TID) | INTRAVENOUS | Status: DC
  Filled 2015-08-16: qty 10

## 2015-08-16 MED ORDER — MORPHINE SULFATE (PF) 2 MG/ML IV SOLN: 1.0000 mg | INTRAVENOUS | Status: AC | PRN

## 2015-08-16 MED ORDER — ACETAMINOPHEN 160 MG/5ML PO SOLN: 1000.0000 mg | Freq: Four times a day (QID) | ORAL | Status: DC

## 2015-08-16 MED ORDER — DEXTROSE 5 % IV SOLN
1.5000 g | INTRAVENOUS | Status: AC
  Administered 2015-08-16: .75 g via INTRAVENOUS
  Administered 2015-08-16: 1.5 g via INTRAVENOUS
  Filled 2015-08-16 (×2): qty 1.5

## 2015-08-16 MED ORDER — SODIUM CHLORIDE 0.9 % IV SOLN
250.0000 mL | INTRAVENOUS | Status: DC
  Administered 2015-08-16: 250 mL via INTRAVENOUS
  Administered 2015-08-17: 10:00:00 via INTRAVENOUS

## 2015-08-16 MED ORDER — MIDAZOLAM HCL 10 MG/2ML IJ SOLN
INTRAMUSCULAR | Status: AC
  Filled 2015-08-16: qty 2

## 2015-08-16 MED ORDER — TRAMADOL HCL 50 MG PO TABS
50.0000 mg | ORAL_TABLET | ORAL | Status: DC | PRN
  Administered 2015-08-17: 50 mg via ORAL
  Administered 2015-08-17: 100 mg via ORAL
  Administered 2015-08-18 (×2): 50 mg via ORAL
  Administered 2015-08-19: 100 mg via ORAL
  Filled 2015-08-16 (×2): qty 1
  Filled 2015-08-16: qty 2
  Filled 2015-08-16: qty 1
  Filled 2015-08-16: qty 2
  Filled 2015-08-16: qty 1

## 2015-08-16 MED ORDER — COQ-10 100 MG PO CAPS: 100.0000 mg | ORAL_CAPSULE | Freq: Every day | ORAL | Status: DC

## 2015-08-16 MED ORDER — ACETAMINOPHEN 160 MG/5ML PO SOLN: 650.0000 mg | Freq: Once | ORAL | Status: AC

## 2015-08-16 MED ORDER — CHLORHEXIDINE GLUCONATE 0.12 % MT SOLN
15.0000 mL | OROMUCOSAL | Status: AC
  Administered 2015-08-16: 15 mL via OROMUCOSAL

## 2015-08-16 MED ORDER — CEFAZOLIN SODIUM 1 G IJ SOLR
INTRAMUSCULAR | Status: AC
  Filled 2015-08-16: qty 20

## 2015-08-16 MED ORDER — PHENYLEPHRINE HCL 10 MG/ML IJ SOLN
30.0000 ug/min | INTRAVENOUS | Status: AC
  Administered 2015-08-16: 25 ug/min via INTRAVENOUS
  Filled 2015-08-16: qty 2

## 2015-08-16 MED ORDER — DOPAMINE-DEXTROSE 3.2-5 MG/ML-% IV SOLN
0.0000 ug/kg/min | INTRAVENOUS | Status: DC
  Filled 2015-08-16: qty 250

## 2015-08-16 MED ORDER — PROPOFOL 10 MG/ML IV BOLUS
INTRAVENOUS | Status: AC
  Filled 2015-08-16: qty 20

## 2015-08-16 MED ORDER — SODIUM CHLORIDE 0.9 % IJ SOLN
OROMUCOSAL | Status: DC | PRN
  Administered 2015-08-16 (×3): 4 mL via TOPICAL

## 2015-08-16 MED ORDER — FENTANYL CITRATE (PF) 250 MCG/5ML IJ SOLN
INTRAMUSCULAR | Status: AC
  Filled 2015-08-16: qty 5

## 2015-08-16 MED ORDER — METOPROLOL TARTRATE 5 MG/5ML IV SOLN
2.5000 mg | INTRAVENOUS | Status: DC | PRN
  Administered 2015-08-17: 5 mg via INTRAVENOUS
  Filled 2015-08-16: qty 5

## 2015-08-16 MED ORDER — HYDRALAZINE HCL 20 MG/ML IJ SOLN
INTRAMUSCULAR | Status: DC | PRN
  Administered 2015-08-16 (×2): 5 mg via INTRAVENOUS

## 2015-08-16 MED ORDER — ROCURONIUM BROMIDE 100 MG/10ML IV SOLN
INTRAVENOUS | Status: DC | PRN
  Administered 2015-08-16 (×2): 50 mg via INTRAVENOUS

## 2015-08-16 MED ORDER — DOCUSATE SODIUM 100 MG PO CAPS
200.0000 mg | ORAL_CAPSULE | Freq: Every day | ORAL | Status: DC
  Administered 2015-08-17 – 2015-08-20 (×4): 200 mg via ORAL
  Filled 2015-08-16 (×4): qty 2

## 2015-08-16 MED ORDER — MIDAZOLAM HCL 2 MG/2ML IJ SOLN: 2.0000 mg | INTRAMUSCULAR | Status: DC | PRN

## 2015-08-16 MED ORDER — HYDRALAZINE HCL 20 MG/ML IJ SOLN
INTRAMUSCULAR | Status: AC
  Filled 2015-08-16: qty 1

## 2015-08-16 MED ORDER — ONDANSETRON HCL 4 MG/2ML IJ SOLN
4.0000 mg | Freq: Four times a day (QID) | INTRAMUSCULAR | Status: DC | PRN
  Administered 2015-08-16 – 2015-08-18 (×5): 4 mg via INTRAVENOUS
  Filled 2015-08-16 (×5): qty 2

## 2015-08-16 MED ORDER — MIDAZOLAM HCL 5 MG/5ML IJ SOLN
INTRAMUSCULAR | Status: DC | PRN
  Administered 2015-08-16: 1 mg via INTRAVENOUS
  Administered 2015-08-16: 2 mg via INTRAVENOUS
  Administered 2015-08-16: 3 mg via INTRAVENOUS
  Administered 2015-08-16 (×2): 2 mg via INTRAVENOUS

## 2015-08-16 MED ORDER — ALBUMIN HUMAN 5 % IV SOLN
INTRAVENOUS | Status: DC | PRN
  Administered 2015-08-16 (×2): via INTRAVENOUS

## 2015-08-16 MED ORDER — MAGNESIUM SULFATE 4 GM/100ML IV SOLN
4.0000 g | Freq: Once | INTRAVENOUS | Status: AC
  Administered 2015-08-16: 4 g via INTRAVENOUS
  Filled 2015-08-16: qty 100

## 2015-08-16 MED ORDER — LACTATED RINGERS IV SOLN
INTRAVENOUS | Status: DC
  Administered 2015-08-16: 12:00:00 via INTRAVENOUS

## 2015-08-16 MED ORDER — DEXMEDETOMIDINE HCL IN NACL 400 MCG/100ML IV SOLN
0.1000 ug/kg/h | INTRAVENOUS | Status: AC
  Administered 2015-08-16: .2 ug/kg/h via INTRAVENOUS
  Filled 2015-08-16: qty 100

## 2015-08-16 MED ORDER — LACTATED RINGERS IV SOLN
INTRAVENOUS | Status: DC | PRN
  Administered 2015-08-16: 07:00:00 via INTRAVENOUS

## 2015-08-16 MED ORDER — SODIUM CHLORIDE 0.9 % IV SOLN
INTRAVENOUS | Status: AC
  Administered 2015-08-16: 1 [IU]/h via INTRAVENOUS
  Filled 2015-08-16: qty 2.5

## 2015-08-16 MED ORDER — HEPARIN SODIUM (PORCINE) 1000 UNIT/ML IJ SOLN
INTRAMUSCULAR | Status: AC
  Filled 2015-08-16: qty 1

## 2015-08-16 MED ORDER — DOPAMINE-DEXTROSE 3.2-5 MG/ML-% IV SOLN
0.0000 ug/kg/min | INTRAVENOUS | Status: DC
Start: 2015-08-16 — End: 2015-08-17

## 2015-08-16 MED ORDER — ARTIFICIAL TEARS OP OINT
TOPICAL_OINTMENT | OPHTHALMIC | Status: AC
  Filled 2015-08-16: qty 3.5

## 2015-08-16 MED ORDER — DEXMEDETOMIDINE HCL IN NACL 200 MCG/50ML IV SOLN
0.0000 ug/kg/h | INTRAVENOUS | Status: DC
  Administered 2015-08-16: 0.7 ug/kg/h via INTRAVENOUS
  Filled 2015-08-16 (×2): qty 50

## 2015-08-16 MED ORDER — FENTANYL CITRATE (PF) 100 MCG/2ML IJ SOLN
INTRAMUSCULAR | Status: DC | PRN
  Administered 2015-08-16: 250 ug via INTRAVENOUS
  Administered 2015-08-16 (×3): 100 ug via INTRAVENOUS
  Administered 2015-08-16 (×2): 150 ug via INTRAVENOUS
  Administered 2015-08-16: 200 ug via INTRAVENOUS
  Administered 2015-08-16: 50 ug via INTRAVENOUS
  Administered 2015-08-16: 250 ug via INTRAVENOUS
  Administered 2015-08-16: 100 ug via INTRAVENOUS
  Administered 2015-08-16: 250 ug via INTRAVENOUS

## 2015-08-16 MED ORDER — NITROGLYCERIN IN D5W 200-5 MCG/ML-% IV SOLN
2.0000 ug/min | INTRAVENOUS | Status: AC
  Administered 2015-08-16: 16.6 ug/min via INTRAVENOUS
  Filled 2015-08-16: qty 250

## 2015-08-16 MED ORDER — SODIUM CHLORIDE 0.9% FLUSH: 3.0000 mL | INTRAVENOUS | Status: DC | PRN

## 2015-08-16 MED ORDER — METOPROLOL TARTRATE 12.5 MG HALF TABLET
12.5000 mg | ORAL_TABLET | Freq: Two times a day (BID) | ORAL | Status: DC
Start: 2015-08-16 — End: 2015-08-21
  Administered 2015-08-17 – 2015-08-21 (×9): 12.5 mg via ORAL
  Filled 2015-08-16 (×9): qty 1

## 2015-08-16 MED ORDER — DEXTROSE 5 % IV SOLN
0.0000 ug/min | INTRAVENOUS | Status: DC
  Administered 2015-08-16: 5.333 ug/min via INTRAVENOUS
  Filled 2015-08-16: qty 2

## 2015-08-16 MED ORDER — SODIUM CHLORIDE 0.9 % IJ SOLN
INTRAMUSCULAR | Status: AC
  Filled 2015-08-16: qty 20

## 2015-08-16 MED ORDER — PANTOPRAZOLE SODIUM 40 MG PO TBEC
80.0000 mg | DELAYED_RELEASE_TABLET | Freq: Every day | ORAL | Status: DC
  Administered 2015-08-17 – 2015-08-21 (×5): 80 mg via ORAL
  Filled 2015-08-16 (×5): qty 2

## 2015-08-16 MED ORDER — METOPROLOL TARTRATE 25 MG/10 ML ORAL SUSPENSION: 12.5000 mg | Freq: Two times a day (BID) | ORAL | Status: DC

## 2015-08-16 MED ORDER — ASPIRIN 81 MG PO CHEW: 324.0000 mg | CHEWABLE_TABLET | Freq: Every day | ORAL | Status: DC

## 2015-08-16 MED ORDER — VECURONIUM BROMIDE 10 MG IV SOLR
INTRAVENOUS | Status: DC | PRN
  Administered 2015-08-16 (×2): 5 mg via INTRAVENOUS

## 2015-08-16 MED ORDER — FENTANYL CITRATE (PF) 250 MCG/5ML IJ SOLN
INTRAMUSCULAR | Status: AC
  Filled 2015-08-16: qty 25

## 2015-08-16 MED ORDER — ADULT MULTIVITAMIN W/MINERALS CH
1.0000 | ORAL_TABLET | Freq: Every day | ORAL | Status: DC
  Administered 2015-08-17 – 2015-08-21 (×5): 1 via ORAL
  Filled 2015-08-16 (×5): qty 1

## 2015-08-16 MED ORDER — ASPIRIN EC 325 MG PO TBEC
325.0000 mg | DELAYED_RELEASE_TABLET | Freq: Every day | ORAL | Status: DC
  Administered 2015-08-17 – 2015-08-21 (×5): 325 mg via ORAL
  Filled 2015-08-16 (×5): qty 1

## 2015-08-16 MED ORDER — OMEGA-3-ACID ETHYL ESTERS 1 G PO CAPS
2.0000 g | ORAL_CAPSULE | Freq: Every day | ORAL | Status: DC
Start: 2015-08-18 — End: 2015-08-17

## 2015-08-16 MED ORDER — PROTAMINE SULFATE 10 MG/ML IV SOLN
INTRAVENOUS | Status: DC | PRN
  Administered 2015-08-16 (×3): 30 mg via INTRAVENOUS
  Administered 2015-08-16: 50 mg via INTRAVENOUS
  Administered 2015-08-16: 30 mg via INTRAVENOUS

## 2015-08-16 MED ORDER — MAGNESIUM SULFATE 50 % IJ SOLN
40.0000 meq | INTRAMUSCULAR | Status: DC
  Filled 2015-08-16: qty 10

## 2015-08-16 MED ORDER — CHLORHEXIDINE GLUCONATE 0.12% ORAL RINSE (MEDLINE KIT)
15.0000 mL | Freq: Two times a day (BID) | OROMUCOSAL | Status: DC
  Administered 2015-08-16 – 2015-08-17 (×3): 15 mL via OROMUCOSAL

## 2015-08-16 MED ORDER — ANTISEPTIC ORAL RINSE SOLUTION (CORINZ)
7.0000 mL | Freq: Four times a day (QID) | OROMUCOSAL | Status: DC
  Administered 2015-08-16 – 2015-08-17 (×3): 7 mL via OROMUCOSAL

## 2015-08-16 MED ORDER — SODIUM CHLORIDE 0.45 % IV SOLN
INTRAVENOUS | Status: DC | PRN
Start: 2015-08-16 — End: 2015-08-17
  Administered 2015-08-16: 12:00:00 via INTRAVENOUS

## 2015-08-16 MED ORDER — POTASSIUM CHLORIDE 2 MEQ/ML IV SOLN
80.0000 meq | INTRAVENOUS | Status: DC
  Filled 2015-08-16: qty 40

## 2015-08-16 MED ORDER — ONDANSETRON HCL 4 MG/2ML IJ SOLN
INTRAMUSCULAR | Status: AC
  Filled 2015-08-16: qty 2

## 2015-08-16 MED ORDER — POTASSIUM CHLORIDE 10 MEQ/50ML IV SOLN
10.0000 meq | INTRAVENOUS | Status: AC
  Administered 2015-08-16 (×3): 10 meq via INTRAVENOUS

## 2015-08-16 MED ORDER — SODIUM CHLORIDE 0.9 % IV SOLN: INTRAVENOUS | Status: DC

## 2015-08-16 MED ORDER — ROCURONIUM BROMIDE 50 MG/5ML IV SOLN
INTRAVENOUS | Status: AC
  Filled 2015-08-16: qty 3

## 2015-08-16 MED ORDER — LACTATED RINGERS IV SOLN
INTRAVENOUS | Status: DC | PRN
Start: 2015-08-16 — End: 2015-08-16
  Administered 2015-08-16 (×2): via INTRAVENOUS

## 2015-08-16 MED ORDER — PROTAMINE SULFATE 10 MG/ML IV SOLN
INTRAVENOUS | Status: AC
  Filled 2015-08-16: qty 25

## 2015-08-16 MED ORDER — ARTIFICIAL TEARS OP OINT
TOPICAL_OINTMENT | OPHTHALMIC | Status: DC | PRN
  Administered 2015-08-16: 1 via OPHTHALMIC

## 2015-08-16 MED ORDER — PLASMA-LYTE 148 IV SOLN
INTRAVENOUS | Status: AC
  Administered 2015-08-16: 500 mL
  Filled 2015-08-16: qty 2.5

## 2015-08-16 MED ORDER — CHLORHEXIDINE GLUCONATE 4 % EX LIQD: 30.0000 mL | CUTANEOUS | Status: DC

## 2015-08-16 MED ORDER — SODIUM CHLORIDE 0.9 % IV SOLN
INTRAVENOUS | Status: DC
  Administered 2015-08-16: 0.5 [IU]/h via INTRAVENOUS
  Filled 2015-08-16: qty 2.5

## 2015-08-16 MED ORDER — CEFUROXIME SODIUM 1.5 G IJ SOLR
1.5000 g | Freq: Two times a day (BID) | INTRAMUSCULAR | Status: AC
  Administered 2015-08-16 – 2015-08-18 (×4): 1.5 g via INTRAVENOUS
  Filled 2015-08-16 (×4): qty 1.5

## 2015-08-16 MED ORDER — HEPARIN SODIUM (PORCINE) 1000 UNIT/ML IJ SOLN
INTRAMUSCULAR | Status: DC | PRN
  Administered 2015-08-16: 24000 [IU] via INTRAVENOUS

## 2015-08-16 MED ORDER — BIOTIN 5000 MCG PO CAPS: 5000.0000 ug | ORAL_CAPSULE | Freq: Two times a day (BID) | ORAL | Status: DC

## 2015-08-16 MED ORDER — FAMOTIDINE IN NACL 20-0.9 MG/50ML-% IV SOLN
20.0000 mg | Freq: Once | INTRAVENOUS | Status: AC
  Administered 2015-08-16: 20 mg via INTRAVENOUS

## 2015-08-16 MED ORDER — NITROGLYCERIN IN D5W 200-5 MCG/ML-% IV SOLN
0.0000 ug/min | INTRAVENOUS | Status: DC
  Administered 2015-08-16: 20 ug/min via INTRAVENOUS
  Filled 2015-08-16: qty 250

## 2015-08-16 MED ORDER — VECURONIUM BROMIDE 10 MG IV SOLR
INTRAVENOUS | Status: AC
  Filled 2015-08-16: qty 10

## 2015-08-16 MED ORDER — BISACODYL 10 MG RE SUPP: 10.0000 mg | Freq: Every day | RECTAL | Status: DC

## 2015-08-16 MED ORDER — DEXTROSE 5 % IV SOLN
750.0000 mg | INTRAVENOUS | Status: DC
  Filled 2015-08-16: qty 750

## 2015-08-16 MED ORDER — VANCOMYCIN HCL IN DEXTROSE 1-5 GM/200ML-% IV SOLN
1000.0000 mg | Freq: Once | INTRAVENOUS | Status: AC
  Administered 2015-08-16: 1000 mg via INTRAVENOUS
  Filled 2015-08-16: qty 200

## 2015-08-16 MED ORDER — ACETAMINOPHEN 500 MG PO TABS
1000.0000 mg | ORAL_TABLET | Freq: Four times a day (QID) | ORAL | Status: DC
  Administered 2015-08-17 – 2015-08-21 (×14): 1000 mg via ORAL
  Filled 2015-08-16 (×17): qty 2

## 2015-08-16 MED ORDER — SODIUM CHLORIDE 0.9 % IV SOLN
INTRAVENOUS | Status: DC | PRN
  Administered 2015-08-16: 11:00:00 via INTRAVENOUS

## 2015-08-16 MED ORDER — VANCOMYCIN HCL 10 G IV SOLR
1250.0000 mg | INTRAVENOUS | Status: AC
  Administered 2015-08-16: 1250 mg via INTRAVENOUS
  Filled 2015-08-16: qty 1250

## 2015-08-16 MED ORDER — HEPARIN SODIUM (PORCINE) 1000 UNIT/ML IJ SOLN
INTRAMUSCULAR | Status: DC
  Filled 2015-08-16: qty 30

## 2015-08-16 MED ORDER — SODIUM CHLORIDE 0.9 % IV SOLN
INTRAVENOUS | Status: AC
  Administered 2015-08-16: 69.8 mL/h via INTRAVENOUS
  Filled 2015-08-16: qty 40

## 2015-08-16 MED ORDER — CALCIUM CHLORIDE 10 % IV SOLN
1.0000 g | Freq: Once | INTRAVENOUS | Status: AC
  Administered 2015-08-16: 1 g via INTRAVENOUS

## 2015-08-16 MED ORDER — HEMOSTATIC AGENTS (NO CHARGE) OPTIME
TOPICAL | Status: DC | PRN
  Administered 2015-08-16 (×2): 1 via TOPICAL

## 2015-08-16 MED ORDER — MORPHINE SULFATE (PF) 2 MG/ML IV SOLN
2.0000 mg | INTRAVENOUS | Status: DC | PRN
  Administered 2015-08-16 – 2015-08-17 (×2): 2 mg via INTRAVENOUS
  Filled 2015-08-16 (×2): qty 1

## 2015-08-16 MED ORDER — 0.9 % SODIUM CHLORIDE (POUR BTL) OPTIME
TOPICAL | Status: DC | PRN
  Administered 2015-08-16: 3000 mL

## 2015-08-16 MED ORDER — ALBUMIN HUMAN 5 % IV SOLN
250.0000 mL | INTRAVENOUS | Status: AC | PRN
  Administered 2015-08-16 (×3): 250 mL via INTRAVENOUS
  Filled 2015-08-16: qty 250

## 2015-08-16 MED ORDER — SODIUM CHLORIDE 0.9% FLUSH
3.0000 mL | Freq: Two times a day (BID) | INTRAVENOUS | Status: DC
  Administered 2015-08-17 – 2015-08-20 (×4): 3 mL via INTRAVENOUS

## 2015-08-16 MED ORDER — BISACODYL 5 MG PO TBEC
10.0000 mg | DELAYED_RELEASE_TABLET | Freq: Every day | ORAL | Status: DC
  Administered 2015-08-17 – 2015-08-20 (×4): 10 mg via ORAL
  Filled 2015-08-16 (×4): qty 2

## 2015-08-16 MED ORDER — LACTATED RINGERS IV SOLN: 500.0000 mL | Freq: Once | INTRAVENOUS | Status: DC | PRN

## 2015-08-16 MED ORDER — DEXTROSE 5 % IV SOLN
0.0000 ug/min | INTRAVENOUS | Status: DC
  Filled 2015-08-16: qty 4

## 2015-08-16 MED FILL — Heparin Sodium (Porcine) Inj 1000 Unit/ML: INTRAMUSCULAR | Qty: 10 | Status: AC

## 2015-08-16 MED FILL — Lidocaine HCl IV Inj 20 MG/ML: INTRAVENOUS | Qty: 5 | Status: AC

## 2015-08-16 MED FILL — Sodium Bicarbonate IV Soln 8.4%: INTRAVENOUS | Qty: 50 | Status: AC

## 2015-08-16 MED FILL — Sodium Chloride IV Soln 0.9%: INTRAVENOUS | Qty: 2000 | Status: AC

## 2015-08-16 MED FILL — Mannitol IV Soln 20%: INTRAVENOUS | Qty: 500 | Status: AC

## 2015-08-16 MED FILL — Electrolyte-R (PH 7.4) Solution: INTRAVENOUS | Qty: 4000 | Status: AC

## 2015-08-16 SURGICAL SUPPLY — 75 items
ADAPTER CARDIO PERF ANTE/RETRO (ADAPTER) ×4 IMPLANT
ADH SRG 12 PREFL SYR 3 SPRDR (MISCELLANEOUS)
ADPR PRFSN 84XANTGRD RTRGD (ADAPTER) ×2
BAG DECANTER FOR FLEXI CONT (MISCELLANEOUS) ×4 IMPLANT
BLADE STERNUM SYSTEM 6 (BLADE) ×6 IMPLANT
BLADE SURG 12 STRL SS (BLADE) ×4 IMPLANT
BLADE SURG 15 STRL LF DISP TIS (BLADE) ×2 IMPLANT
BLADE SURG 15 STRL SS (BLADE) ×4
CANISTER SUCTION 2500CC (MISCELLANEOUS) ×4 IMPLANT
CANNULA GUNDRY RCSP 15FR (MISCELLANEOUS) ×4 IMPLANT
CATH CPB KIT VANTRIGT (MISCELLANEOUS) ×4 IMPLANT
CATH HEART VENT LEFT (CATHETERS) ×2 IMPLANT
CATH RETROPLEGIA CORONARY 14FR (CATHETERS) IMPLANT
CATH ROBINSON RED A/P 18FR (CATHETERS) ×12 IMPLANT
CATH THORACIC 36FR RT ANG (CATHETERS) ×4 IMPLANT
CLIP FOGARTY SPRING 6M (CLIP) IMPLANT
COVER SURGICAL LIGHT HANDLE (MISCELLANEOUS) ×8 IMPLANT
CRADLE DONUT ADULT HEAD (MISCELLANEOUS) ×4 IMPLANT
DRAIN CHANNEL 32F RND 10.7 FF (WOUND CARE) ×4 IMPLANT
DRAPE CARDIOVASCULAR INCISE (DRAPES) ×4
DRAPE SLUSH/WARMER DISC (DRAPES) ×4 IMPLANT
DRAPE SRG 135X102X78XABS (DRAPES) ×2 IMPLANT
DRSG AQUACEL AG ADV 3.5X14 (GAUZE/BANDAGES/DRESSINGS) ×4 IMPLANT
ELECT BLADE 4.0 EZ CLEAN MEGAD (MISCELLANEOUS) ×4
ELECT BLADE 6.5 EXT (BLADE) ×4 IMPLANT
ELECT CAUTERY BLADE 6.4 (BLADE) ×4 IMPLANT
ELECT REM PT RETURN 9FT ADLT (ELECTROSURGICAL) ×8
ELECTRODE BLDE 4.0 EZ CLN MEGD (MISCELLANEOUS) ×2 IMPLANT
ELECTRODE REM PT RTRN 9FT ADLT (ELECTROSURGICAL) ×4 IMPLANT
FELT TEFLON 1X6 (MISCELLANEOUS) ×4 IMPLANT
GAUZE SPONGE 4X4 12PLY STRL (GAUZE/BANDAGES/DRESSINGS) ×8 IMPLANT
GLOVE BIO SURGEON STRL SZ7.5 (GLOVE) ×8 IMPLANT
GOWN STRL REUS W/ TWL LRG LVL3 (GOWN DISPOSABLE) ×8 IMPLANT
GOWN STRL REUS W/TWL LRG LVL3 (GOWN DISPOSABLE) ×16
HEMOSTAT POWDER SURGIFOAM 1G (HEMOSTASIS) ×12 IMPLANT
HEMOSTAT SURGICEL 2X14 (HEMOSTASIS) ×6 IMPLANT
INSERT FOGARTY XLG (MISCELLANEOUS) IMPLANT
KIT BASIN OR (CUSTOM PROCEDURE TRAY) ×4 IMPLANT
KIT ROOM TURNOVER OR (KITS) ×4 IMPLANT
KIT SUCTION CATH 14FR (SUCTIONS) ×4 IMPLANT
LEAD PACING MYOCARDI (MISCELLANEOUS) ×4 IMPLANT
LINE VENT (MISCELLANEOUS) ×2 IMPLANT
NS IRRIG 1000ML POUR BTL (IV SOLUTION) ×24 IMPLANT
PACK OPEN HEART (CUSTOM PROCEDURE TRAY) ×4 IMPLANT
PAD ARMBOARD 7.5X6 YLW CONV (MISCELLANEOUS) ×8 IMPLANT
SET CARDIOPLEGIA MPS 5001102 (MISCELLANEOUS) ×2 IMPLANT
SPONGE GAUZE 4X4 12PLY STER LF (GAUZE/BANDAGES/DRESSINGS) ×2 IMPLANT
SURGIFLO W/THROMBIN 8M KIT (HEMOSTASIS) ×6 IMPLANT
SUT BONE WAX W31G (SUTURE) ×4 IMPLANT
SUT ETHIBON 2 0 V 52N 30 (SUTURE) ×12 IMPLANT
SUT ETHIBOND 2 0 SH (SUTURE) ×4
SUT ETHIBOND 2 0 SH 36X2 (SUTURE) ×2 IMPLANT
SUT PROLENE 3 0 RB 1 (SUTURE) ×6 IMPLANT
SUT PROLENE 3 0 SH 1 (SUTURE) IMPLANT
SUT PROLENE 3 0 SH DA (SUTURE) IMPLANT
SUT PROLENE 4 0 RB 1 (SUTURE) ×24
SUT PROLENE 4 0 SH DA (SUTURE) ×6 IMPLANT
SUT PROLENE 4-0 RB1 .5 CRCL 36 (SUTURE) ×6 IMPLANT
SUT PROLENE 6 0 C 1 30 (SUTURE) ×2 IMPLANT
SUT STEEL 6MS V (SUTURE) ×8 IMPLANT
SUT STEEL SZ 6 DBL 3X14 BALL (SUTURE) ×4 IMPLANT
SUT VIC AB 1 CTX 36 (SUTURE) ×8
SUT VIC AB 1 CTX36XBRD ANBCTR (SUTURE) ×4 IMPLANT
SUT VIC AB 2-0 CTX 27 (SUTURE) IMPLANT
SUT VIC AB 3-0 X1 27 (SUTURE) IMPLANT
SYR 10ML KIT SKIN ADHESIVE (MISCELLANEOUS) IMPLANT
SYSTEM SAHARA CHEST DRAIN ATS (WOUND CARE) ×4 IMPLANT
TAPE CLOTH SURG 6X10 WHT LF (GAUZE/BANDAGES/DRESSINGS) ×2 IMPLANT
TOWEL OR 17X24 6PK STRL BLUE (TOWEL DISPOSABLE) ×8 IMPLANT
TOWEL OR 17X26 10 PK STRL BLUE (TOWEL DISPOSABLE) ×8 IMPLANT
TRAY FOLEY IC TEMP SENS 16FR (CATHETERS) ×4 IMPLANT
UNDERPAD 30X30 INCONTINENT (UNDERPADS AND DIAPERS) ×4 IMPLANT
VALVE MAGNA EASE 21MM (Prosthesis & Implant Heart) ×2 IMPLANT
VENT LEFT HEART 12002 (CATHETERS) ×12
WATER STERILE IRR 1000ML POUR (IV SOLUTION) ×8 IMPLANT

## 2015-08-16 NOTE — Progress Notes (Signed)
Patient ID: Heidi Middleton, female   DOB: 1945/11/18, 70 y.o.   MRN: [id number]   SICU Evening Rounds:   Hemodynamically stable  CI = 2.9  Has started to wake up on vent but sleepy   Urine output good  CT output low  CBC    Component Value Date/Time   WBC 11.6* 08/16/2015 1200   WBC 5.9 07/28/2015 1426   RBC 3.63* 08/16/2015 1200   RBC 4.12 07/28/2015 1426   HGB 9.9* 08/16/2015 1207   HCT 29.0* 08/16/2015 1207   HCT 36.3 07/28/2015 1426   PLT 125* 08/16/2015 1200   PLT 250 07/28/2015 1426   MCV 88.2 08/16/2015 1200   MCV 88 07/28/2015 1426   MCH 29.5 08/16/2015 1200   MCH 29.1 07/28/2015 1426   MCHC 33.4 08/16/2015 1200   MCHC 33.1 07/28/2015 1426   RDW 12.7 08/16/2015 1200   RDW 13.9 07/28/2015 1426   LYMPHSABS 1.9 07/28/2015 1426   LYMPHSABS 1.8 06/20/2015 0826   MONOABS 0.6 06/20/2015 0826   EOSABS 0.1 07/28/2015 1426   EOSABS 0.1 06/20/2015 0826   BASOSABS 0.0 07/28/2015 1426   BASOSABS 0.1 06/20/2015 0826     BMET    Component Value Date/Time   NA 140 08/16/2015 1207   NA 143 07/28/2015 1426   K 3.8 08/16/2015 1207   CL 102 08/16/2015 1055   CO2 23 08/11/2015 0957   GLUCOSE 118* 08/16/2015 1207   GLUCOSE 109* 07/28/2015 1426   BUN 15 08/16/2015 1055   BUN 19 07/28/2015 1426   CREATININE 0.60 08/16/2015 1055   CALCIUM 10.6* 08/11/2015 0957   GFRNONAA 46* 08/11/2015 0957   GFRAA 53* 08/11/2015 0957     A/P:  Stable postop course. Continue current plans. Wean vent as tolerated.

## 2015-08-16 NOTE — Procedures (Signed)
Extubation Procedure Note  Patient Details:   Name: Heidi Middleton DOB: [birth year] MRN: [id number]  Patient extubated to 4L [place]. Patient was able to do VC 1.2L and -20 NIF. Patient has no stridor present. Patient able to speak. Patient has cuff leak.     Evaluation  O2 sats: stable throughout Complications: No apparent complications Patient did tolerate procedure well. Bilateral Breath Sounds: Clear, Diminished   Yes  Kelle Darting 08/16/2015, 2015

## 2015-08-16 NOTE — Progress Notes (Signed)
Rapid cardiac wean started.

## 2015-08-16 NOTE — Progress Notes (Signed)
Warming blanket applied.

## 2015-08-16 NOTE — Progress Notes (Signed)
  Echocardiogram Echocardiogram Transesophageal has been performed.  Heidi Middleton R 08/16/2015, 10:06 AM

## 2015-08-16 NOTE — Transfer of Care (Signed)
Immediate Anesthesia Transfer of Care Note  Patient: Heidi Middleton  Procedure(s) Performed: Procedure(s): AORTIC VALVE REPLACEMENT (AVR) (N/A) TRANSESOPHAGEAL ECHOCARDIOGRAM (TEE) (N/A)  Patient Location: SICU  Anesthesia Type:General  Level of Consciousness: Patient remains intubated per anesthesia plan  Airway & Oxygen Therapy: Patient remains intubated per anesthesia plan and Patient placed on Ventilator (see vital sign flow sheet for setting)  Post-op Assessment: Report given to RN and Post -op Vital signs reviewed and stable  Post vital signs: Reviewed and stable  Last Vitals:  Filed Vitals:   08/16/15 0715 08/16/15 0716  BP:    Pulse: 62 60  Temp:    Resp: 14 12    Last Pain:  Filed Vitals:   08/16/15 0609  PainSc: 1       Patients Stated Pain Goal: 2 ([id number])  Complications: No apparent anesthesia complications

## 2015-08-16 NOTE — Anesthesia Preprocedure Evaluation (Addendum)
Anesthesia Evaluation  Patient identified by MRN, date of birth, ID band Patient awake    Reviewed: Allergy & Precautions, H&P , NPO status , Patient's Chart, lab work & pertinent test results  History of Anesthesia Complications (+) PONV and history of anesthetic complications  Airway Mallampati: II  TM Distance: >3 FB Neck ROM: full    Dental  (+) Dental Advidsory Given, Teeth Intact   Pulmonary asthma , former smoker,    breath sounds clear to auscultation       Cardiovascular hypertension, + Valvular Problems/Murmurs AS  Rhythm:regular Rate:Normal  Severe aortic valve stenosis.  Preserved LV function.   Neuro/Psych  Headaches,    GI/Hepatic GERD  ,  Endo/Other    Renal/GU      Musculoskeletal  (+) Arthritis ,   Abdominal   Peds  Hematology   Anesthesia Other Findings   Reproductive/Obstetrics                           Anesthesia Physical Anesthesia Plan  ASA: III  Anesthesia Plan: General   Post-op Pain Management:    Induction: Intravenous  Airway Management Planned: Oral ETT  Additional Equipment: Arterial line, CVP, PA Cath, TEE and Ultrasound Guidance Line Placement  Intra-op Plan:   Post-operative Plan: Post-operative intubation/ventilation  Informed Consent: I have reviewed the patients History and Physical, chart, labs and discussed the procedure including the risks, benefits and alternatives for the proposed anesthesia with the patient or authorized representative who has indicated his/her understanding and acceptance.   Dental Advisory Given  Plan Discussed with: Anesthesiologist, CRNA and Surgeon  Anesthesia Plan Comments:        Anesthesia Quick Evaluation

## 2015-08-16 NOTE — Progress Notes (Signed)
RT attempted rapid cardiac wean. Patient failed due to very minimal patient effort.  RR < 4

## 2015-08-16 NOTE — Progress Notes (Signed)
Patient placed back on full vent support due to decreased patient effort. RN aware.

## 2015-08-16 NOTE — Progress Notes (Signed)
The patient was examined and preop studies reviewed. There has been no change from the prior exam and the patient is ready for surgery.  plan AVR on [employer]

## 2015-08-16 NOTE — Anesthesia Procedure Notes (Addendum)
Central Venous Catheter Insertion Performed by: anesthesiologist 08/16/2015 6:49 AM Preanesthetic checklist: patient identified, IV checked, risks and benefits discussed, surgical consent, monitors and equipment checked, pre-op evaluation and timeout performed Position: supine Lidocaine 1% used for infiltration Landmarks identified Catheter size: 8.5 Fr PA cath was placed.Sheath introducer Procedure performed using ultrasound guided technique. Attempts: 1 Following insertion, line sutured and dressing applied. Post procedure assessment: blood return through all ports, free fluid flow and no air. Patient tolerated the procedure well with no immediate complications. Additional procedure comments: PA catheter:  Routine monitors. Timeout, sterile prep, drape, FBP R neck.  Supine position.  1% Lido local, finder and trocar RIJ 1st pass with US guidance.  Cordis placed over J wire. PA catheter in easily.  Sterile dressing applied.  Patient tolerated well, VSS.  Jenita Seashore, MD.    Procedure Name: Intubation Date/Time: 08/16/2015 7:45 AM Performed by: Neldon Newport Pre-anesthesia Checklist: Patient being monitored, Suction available, Emergency Drugs available, Patient identified and Timeout performed Patient Re-evaluated:Patient Re-evaluated prior to inductionOxygen Delivery Method: Circle system utilized Preoxygenation: Pre-oxygenation with 100% oxygen Intubation Type: IV induction Ventilation: Mask ventilation without difficulty Laryngoscope Size: Mac and 3 Grade View: Grade I Tube type: Oral Tube size: 8.0 mm Number of attempts: 1 Placement Confirmation: positive ETCO2,  ETT inserted through vocal cords under direct vision and breath sounds checked- equal and bilateral Secured at: 22 cm Tube secured with: Tape Dental Injury: Teeth and Oropharynx as per pre-operative assessment

## 2015-08-16 NOTE — Progress Notes (Signed)
Called RT about starting weaning. Will continue to monitor.

## 2015-08-16 NOTE — Brief Op Note (Signed)
08/16/2015  10:34 AM  PATIENT:  Heidi Middleton  70 y.o. female  PRE-OPERATIVE DIAGNOSIS:  Aortic Stenosis  POST-OPERATIVE DIAGNOSIS:  Aortic Stenosis  PROCEDURE:  TRANSESOPHAGEAL ECHOCARDIOGRAM (TEE), MEDIAN STERNOTOMY for AORTIC VALVE REPLACEMENT (AVR)   SURGEON:  Surgeon(s) and Role:    * Ivin Poot, MD - Primary  PHYSICIAN ASSISTANT: Lars Pinks PA-C  ANESTHESIA:   general  EBL:  Total I/O In: 1500 [I.V.:1500] Out: 900 [Urine:900]  BLOOD ADMINISTERED:Two CC PRBC  DRAINS: Chest tubes placed in the mediastinal and pleural spaces   SPECIMEN:  Source of Specimen:  Native aortic valve leaflets  DISPOSITION OF SPECIMEN:  PATHOLOGY  COUNTS CORRECT:  YES  DICTATION: .Dragon Dictation  PLAN OF CARE: Admit to inpatient   PATIENT DISPOSITION:  ICU - intubated and hemodynamically stable.   Delay start of Pharmacological VTE agent (>24hrs) due to surgical blood loss or risk of bleeding: yes  BASELINE WEIGHT: 75 kg  Aortic Valve Etiology   Aortic Insufficiency:  Trivial/Trace  Aortic Valve Disease:  Yes.  Aortic Stenosis:  Yes. Smallest Aortic Valve Area: 0.6cm2; Highest Mean Gradient: 32mmHg.  Etiology (Choose at least one and up to  5 etiologies):  Degenerative - Calcified  Aortic Valve  Procedure Performed:  Replacement: Yes.  Bioprosthetic Valve. Implant Model Number:[id number], Size:21, Unique Device Identifier:[id number]  Repair/Reconstruction: No.   Aortic Annular Enlargement: No.

## 2015-08-17 ENCOUNTER — Inpatient Hospital Stay (HOSPITAL_COMMUNITY): Payer: Medicare Other

## 2015-08-17 LAB — GLUCOSE, CAPILLARY
Glucose-Capillary: 115 mg/dL — ABNORMAL HIGH (ref 65–99)
Glucose-Capillary: 119 mg/dL — ABNORMAL HIGH (ref 65–99)
Glucose-Capillary: 121 mg/dL — ABNORMAL HIGH (ref 65–99)
Glucose-Capillary: 121 mg/dL — ABNORMAL HIGH (ref 65–99)
Glucose-Capillary: 124 mg/dL — ABNORMAL HIGH (ref 65–99)
Glucose-Capillary: 126 mg/dL — ABNORMAL HIGH (ref 65–99)
Glucose-Capillary: 127 mg/dL — ABNORMAL HIGH (ref 65–99)
Glucose-Capillary: 128 mg/dL — ABNORMAL HIGH (ref 65–99)
Glucose-Capillary: 142 mg/dL — ABNORMAL HIGH (ref 65–99)
Glucose-Capillary: 154 mg/dL — ABNORMAL HIGH (ref 65–99)
Glucose-Capillary: 176 mg/dL — ABNORMAL HIGH (ref 65–99)
Glucose-Capillary: 179 mg/dL — ABNORMAL HIGH (ref 65–99)
Glucose-Capillary: 107 mg/dL — ABNORMAL HIGH (ref 65–99)
Glucose-Capillary: 110 mg/dL — ABNORMAL HIGH (ref 65–99)
Glucose-Capillary: 112 mg/dL — ABNORMAL HIGH (ref 65–99)
Glucose-Capillary: 114 mg/dL — ABNORMAL HIGH (ref 65–99)
Glucose-Capillary: 82 mg/dL (ref 65–99)
Glucose-Capillary: 95 mg/dL (ref 65–99)
Glucose-Capillary: 120 mg/dL — ABNORMAL HIGH (ref 65–99)
Glucose-Capillary: 129 mg/dL — ABNORMAL HIGH (ref 65–99)

## 2015-08-17 LAB — BASIC METABOLIC PANEL
Anion gap: 4 — ABNORMAL LOW (ref 5–15)
BUN: 12 mg/dL (ref 6–20)
CO2: 25 mmol/L (ref 22–32)
Calcium: 8.9 mg/dL (ref 8.9–10.3)
Chloride: 110 mmol/L (ref 101–111)
Creatinine, Ser: 0.84 mg/dL (ref 0.44–1.00)
GFR calc Af Amer: >60 mL/min (ref >60)
GFR calc non Af Amer: >60 mL/min (ref >60)
Glucose, Bld: 122 mg/dL — ABNORMAL HIGH (ref 65–99)
Potassium: 3.6 mmol/L (ref 3.5–5.1)
Sodium: 139 mmol/L (ref 135–145)

## 2015-08-17 LAB — POCT I-STAT, CHEM 8
BUN: 11 mg/dL (ref 6–20)
BUN: 17 mg/dL (ref 6–20)
Calcium, Ion: 1 mmol/L — ABNORMAL LOW (ref 1.13–1.30)
Calcium, Ion: 1.3 mmol/L (ref 1.13–1.30)
Chloride: 102 mmol/L (ref 101–111)
Chloride: 112 mmol/L — ABNORMAL HIGH (ref 101–111)
Creatinine, Ser: 0.4 mg/dL — ABNORMAL LOW (ref 0.44–1.00)
Creatinine, Ser: 0.8 mg/dL (ref 0.44–1.00)
Glucose, Bld: 124 mg/dL — ABNORMAL HIGH (ref 65–99)
Glucose, Bld: 88 mg/dL (ref 65–99)
HCT: 21 % — ABNORMAL LOW (ref 36.0–46.0)
HCT: 30 % — ABNORMAL LOW (ref 36.0–46.0)
Hemoglobin: 10.2 g/dL — ABNORMAL LOW (ref 12.0–15.0)
Hemoglobin: 7.1 g/dL — ABNORMAL LOW (ref 12.0–15.0)
Potassium: 3.1 mmol/L — ABNORMAL LOW (ref 3.5–5.1)
Potassium: 4.2 mmol/L (ref 3.5–5.1)
Sodium: 139 mmol/L (ref 135–145)
Sodium: 146 mmol/L — ABNORMAL HIGH (ref 135–145)
TCO2: 16 mmol/L (ref 0–100)
TCO2: 24 mmol/L (ref 0–100)

## 2015-08-17 LAB — CBC
HCT: 27.4 % — ABNORMAL LOW (ref 36.0–46.0)
HCT: 29.8 % — ABNORMAL LOW (ref 36.0–46.0)
Hemoglobin: 9.2 g/dL — ABNORMAL LOW (ref 12.0–15.0)
Hemoglobin: 9.9 g/dL — ABNORMAL LOW (ref 12.0–15.0)
MCH: 29.4 pg (ref 26.0–34.0)
MCH: 29.3 pg (ref 26.0–34.0)
MCHC: 33.6 g/dL (ref 30.0–36.0)
MCHC: 33.2 g/dL (ref 30.0–36.0)
MCV: 87.5 fL (ref 78.0–100.0)
MCV: 88.2 fL (ref 78.0–100.0)
Platelets: 125 10*3/uL — ABNORMAL LOW (ref 150–400)
Platelets: 133 10*3/uL — ABNORMAL LOW (ref 150–400)
RBC: 3.13 MIL/uL — ABNORMAL LOW (ref 3.87–5.11)
RBC: 3.38 MIL/uL — ABNORMAL LOW (ref 3.87–5.11)
RDW: 13.3 % (ref 11.5–15.5)
RDW: 13.8 % (ref 11.5–15.5)
WBC: 11.6 10*3/uL — ABNORMAL HIGH (ref 4.0–10.5)
WBC: 15.3 10*3/uL — ABNORMAL HIGH (ref 4.0–10.5)

## 2015-08-17 LAB — PREPARE FRESH FROZEN PLASMA
Unit division: 0
Unit division: 0

## 2015-08-17 LAB — MAGNESIUM
Magnesium: 2.5 mg/dL — ABNORMAL HIGH (ref 1.7–2.4)
Magnesium: 2.3 mg/dL (ref 1.7–2.4)

## 2015-08-17 LAB — CREATININE, SERUM
Creatinine, Ser: 0.9 mg/dL (ref 0.44–1.00)
GFR calc Af Amer: >60 mL/min (ref >60)
GFR calc non Af Amer: >60 mL/min (ref >60)

## 2015-08-17 MED ORDER — INSULIN DETEMIR 100 UNIT/ML ~~LOC~~ SOLN
10.0000 [IU] | Freq: Two times a day (BID) | SUBCUTANEOUS | Status: DC
Start: 2015-08-17 — End: 2015-08-18
  Administered 2015-08-17: 10 [IU] via SUBCUTANEOUS
  Filled 2015-08-17 (×3): qty 0.1

## 2015-08-17 MED ORDER — FENTANYL CITRATE (PF) 100 MCG/2ML IJ SOLN
12.5000 ug | INTRAMUSCULAR | Status: DC | PRN
  Administered 2015-08-17 – 2015-08-18 (×3): 12.5 ug via INTRAVENOUS
  Filled 2015-08-17 (×3): qty 2

## 2015-08-17 MED ORDER — CETYLPYRIDINIUM CHLORIDE 0.05 % MT LIQD
7.0000 mL | Freq: Two times a day (BID) | OROMUCOSAL | Status: DC
  Administered 2015-08-17 – 2015-08-20 (×5): 7 mL via OROMUCOSAL

## 2015-08-17 MED ORDER — INSULIN ASPART 100 UNIT/ML ~~LOC~~ SOLN
0.0000 [IU] | SUBCUTANEOUS | Status: DC
  Administered 2015-08-17: 2 [IU] via SUBCUTANEOUS

## 2015-08-17 MED ORDER — FUROSEMIDE 10 MG/ML IJ SOLN
20.0000 mg | Freq: Two times a day (BID) | INTRAMUSCULAR | Status: AC
Start: 2015-08-17 — End: 2015-08-18
  Administered 2015-08-17 – 2015-08-18 (×4): 20 mg via INTRAVENOUS
  Filled 2015-08-17 (×3): qty 2

## 2015-08-17 MED ORDER — METOCLOPRAMIDE HCL 5 MG/ML IJ SOLN: 10.0000 mg | Freq: Four times a day (QID) | INTRAMUSCULAR | Status: DC

## 2015-08-17 MED ORDER — FUROSEMIDE 10 MG/ML IJ SOLN: 20.0000 mg | Freq: Two times a day (BID) | INTRAMUSCULAR | Status: DC

## 2015-08-17 MED ORDER — POTASSIUM CHLORIDE 10 MEQ/50ML IV SOLN
10.0000 meq | INTRAVENOUS | Status: AC
  Administered 2015-08-17 (×3): 10 meq via INTRAVENOUS
  Filled 2015-08-17 (×3): qty 50

## 2015-08-17 MED ORDER — POTASSIUM CHLORIDE 10 MEQ/50ML IV SOLN
10.0000 meq | INTRAVENOUS | Status: AC
  Administered 2015-08-17 (×2): 10 meq via INTRAVENOUS

## 2015-08-17 MED ORDER — METOCLOPRAMIDE HCL 5 MG/ML IJ SOLN
10.0000 mg | Freq: Four times a day (QID) | INTRAMUSCULAR | Status: DC
  Administered 2015-08-17 – 2015-08-21 (×13): 10 mg via INTRAVENOUS
  Filled 2015-08-17 (×13): qty 2

## 2015-08-17 MED FILL — Magnesium Sulfate Inj 50%: INTRAMUSCULAR | Qty: 10 | Status: AC

## 2015-08-17 MED FILL — Potassium Chloride Inj 2 mEq/ML: INTRAVENOUS | Qty: 40 | Status: AC

## 2015-08-17 MED FILL — Heparin Sodium (Porcine) Inj 1000 Unit/ML: INTRAMUSCULAR | Qty: 30 | Status: AC

## 2015-08-17 NOTE — Progress Notes (Signed)
CT surgery p.m. Rounds  Patient was up and chair today but too weak to walk Persistent nausea without much incisional pain We'll start IV Reglan in addition to Zofran Hemodynamic stable, atrial pacing  Remove chest tube in a.m.

## 2015-08-17 NOTE — Clinical Documentation Improvement (Signed)
Cardiothoracic  Abnormal Lab/Test Results:   H/H: 5/30: 11.0/33.1. 5/30:  7.8/23.0.  Possible Clinical Conditions associated with below indicators  Acute Blood Loss Anemia  Acute on Chronic Blood Loss Anemia  Other Condition  Cannot Clinically Determine   Supporting Information: EBL: 1570 ml per 5/30 Anesthesia record.  Treatment Provided: Cell saver: 625 ml per 5/30 Anesthesia record. Albumin: 500 ml per 5/30 Anesthesia record.   Please exercise your independent, professional judgment when responding. A specific answer is not anticipated or expected.   Thank You,  [place] [phone]

## 2015-08-17 NOTE — Progress Notes (Signed)
1 Day Post-Op Procedure(s) (LRB): AORTIC VALVE REPLACEMENT (AVR) (N/A) TRANSESOPHAGEAL ECHOCARDIOGRAM (TEE) (N/A) Subjective: Feels weak, not much pain nsr hemodynamics stable  Objective: Vital signs in last 24 hours: Temp:  [96.1 F (35.6 C)-98.4 F (36.9 C)] 97.9 F (36.6 C) (05/31 0715) Pulse Rate:  [62-84] 68 (05/31 0715) Cardiac Rhythm:  [-] Normal sinus rhythm (05/31 0600) Resp:  [6-37] 10 (05/31 0715) BP: (90-136)/(55-79) 105/57 mmHg (05/31 0700) SpO2:  [94 %-100 %] 97 % (05/31 0715) Arterial Line BP: (90-158)/(31-85) 142/48 mmHg (05/31 0715) FiO2 (%):  [40 %-50 %] 40 % (05/30 1948) Weight:  [174 lb 6.1 oz (79.1 kg)] 174 lb 6.1 oz (79.1 kg) (05/31 0451)  Hemodynamic parameters for last 24 hours: PAP: (22-41)/(11-27) 39/16 mmHg CO:  [3 L/min-5.6 L/min] 5.6 L/min CI:  [1.7 L/min/m2-3.3 L/min/m2] 3.3 L/min/m2  Intake/Output from previous day: 05/30 0701 - 05/31 0700 In: 8080.2 [I.V.:4242.7; Blood:1612.5; IV Piggyback:2225] Out: [id number] [Urine:2960; Emesis/NG output:150; Blood:1570; Chest Tube:530] Intake/Output this shift:         Exam    General- alert and comfortable   Lungs- clear without rales, wheezes   Cor- regular rate and rhythm, no murmur , gallop   Abdomen- soft, non-tender   Extremities - warm, non-tender, minimal edema   Neuro- oriented, appropriate, no focal weakness   Lab Results:  Recent Labs  08/16/15 1735 08/16/15 1739 08/17/15 0352  WBC 11.5*  --  11.6*  HGB 11.0* 10.5* 9.2*  HCT 33.1* 31.0* 27.4*  PLT 129*  --  125*   BMET:  Recent Labs  08/16/15 1739 08/17/15 0352  NA 140 139  K 4.4 3.6  CL 106 110  CO2  --  25  GLUCOSE 141* 122*  BUN 13 12  CREATININE 0.80 0.84  CALCIUM  --  8.9    PT/INR:  Recent Labs  08/16/15 1200  LABPROT 19.3*  INR 1.62*   ABG    Component Value Date/Time   PHART 7.283* 08/16/2015 2118   HCO3 22.8 08/16/2015 2118   TCO2 24 08/16/2015 2118   ACIDBASEDEF 4.0* 08/16/2015 2118   O2SAT 96.0  08/16/2015 2118   CBG (last 3)   Recent Labs  08/17/15 0154 08/17/15 0258 08/17/15 0419  GLUCAP 121* 121* 119*    Assessment/Plan: S/P Procedure(s) (LRB): AORTIC VALVE REPLACEMENT (AVR) (N/A) TRANSESOPHAGEAL ECHOCARDIOGRAM (TEE) (N/A) Mobilize Diuresis Diabetes control d/c tubes/lines leave MT in place for now   LOS: 1 day    Heidi Middleton 08/17/2015

## 2015-08-17 NOTE — Op Note (Signed)
Heidi Middleton, Heidi Middleton NO.:  [id number]  MEDICAL RECORD NO.:  [id number]  LOCATION:  2S02C                        FACILITY:  [facility]  PHYSICIAN:  Ivin Poot, M.D.  DATE OF BIRTH:  20-Jun-1945  DATE OF PROCEDURE:  08/16/2015 DATE OF DISCHARGE:                              OPERATIVE REPORT   OPERATION:  Aortic valve replacement with a 21-mm Edwards bovine pericardial valve-serial [id number], model Magna Ease.  PREOPERATIVE DIAGNOSIS:  Severe aortic stenosis.  POSTOPERATIVE DIAGNOSIS:  Severe aortic stenosis.  SURGEON:  Ivin Poot, M.D.  ASSISTANT:  Lars Pinks, PA-C.  ANESTHESIA:  General by Albertha Ghee, MD.  INDICATIONS:  The patient is a 70 year old female who has had progressive symptoms of heart failure including dyspnea on exertion, decreased exercise tolerance, dizziness, and presyncope.  She has a murmur of aortic stenosis.  Serial echocardiograms have followed her aortic stenosis showing a significant gradient on her most recent echo. Calculated valve area was less than 0.9.  She underwent cardiac catheterization, both coronary angiograms, left and right heart cath. This demonstrated normal coronaries with fairly normal right-sided pressures.  She was felt to be a candidate for aortic valve replacement based on her severe aortic stenosis and symptoms.  I examined the patient in the office and reviewed results of the cardiac catheterization and echo with the patient and discussed the expected benefits of aortic valve replacement with a tissue valve for treatment of her severe aortic stenosis.  I discussed the major aspects of surgery including the use of general anesthesia in cardiopulmonary bypass, the expected postoperative recovery, and the location of the surgical incision.  I discussed with her the risks of the surgery including risks of stroke, MI, bleeding, blood transfusion requirement, heart block requiring permanent pacemaker,  infection, postoperative pulmonary problems including pleural effusion, and death.  I also discussed with her the benefits of aortic valve replacement which would be improved symptoms and improved survival.  After reviewing these issues, she demonstrated her understanding and agreed to proceed with surgery under what I felt was an informed consent.  OPERATIVE FINDINGS: 1. The valve was tricuspid, heavily calcified. 2. Successful replacement with a 21-mm Magna Ease pericardial tissue     valve with normal global LV function after separation from     cardiopulmonary bypass.  OPERATIVE PROCEDURE:  The patient was brought to the operating room, placed supine on the operating table where general anesthesia was induced under invasive hemodynamic monitoring.  The chest, abdomen, and legs were prepped with Betadine and draped as a sterile field.  A proper time-out was performed.  A sternal incision was made.  The sternum was divided using the oscillating saw.  The sternal retractor was placed. The pericardium was opened and suspended.  Heparin was administered. Pursestrings were placed in ascending aorta and right atrium.  When the ACT was documented as being therapeutic, the patient was cannulated and placed on cardiopulmonary bypass.  An LV vent was placed via the right superior pulmonary vein.  Cardioplegia cannulas were placed both antegrade and retrograde for cold blood cardioplegia.  The patient was cooled to 32 degrees.  The aortic cross-clamp was applied.  One liter of  cold blood cardioplegia was delivered in split doses between the antegrade aortic and retrograde coronary sinus catheters.  There was good cardioplegic arrest, and septal temperature dropped less than 14 degrees.  Cardioplegia was delivered every 20 minutes with the cross- clamp in place.  A transverse aortotomy was performed.  The aortic valve was inspected. It was tricuspid with severe calcification and thickening of  the leaflets and severe stenosis.  The valve leaflets were excised.  The annulus was debrided of calcium.  The outflow tract was irrigated with copious amounts of saline.  The annulus was sized to a 21-mm valve. Subannular 2-0 Ethibond pledgeted sutures were placed around the annulus numbering 15 total.  The valve was prepared according to protocol.  The sutures were placed through the sewing ring of the valve and the valve was seated and the sutures were carefully tied.  There was no room for space in between the sutures for perivalvular leak.  The coronary ostia were widely patent.  The valve was rinsed one more time.  The aortotomy was then closed in layers using running 4-0 Prolene.  Prior to removing the cross-clamp, air was removed from the coronaries with a dose of retrograde warm blood cardioplegia.  The cross-clamp was removed.  The heart was cardioverted back to a regular rhythm.  The aortotomy was dry.  Temporary pacemaker wires were placed on the right ventricle and right atrium.  The patient was rewarmed and reperfused.  When the patient was adequately reperfused, lungs were expanded, ventilator was resumed and the patient was weaned off cardiopulmonary bypass being AV paced without inotropic support.  Blood pressure and cardiac output were normal.  Echo showed good global LV function with normal functioning aortic valve prosthesis.  There was no AI.  Protamine was administered without adverse reaction.  The cannulas were removed.  The mediastinum was irrigated.  The superior pericardial fat was closed over the aorta.  Anterior mediastinal chest tube was placed and brought out through a separate incision.  The sternum was closed with interrupted wire.  The pectoralis fascia was closed with a running #1 Vicryl.  The subcutaneous and skin layers were closed with running Vicryl and sterile dressings were applied.  Total cardiopulmonary bypass time was 95  minutes.     Ivin Poot, M.D.     PV/MEDQ  D:  08/16/2015  T:  08/17/2015  Job:  [id number]

## 2015-08-17 NOTE — Anesthesia Postprocedure Evaluation (Signed)
Anesthesia Post Note  Patient: Heidi Middleton  Procedure(s) Performed: Procedure(s) (LRB): AORTIC VALVE REPLACEMENT (AVR) (N/A) TRANSESOPHAGEAL ECHOCARDIOGRAM (TEE) (N/A)  Patient location during evaluation: ICU Anesthesia Type: General Level of consciousness: sedated and patient remains intubated per anesthesia plan Pain management: pain level controlled Vital Signs Assessment: post-procedure vital signs reviewed and stable Respiratory status: patient remains intubated per anesthesia plan Cardiovascular status: stable Anesthetic complications: no    Last Vitals:  Filed Vitals:   08/17/15 0800 08/17/15 0900  BP: 115/57 114/60  Pulse: 71 80  Temp:    Resp: 19 10    Last Pain:  Filed Vitals:   08/17/15 0908  PainSc: [place]

## 2015-08-17 NOTE — Progress Notes (Signed)
Pt currently on Nitroglycerin gtt at 90 mcg/min with BP of 152/53 & MAP of 79 per a-line.  Lopressor 5mg  IVP administered at this time per order. HR NSR at 71 bpm. Will continue to monitor.

## 2015-08-18 ENCOUNTER — Encounter (HOSPITAL_COMMUNITY): Payer: Self-pay | Admitting: Cardiothoracic Surgery

## 2015-08-18 ENCOUNTER — Inpatient Hospital Stay (HOSPITAL_COMMUNITY): Payer: Medicare Other

## 2015-08-18 LAB — CBC
HCT: 29.1 % — ABNORMAL LOW (ref 36.0–46.0)
Hemoglobin: 9.5 g/dL — ABNORMAL LOW (ref 12.0–15.0)
MCH: 29.4 pg (ref 26.0–34.0)
MCHC: 32.6 g/dL (ref 30.0–36.0)
MCV: 90.1 fL (ref 78.0–100.0)
Platelets: 126 10*3/uL — ABNORMAL LOW (ref 150–400)
RBC: 3.23 MIL/uL — ABNORMAL LOW (ref 3.87–5.11)
RDW: 13.8 % (ref 11.5–15.5)
WBC: 15.2 10*3/uL — ABNORMAL HIGH (ref 4.0–10.5)

## 2015-08-18 LAB — BASIC METABOLIC PANEL
Anion gap: 6 (ref 5–15)
BUN: 17 mg/dL (ref 6–20)
CO2: 26 mmol/L (ref 22–32)
Calcium: 9.1 mg/dL (ref 8.9–10.3)
Chloride: 105 mmol/L (ref 101–111)
Creatinine, Ser: 0.85 mg/dL (ref 0.44–1.00)
GFR calc Af Amer: >60 mL/min (ref >60)
GFR calc non Af Amer: >60 mL/min (ref >60)
Glucose, Bld: 108 mg/dL — ABNORMAL HIGH (ref 65–99)
Potassium: 4 mmol/L (ref 3.5–5.1)
Sodium: 137 mmol/L (ref 135–145)

## 2015-08-18 LAB — GLUCOSE, CAPILLARY
Glucose-Capillary: 104 mg/dL — ABNORMAL HIGH (ref 65–99)
Glucose-Capillary: 108 mg/dL — ABNORMAL HIGH (ref 65–99)
Glucose-Capillary: 109 mg/dL — ABNORMAL HIGH (ref 65–99)
Glucose-Capillary: 111 mg/dL — ABNORMAL HIGH (ref 65–99)
Glucose-Capillary: 118 mg/dL — ABNORMAL HIGH (ref 65–99)
Glucose-Capillary: 121 mg/dL — ABNORMAL HIGH (ref 65–99)

## 2015-08-18 MED ORDER — POTASSIUM CHLORIDE 10 MEQ/100ML IV SOLN
10.0000 meq | Freq: Once | INTRAVENOUS | Status: AC
  Administered 2015-08-18: 10 meq via INTRAVENOUS
  Filled 2015-08-18: qty 100

## 2015-08-18 MED ORDER — MAGNESIUM HYDROXIDE 400 MG/5ML PO SUSP: 30.0000 mL | Freq: Every day | ORAL | Status: DC | PRN

## 2015-08-18 MED ORDER — SODIUM CHLORIDE 0.9% FLUSH
3.0000 mL | Freq: Two times a day (BID) | INTRAVENOUS | Status: DC
  Administered 2015-08-18 – 2015-08-20 (×5): 3 mL via INTRAVENOUS

## 2015-08-18 MED ORDER — POTASSIUM CHLORIDE CRYS ER 10 MEQ PO TBCR: 10.0000 meq | EXTENDED_RELEASE_TABLET | Freq: Every day | ORAL | Status: DC

## 2015-08-18 MED ORDER — SODIUM CHLORIDE 0.9 % IV SOLN: 250.0000 mL | INTRAVENOUS | Status: DC | PRN

## 2015-08-18 MED ORDER — FUROSEMIDE 40 MG PO TABS
40.0000 mg | ORAL_TABLET | Freq: Every day | ORAL | Status: DC
  Administered 2015-08-19 – 2015-08-21 (×3): 40 mg via ORAL
  Filled 2015-08-18 (×3): qty 1

## 2015-08-18 MED ORDER — INSULIN ASPART 100 UNIT/ML ~~LOC~~ SOLN
0.0000 [IU] | Freq: Three times a day (TID) | SUBCUTANEOUS | Status: DC
  Administered 2015-08-18 – 2015-08-19 (×2): 2 [IU] via SUBCUTANEOUS

## 2015-08-18 MED ORDER — SODIUM CHLORIDE 0.9% FLUSH: 3.0000 mL | INTRAVENOUS | Status: DC | PRN

## 2015-08-18 MED ORDER — MOVING RIGHT ALONG BOOK
Freq: Once | Status: AC
  Administered 2015-08-18: 08:00:00
  Filled 2015-08-18: qty 1

## 2015-08-18 NOTE — Progress Notes (Signed)
CARDIAC REHAB PHASE I   PRE:  Rate/Rhythm: 80 paced    BP: sitting 130/56    SaO2: 89-90 RA  MODE:  Ambulation: 350 ft   POST:  Rate/Rhythm: 81 pacing    BP: sitting 123/58     SaO2: 89 RA  Pt eager to walk and got out of bed independently with appropriate mechanics. Used RW with steady gait. Pt DOE, SaO2 borderline at 89-90 RA during and after walk. Did not want O2. To recliner. Notified RN of SAO2. Encouraged more IS use. Family present. [place], ACSM 08/18/2015 12:21 PM

## 2015-08-18 NOTE — Progress Notes (Signed)
Right IJ introducer removed. Unable to completely remove one suture from neck area due to slight skin swelling around suture. 2W nurse notified of need to remove suture when dressing comes off. Report called to 2W nurse. Patient will be transported via wheelchair to 2W25 once bedrest is completed.  Roselyn Reef Shubham Thackston,RN

## 2015-08-18 NOTE — Progress Notes (Signed)
Patient arrived on the unit from 2S, assessment completed see flowsheet, placed on tele, ccmd notified, patient oriented to staff and room, call light within reach, bed in lowest position, will continue to monitor.

## 2015-08-18 NOTE — Discharge Summary (Signed)
Physician Discharge Summary       [address]       [place] [address]             [phone]    Patient ID: Heidi Middleton MRN: [id number] DOB/AGE: [birth year] 70 y.o.  Admit date: 08/16/2015 Discharge date: 08/21/2015  Admission Diagnosis: Severe aortic stenosis  Active Diagnoses:  1. Hypertension 2. History of tobacco abuse 3. Colonic polyp 4. ABL anemia  Procedure (s):  Aortic valve replacement with a 21-mm Edwards bovine pericardial valve-serial [id number], model Magna Ease by Dr. [name] on 08/16/2015.  History of Presenting Illness: This is a 70 year-old Caucasian female reformed smoker with recent diagnosis of moderate to severe aortic stenosis. She was noted to have a cardiac murmur by her primary care physician. She was also at that time noted to have decreasing exercise tolerance of her usual aerobic and yoga She used to be able to work out for 40 minutes but  now she needs to stop after 5-10 minutes. She is also noted increasing shortness of breath when she walks backup the driveway after taking her grandchildren to the school bus. She denies any symptoms at rest. She denies edema weight loss or weight gain. She denies history of rheumatic or scarlet fever as a girl. She denies cardiac problems with the pregnancies and deliveries in the past.  2-D echocardiogram showed heavily calcified aortic valve with peak gradient of 60 mm mercury and mean gradient of 35 mm mercury. Calculated valve area 0.9-1.0. She had no aortic root dilatation, no AI and no MR. PA pressures were fairly normal by echo.  Cardiac catheterization demonstrated no significant coronary disease.the mean gradient was elevated at 55 mmHg with a valve area calculated at 0.7. Right heart cath data were within normal limits.  Patient presents today to discuss aortic valve replacement with a biologic valve. The patient has no active dental complaints and recently had her routine six-month  dental hygiene.  The patient wishes to undergo aVR to correct the symptomatic exercise decline she is experiencing and understands this will improve her quality of life and allow her to return for a regular exercise schedule which she enjoys. Dr. [name] discussed potential risks, complications, and benefits of the surgery. Pre operative carotid duplex showed no significant internal carotid artery stenosis bilaterally. She wished to proceed and was admitted on 08/16/2015 in order to undergo an AVR.  Brief Hospital Course:  The patient was extubated late the evening of surgery without difficulty. She remained afebrile and hemodynamically stable. She was inially atrial paced. Gordy Councilman, a line, and foley were removed early in the post operative course. Chest tubes remained in for a few days and then were removed. Lopressor was started and titrated accordingly. He/she was volume over loaded and diuresed. She had ABL anemia. She did not require a post op transfusion. Her last H and H was 9.4 . She was weaned off the insulin drip. . The patient's HGA1C pre op was 5.7. 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 She continues to progress with cardiac rehab. She was ambulating on room air. She has been tolerating a diet and has had a bowel movement. Epicardial pacing wires will be removed on 06/02. Chest tube sutures will be removed prior to discharge. The patient is felt surgically stable for  discharge today.   Latest Vital Signs: Blood pressure 155/58, pulse 82, temperature 98.5 F (36.9 C), temperature source Oral, resp. rate 18, height 5\' 8"  (1.727 m), weight 172 lb 3.2 oz (78.109 kg), SpO2 96 %.  Physical Exam: General- alert and comfortable  Lungs- clear without rales, wheezes  Cor- regular rate and rhythm, no murmur , gallop   Abdomen- soft, non-tender  Extremities - warm, non-tender, minimal edema  Neuro- oriented, appropriate, no focal weakness  Discharge Condition:Stable and discharged to home.  Recent laboratory studies:  Lab Results  Component Value Date   WBC 11.8* 08/19/2015   HGB 9.4* 08/19/2015   HCT 28.8* 08/19/2015   MCV 90.3 08/19/2015   PLT 129* 08/19/2015   Lab Results  Component Value Date   NA 132* 08/19/2015   K 3.7 08/19/2015   CL 99* 08/19/2015   CO2 25 08/19/2015   CREATININE 0.91 08/19/2015   GLUCOSE 106* 08/19/2015    Diagnostic Studies:  Dg Chest Port 1 View  08/18/2015  CLINICAL DATA:  Aortic valve replacement. EXAM: PORTABLE CHEST 1 VIEW COMPARISON:  08/17/2015. FINDINGS: Interval removal Swan-Ganz catheter and mediastinal drainage catheterization. Right IJ sheath in stable position. Prior aortic valve replacement. Heart size stable. Low lung volumes with mild bibasilar atelectasis and/or infiltrates. Small left pleural effusion. No pneumothorax noted on today's exam. IMPRESSION: 1. Interim removal Swan-Ganz catheter and mediastinal drainage catheter. Right IJ sheath in stable position. No pneumothorax noted on today's exam. 2. Prior aortic valve replacement.  Heart size stable. 3. Persistent low lung volumes with mild bibasilar atelectasis and/or infiltrates. Small left pleural effusion. No interim change. Electronically Signed   By: Marcello Moores  Register   On: 08/18/2015 07:42      Discharge Instructions    Amb Referral to Cardiac Rehabilitation    Complete by:  As directed   Diagnosis:  Valve Replacement  Valve:  Aortic          Discharge Medications:   Medication List    STOP taking these medications        hydrochlorothiazide 25 MG tablet  Commonly known as:  HYDRODIURIL      TAKE these medications        acetaminophen 500 MG tablet  Commonly known as:  TYLENOL  Take 2 tablets (1,000 mg total) by mouth every 6 (six) hours as needed for mild pain or fever.      aspirin 325 MG EC tablet  Take 1 tablet (325 mg total) by mouth daily.     atorvastatin 10 MG tablet  Commonly known as:  LIPITOR  TAKE 0.5 TABLETS (5 MG TOTAL) BY MOUTH DAILY.     Biotin 5000 MCG Caps  Take 5,000 mcg by mouth 2 (two) times daily.     calcium-vitamin D 500-200 MG-UNIT tablet  1200mg  by mouth daily     cholecalciferol 1000 units tablet  Commonly known as:  VITAMIN D  Take 1,000 Units by mouth 2 (two) times daily.     CoQ-10 100 MG Caps  Take 100 mg by mouth daily.     esomeprazole 40 MG capsule  Commonly known as:  NEXIUM  TAKE 1 CAPSULE (40 MG TOTAL) BY MOUTH DAILY BEFORE BREAKFAST.     Fish Oil 1000 MG Caps  Take 1 capsule by mouth 2 (two) times daily.     furosemide 40 MG tablet  Commonly known as:  LASIX  Take 1 tablet (40 mg total) by mouth daily. For 7 Days  GLUCOSAMINE CHONDR 1500 COMPLX PO  Take 1 tablet by mouth daily.     lisinopril 10 MG tablet  Commonly known as:  PRINIVIL,ZESTRIL  Take 1 tablet (10 mg total) by mouth daily.     metoprolol tartrate 25 MG tablet  Commonly known as:  LOPRESSOR  Take 0.5 tablets (12.5 mg total) by mouth 2 (two) times daily.     multivitamin tablet  Take 1 tablet by mouth daily.     multivitamin with minerals Tabs tablet  Take 1 tablet by mouth daily.     potassium chloride SA 20 MEQ tablet  Commonly known as:  K-DUR,KLOR-CON  Take 1 tablet (20 mEq total) by mouth daily. For 7 Days     traMADol 50 MG tablet  Commonly known as:  ULTRAM  Take 1-2 tablets (50-100 mg total) by mouth every 4 (four) hours as needed for moderate pain.      The patient has been discharged on:   1.Beta Blocker:  Yes [ x  ]                              No   [   ]                              If No, reason:  2.Ace Inhibitor/ARB: Yes [ x  ]                                     No  [    ]                                     If No, reason:  3.Statin:   Yes [  x ]                  No  [   ]                  If No,  reason:  4.Ecasa:  Yes  [ x  ]                  No   [   ]                  If No, reason:  Follow Up Appointments: Follow-up Information    Follow up with Wende Bushy, MD On 09/01/2015.   Specialty:  Cardiology   Why:  Appointment time is at 2:45 pm   Contact information:   [place] [address] [phone]       Follow up with Ivin Poot III, MD On 09/14/2015.   Specialty:  Cardiothoracic Surgery   Why:  PA/LAT CXR to be taken (at [place] which is in the same building as Dr. Lucianne Lei Trigt's office) on 09/14/2015 at 1:15 pm;Appointment time is at 2:00 pm   Contact information:   [place] [address] [phone]       Signed: Cinda Quest 08/21/2015, 8:36 AM

## 2015-08-18 NOTE — Progress Notes (Signed)
2 Days Post-Op Procedure(s) (LRB): AORTIC VALVE REPLACEMENT (AVR) (N/A) TRANSESOPHAGEAL ECHOCARDIOGRAM (TEE) (N/A) Subjective: Nausea resolved- now hungry A-paced 8o for sinus brady CXR clear preop anemia and postop expected blood loss anemia Objective: Vital signs in last 24 hours: Temp:  [97.7 F (36.5 C)-98.5 F (36.9 C)] 97.9 F (36.6 C) (06/01 0721) Pulse Rate:  [71-80] 80 (06/01 0700) Cardiac Rhythm:  [-] Atrial paced (06/01 0600) Resp:  [8-23] 8 (06/01 0700) BP: (111-144)/(53-103) 144/71 mmHg (06/01 0600) SpO2:  [89 %-100 %] 96 % (06/01 0700) Arterial Line BP: (149-161)/(45-59) 161/45 mmHg (05/31 0900) Weight:  [175 lb 7.8 oz (79.6 kg)] 175 lb 7.8 oz (79.6 kg) (06/01 0427)  Hemodynamic parameters for last 24 hours:  stable  Intake/Output from previous day: 05/31 0701 - 06/01 0700 In: 837 [P.O.:120; I.V.:517; IV Piggyback:200] Out: 1885 [id number]; Chest Tube:170] Intake/Output this shift:         Exam    General- alert and comfortable   Lungs- clear without rales, wheezes   Cor- regular rate and rhythm, no murmur , gallop   Abdomen- soft, non-tender   Extremities - warm, non-tender, minimal edema   Neuro- oriented, appropriate, no focal weakness   Lab Results:  Recent Labs  08/17/15 1625 08/17/15 1628 08/18/15 0400  WBC 15.3*  --  15.2*  HGB 9.9* 10.2* 9.5*  HCT 29.8* 30.0* 29.1*  PLT 133*  --  126*   BMET:  Recent Labs  08/17/15 0352  08/17/15 1628 08/18/15 0400  NA 139  < > 139 137  K 3.6  < > 4.2 4.0  CL 110  < > 102 105  CO2 25  --   --  26  GLUCOSE 122*  < > 124* 108*  BUN 12  < > 17 17  CREATININE 0.84  < > 0.80 0.85  CALCIUM 8.9  --   --  9.1  < > = values in this interval not displayed.  PT/INR:  Recent Labs  08/16/15 1200  LABPROT 19.3*  INR 1.62*   ABG    Component Value Date/Time   PHART 7.283* 08/16/2015 2118   HCO3 22.8 08/16/2015 2118   TCO2 24 08/17/2015 1628   ACIDBASEDEF 4.0* 08/16/2015 2118   O2SAT 96.0  08/16/2015 2118   CBG (last 3)   Recent Labs  08/17/15 1918 08/17/15 2340 08/18/15 0330  GLUCAP 129* 118* 109*    Assessment/Plan: S/P Procedure(s) (LRB): AORTIC VALVE REPLACEMENT (AVR) (N/A) TRANSESOPHAGEAL ECHOCARDIOGRAM (TEE) (N/A) tx to stepdown diuresis   LOS: 2 days    Tharon Aquas Trigt III 08/18/2015

## 2015-08-18 NOTE — Care Management Note (Signed)
Case Management Note  Patient Details  Name: Heidi Middleton MRN: [id number] Date of Birth: Feb 13, 1946  Subjective/Objective:    Pt lives with spouse and, per sister, is his caregiver.  Sister states she plans to stay with pt for at least a week to provide assistance to both pt and her spouse.                         Expected Discharge Plan:  Home/Self Care  Discharge planning Services  CM Consult  Status of Service:  In process, will continue to follow  Girard Cooter, RN 08/18/2015, 1:59 PM

## 2015-08-18 NOTE — Plan of Care (Signed)
Problem: Activity: Goal: Risk for activity intolerance will decrease Outcome: Progressing Pt. Ambulated 200 feet in hallway post CT removal with little assistance - tolerated well. No complications.

## 2015-08-18 NOTE — Discharge Instructions (Signed)
Aortic Valve Replacement, Care After °Refer to this sheet in the next few weeks. These instructions provide you with information on caring for yourself after your procedure. Your health care provider may also give you specific instructions. Your treatment has been planned according to current medical practices, but problems sometimes occur. Call your health care provider if you have any problems or questions after your procedure. °HOME CARE INSTRUCTIONS  °· Take medicines only as directed by your health care provider. °· If your health care provider has prescribed elastic stockings, wear them as directed. °· Take frequent naps or rest often throughout the day. °· Avoid lifting over 10 lbs (4.5 kg) or pushing or pulling things with your arms for 6-8 weeks or as directed by your health care provider. °· Avoid driving or airplane travel for 4-6 weeks after surgery or as directed by your health care provider. If you are riding in a car for an extended period, stop every 1-2 hours to stretch your legs. Keep a record of your medicines and medical history with you when traveling. °· Do not drive or operate heavy machinery while taking pain medicine. (narcotics). °· Do not cross your legs. °· Do not use any tobacco products including cigarettes, chewing tobacco, or electronic cigarettes. If you need help quitting, ask your health care provider. °· Do not take baths, swim, or use a hot tub until your health care provider approves. Take showers once your health care provider approves. Pat incisions dry. Do not rub incisions with a washcloth or towel. °· Avoid climbing stairs and using the handrail to pull yourself up for the first 2-3 weeks after surgery. °· Return to work as directed by your health care provider. °· Drink enough fluid to keep your urine clear or pale yellow. °· Do not strain to have a bowel movement. Eat high-fiber foods if you become constipated. You may also take a medicine to help you have a bowel  movement (laxative) as directed by your health care provider. °· Resume sexual activity as directed by your health care provider. Men should not use medicines for erectile dysfunction until their doctor says it is okay. °· If you had a certain type of heart condition in the past, you may need to take antibiotic medicine before having dental work or surgery. Let your dentist and health care providers know if you had one or more of the following: °¨ Previous endocarditis. °¨ An artificial (prosthetic) heart valve. °¨ Congenital heart disease. °SEEK MEDICAL CARE IF: °· You develop a skin rash.   °· You experience sudden changes in your weight. °· You have a fever. °SEEK IMMEDIATE MEDICAL CARE IF:  °· You develop chest pain that is not coming from your incision. °· You have drainage (pus), redness, swelling, or pain at your incision site.   °· You develop shortness of breath or have difficulty breathing.   °· You have increased bleeding from your incision site.   °· You develop light-headedness.   °MAKE SURE YOU:  °· Understand these directions. °· Will watch your condition. °· Will get help right away if you are not doing well or get worse. °  °This information is not intended to replace advice given to you by your health care provider. Make sure you discuss any questions you have with your health care provider. °  °Document Released: 09/21/2004 Document Revised: 03/26/2014 Document Reviewed: 12/18/2011 °Elsevier Interactive Patient Education ©2016 Elsevier Inc. ° °

## 2015-08-19 ENCOUNTER — Inpatient Hospital Stay (HOSPITAL_COMMUNITY): Payer: Medicare Other

## 2015-08-19 LAB — BASIC METABOLIC PANEL
Anion gap: 8 (ref 5–15)
BUN: 16 mg/dL (ref 6–20)
CO2: 25 mmol/L (ref 22–32)
Calcium: 8.9 mg/dL (ref 8.9–10.3)
Chloride: 99 mmol/L — ABNORMAL LOW (ref 101–111)
Creatinine, Ser: 0.91 mg/dL (ref 0.44–1.00)
GFR calc Af Amer: >60 mL/min (ref >60)
GFR calc non Af Amer: >60 mL/min (ref >60)
Glucose, Bld: 106 mg/dL — ABNORMAL HIGH (ref 65–99)
Potassium: 3.7 mmol/L (ref 3.5–5.1)
Sodium: 132 mmol/L — ABNORMAL LOW (ref 135–145)

## 2015-08-19 LAB — CBC
HCT: 28.8 % — ABNORMAL LOW (ref 36.0–46.0)
Hemoglobin: 9.4 g/dL — ABNORMAL LOW (ref 12.0–15.0)
MCH: 29.5 pg (ref 26.0–34.0)
MCHC: 32.6 g/dL (ref 30.0–36.0)
MCV: 90.3 fL (ref 78.0–100.0)
Platelets: 129 10*3/uL — ABNORMAL LOW (ref 150–400)
RBC: 3.19 MIL/uL — ABNORMAL LOW (ref 3.87–5.11)
RDW: 13.7 % (ref 11.5–15.5)
WBC: 11.8 10*3/uL — ABNORMAL HIGH (ref 4.0–10.5)

## 2015-08-19 LAB — GLUCOSE, CAPILLARY: Glucose-Capillary: 126 mg/dL — ABNORMAL HIGH (ref 65–99)

## 2015-08-19 MED ORDER — POTASSIUM CHLORIDE CRYS ER 20 MEQ PO TBCR
20.0000 meq | EXTENDED_RELEASE_TABLET | Freq: Every day | ORAL | Status: DC
  Administered 2015-08-19 – 2015-08-21 (×3): 20 meq via ORAL
  Filled 2015-08-19 (×3): qty 1

## 2015-08-19 MED ORDER — POTASSIUM CHLORIDE CRYS ER 20 MEQ PO TBCR
30.0000 meq | EXTENDED_RELEASE_TABLET | Freq: Once | ORAL | Status: AC
  Administered 2015-08-19: 30 meq via ORAL
  Filled 2015-08-19: qty 1

## 2015-08-19 NOTE — Care Management Important Message (Signed)
Important Message  Patient Details  Name: Heidi Middleton MRN: [id number] Date of Birth: 01/01/1946   Medicare Important Message Given:  Yes    Loann Quill 08/19/2015, 9:28 AM

## 2015-08-19 NOTE — Evaluation (Signed)
Physical Therapy Evaluation Patient Details Name: Heidi Middleton MRN: [id number] DOB: 1945-09-14 Today's Date: 08/19/2015   History of Present Illness  Pt s/p AVR for aortic stenosis. PMH - HTN  Clinical Impression  Pt doing well with mobility and no further PT needed.        Follow Up Recommendations No PT follow up    Equipment Recommendations  None recommended by PT    Recommendations for Other Services       Precautions / Restrictions Precautions Precautions: Sternal      Mobility  Bed Mobility Overal bed mobility: Modified Independent             General bed mobility comments: Good adherence to sternal precautions  Transfers Overall transfer level: Modified independent Equipment used: None             General transfer comment: Able to perform with no use of hands  Ambulation/Gait Ambulation/Gait assistance: Modified independent (Device/Increase time) Ambulation Distance (Feet): 600 Feet Assistive device: Rolling walker (2 wheeled);None Gait Pattern/deviations: Step-through pattern Gait velocity: decr relative to her usual pace   General Gait Details: Steady gait with or without walker  Stairs Stairs: Yes Stairs assistance: Modified independent (Device/Increase time) Stair Management: One rail Right;Forwards;Alternating pattern;Step to pattern Number of Stairs: 5 General stair comments: Steady on stairs  Wheelchair Mobility    Modified Rankin (Stroke Patients Only)       Balance Overall balance assessment: Modified Independent                                           Pertinent Vitals/Pain Pain Assessment: Faces Faces Pain Scale: Hurts little more Pain Location: rt upper back Pain Descriptors / Indicators: Stabbing Pain Intervention(s): Limited activity within patient's tolerance;Monitored during session    Home Living Family/patient expects to be discharged to:: Private residence Living Arrangements:  Spouse/significant other Available Help at Discharge:  (Son and grandson have been providing some assist with husban) Type of Home: House Home Access: Stairs to enter Entrance Stairs-Rails: Right Entrance Stairs-Number of Steps: 3 Home Layout: One level Home Equipment: None      Prior Function Level of Independence: Independent         Comments: Very active and to the gym 4x/wk     Hand Dominance        Extremity/Trunk Assessment   Upper Extremity Assessment: Overall WFL for tasks assessed           Lower Extremity Assessment: Overall WFL for tasks assessed         Communication   Communication: No difficulties  Cognition Arousal/Alertness: Awake/alert Behavior During Therapy: WFL for tasks assessed/performed Overall Cognitive Status: Within Functional Limits for tasks assessed                      General Comments      Exercises        Assessment/Plan    PT Assessment Patent does not need any further PT services  PT Diagnosis Difficulty walking   PT Problem List    PT Treatment Interventions     PT Goals (Current goals can be found in the Care Plan section) Acute Rehab PT Goals PT Goal Formulation: All assessment and education complete, DC therapy    Frequency     Barriers to discharge        Co-evaluation  End of Session   Activity Tolerance: Patient tolerated treatment well Patient left: in bed;with call bell/phone within reach;with nursing/sitter in room Nurse Communication: Mobility status         Time: [id number] PT Time Calculation (min) (ACUTE ONLY): 14 min   Charges:   PT Evaluation $PT Eval Low Complexity: 1 Procedure     PT G Codes:        Joeli Fenner 2015/09/11, 4:27 PM  [facility] PT [phone]

## 2015-08-19 NOTE — Progress Notes (Signed)
When walking patients HR jumped up to the 130's.

## 2015-08-19 NOTE — Progress Notes (Addendum)
      [address]       [place] [address]             [phone]        3 Days Post-Op Procedure(s) (LRB): AORTIC VALVE REPLACEMENT (AVR) (N/A) TRANSESOPHAGEAL ECHOCARDIOGRAM (TEE) (N/A)  Subjective: Patient tired. She is passing flatus but no bowel movement yet.  Objective: Vital signs in last 24 hours: Temp:  [97.5 F (36.4 C)-98.9 F (37.2 C)] 98.1 F (36.7 C) (06/02 0503) Pulse Rate:  [79-96] 96 (06/02 0503) Cardiac Rhythm:  [-] Normal sinus rhythm (06/01 1900) Resp:  [17-19] 19 (06/02 0503) BP: (117-140)/(45-75) 118/45 mmHg (06/02 0503) SpO2:  [91 %-98 %] 94 % (06/02 0503) Weight:  [175 lb 14.4 oz (79.788 kg)] 175 lb 14.4 oz (79.788 kg) (06/02 0217)  Pre op weight 75 kg Current Weight  08/19/15 175 lb 14.4 oz (79.788 kg)      Intake/Output from previous day: 06/01 0701 - 06/02 0700 In: 630 [P.O.:480; IV Piggyback:150] Out: 2000 [Urine:2000]   Physical Exam:  Cardiovascular: RRR, no murmur Pulmonary: Slightly diminished at bases Abdomen: Soft, non tender, bowel sounds present. Extremities: Mild bilateral lower extremity edema. Wounds: Aquacel dressing removed. Wound is clean and dry.  No erythema or signs of infection.  Lab Results: CBC: Recent Labs  08/18/15 0400 08/19/15 0244  WBC 15.2* 11.8*  HGB 9.5* 9.4*  HCT 29.1* 28.8*  PLT 126* 129*   BMET:  Recent Labs  08/18/15 0400 08/19/15 0244  NA 137 132*  K 4.0 3.7  CL 105 99*  CO2 26 25  GLUCOSE 108* 106*  BUN 17 16  CREATININE 0.85 0.91  CALCIUM 9.1 8.9    PT/INR:  Lab Results  Component Value Date   INR 1.62* 08/16/2015   INR 1.02 08/11/2015   INR 1.0 07/28/2015   ABG:  INR: Will add last result for INR, ABG once components are confirmed Will add last 4 CBG results once components are confirmed  Assessment/Plan:  1. CV - SR in the 90's this am. On back up pacer at 80 and is NOT being paced this am. Disconnect external pacer.On Lopressor 12.5 mg bid 2.   Pulmonary - On room air. CXR this am appears to show small bilateral pleural effusions and atelectasis, no pneumothorax. Encourage incentive spirometer. 3. Volume Overload - On Lasix 40 mg daily 4.  Acute blood loss anemia - H and H stable at 9.4 and 28.8 5. Mild thrombocytopenia-platelets up to 129,000 6. Supplement potassium 7. Remove EPW in am 8. CBGs 104/108/126. Pre op HGA1C 5.7. Likely pre diabetic. Stop accu checks and SS PRN 9. Hopefully, home this weekend  ZIMMERMAN,DONIELLE MPA-C 08/19/2015,7:43 AM  Patient doing well maintaining sinus rhythm after aVR for severe AS  Discontinue temporary pacemaker and observe for 24 hours before removing wires Plan discharge home Sunday

## 2015-08-19 NOTE — Progress Notes (Signed)
CARDIAC REHAB PHASE I   PRE:  Rate/Rhythm: 88 SR  BP:  Supine: 133/59  Sitting:   Standing:    SaO2: 96%RA  MODE:  Ambulation: 550 ft   POST:  Rate/Rhythm: 104 ST  BP:  Supine:   Sitting: 126/51  Standing:    SaO2: 89-90%RA 1010-1052 Pt walked 550 ft on RA with rolling walker and minimal asst. Gait steady. Stopped a couple of times to rest. Pt stated that she would like to talk to someone about Rehab after discharge as she takes care of her husband. She stated everyone works and there is no one to help her at home so she can rest. Told case manager so she can follow up with pt. Sats borderline after walk. Encouraged IS.    Graylon Good, RN BSN  08/19/2015 10:49 AM

## 2015-08-20 LAB — TYPE AND SCREEN
ABO/RH(D): O POS
Antibody Screen: NEGATIVE
Unit division: 0
Unit division: 0
Unit division: 0
Unit division: 0
Unit division: 0
Unit division: 0

## 2015-08-20 LAB — GLUCOSE, CAPILLARY: Glucose-Capillary: 112 mg/dL — ABNORMAL HIGH (ref 65–99)

## 2015-08-20 MED ORDER — LISINOPRIL 5 MG PO TABS
5.0000 mg | ORAL_TABLET | Freq: Every day | ORAL | Status: DC
  Administered 2015-08-20: 5 mg via ORAL
  Filled 2015-08-20: qty 1

## 2015-08-20 MED ORDER — LACTULOSE 10 GM/15ML PO SOLN: 20.0000 g | Freq: Every day | ORAL | Status: DC | PRN

## 2015-08-20 NOTE — Progress Notes (Signed)
CARDIAC REHAB PHASE I   Patient has been ambulating in hallway on own. Patient education completed. Interested in attending Cardiac Rehab at [facility]. Order placed. Patient left in chair with call bell in reach.  [place], MS 08/20/2015 1:27 PM

## 2015-08-20 NOTE — Progress Notes (Addendum)
      [address]       [place] [address]             [phone]      4 Days Post-Op Procedure(s) (LRB): AORTIC VALVE REPLACEMENT (AVR) (N/A) TRANSESOPHAGEAL ECHOCARDIOGRAM (TEE) (N/A)   Subjective:  Ms. Heidi Middleton has no complaints.  She states she is doing well.  Has no pain.  + ambulation,  Tiny BM  Objective: Vital signs in last 24 hours: Temp:  [98.2 F (36.8 C)-98.6 F (37 C)] 98.4 F (36.9 C) (06/03 0322) Pulse Rate:  [73-86] 81 (06/03 0322) Cardiac Rhythm:  [-] Normal sinus rhythm (06/03 0718) Resp:  [17-20] 17 (06/03 0322) BP: (126-153)/(56-73) 153/63 mmHg (06/03 0322) SpO2:  [88 %-91 %] 91 % (06/03 0322) Weight:  [175 lb 6.4 oz (79.561 kg)] 175 lb 6.4 oz (79.561 kg) (06/03 0322)  Intake/Output from previous day: 06/02 0701 - 06/03 0700 In: 720 [P.O.:720] Out: 500 [Urine:500]  General appearance: alert, cooperative and no distress Heart: regular rate and rhythm Lungs: clear to auscultation bilaterally Abdomen: soft, non-tender; bowel sounds normal; no masses,  no organomegaly Extremities: edema trace Wound: clean and dry  Lab Results:  Recent Labs  08/18/15 0400 08/19/15 0244  WBC 15.2* 11.8*  HGB 9.5* 9.4*  HCT 29.1* 28.8*  PLT 126* 129*   BMET:  Recent Labs  08/18/15 0400 08/19/15 0244  NA 137 132*  K 4.0 3.7  CL 105 99*  CO2 26 25  GLUCOSE 108* 106*  BUN 17 16  CREATININE 0.85 0.91  CALCIUM 9.1 8.9    PT/INR: No results for input(s): LABPROT, INR in the last 72 hours. ABG    Component Value Date/Time   PHART 7.283* 08/16/2015 2118   HCO3 22.8 08/16/2015 2118   TCO2 24 08/17/2015 1628   ACIDBASEDEF 4.0* 08/16/2015 2118   O2SAT 96.0 08/16/2015 2118   CBG (last 3)   Recent Labs  08/18/15 1705 08/18/15 2028 08/19/15 0603  GLUCAP 104* 108* 126*    Assessment/Plan: S/P Procedure(s) (LRB): AORTIC VALVE REPLACEMENT (AVR) (N/A) TRANSESOPHAGEAL ECHOCARDIOGRAM (TEE) (N/A)  1. CV- NSR, + HTN- continue  Lopressor, add low dose ACE... Will d/c EPW 2. Pulm- no acute issues, minimal effusion bilaterally, continue IS 3. Renal- creatinine WNL, mild hypervolemia, continue Lasix 4. GI- tiny BM per patient, does not feel this was enough, Lactulose add prn 5. Dispo- patient stable, add Lisinopril for HTN, d/c EPW,,, if remains stable, plan for d/c in AM   LOS: 4 days    BARRETT, ERIN 08/20/2015   I have seen and examined the patient and agree with the assessment and plan as outlined.  Rexene Alberts, MD 08/20/2015 12:15 PM

## 2015-08-20 NOTE — Progress Notes (Signed)
Patient sitting on side of bed, no needs at this time. Call light within reach.

## 2015-08-20 NOTE — Progress Notes (Signed)
Pacer wires have been removed. They were intact. Patient instructed to remain on bedrest for one hour. Q15 vitals are being done. Will continue to monitor.

## 2015-08-21 LAB — GLUCOSE, CAPILLARY: Glucose-Capillary: 104 mg/dL — ABNORMAL HIGH (ref 65–99)

## 2015-08-21 MED ORDER — LISINOPRIL 10 MG PO TABS
10.0000 mg | ORAL_TABLET | Freq: Every day | ORAL | Status: DC
Start: 2015-08-21 — End: 2015-08-21
  Administered 2015-08-21: 10 mg via ORAL
  Filled 2015-08-21: qty 1

## 2015-08-21 MED ORDER — ASPIRIN 325 MG PO TBEC: 325.0000 mg | DELAYED_RELEASE_TABLET | Freq: Every day | ORAL | Status: DC

## 2015-08-21 MED ORDER — FUROSEMIDE 40 MG PO TABS: 40.0000 mg | ORAL_TABLET | Freq: Every day | ORAL | Status: DC

## 2015-08-21 MED ORDER — TRAMADOL HCL 50 MG PO TABS: 50.0000 mg | ORAL_TABLET | ORAL | Status: DC | PRN

## 2015-08-21 MED ORDER — POTASSIUM CHLORIDE CRYS ER 20 MEQ PO TBCR: 20.0000 meq | EXTENDED_RELEASE_TABLET | Freq: Every day | ORAL | Status: DC

## 2015-08-21 MED ORDER — METOPROLOL TARTRATE 25 MG PO TABS: 12.5000 mg | ORAL_TABLET | Freq: Two times a day (BID) | ORAL | Status: DC

## 2015-08-21 MED ORDER — LISINOPRIL 10 MG PO TABS: 10.0000 mg | ORAL_TABLET | Freq: Every day | ORAL | Status: DC

## 2015-08-21 MED ORDER — ACETAMINOPHEN 500 MG PO TABS: 1000.0000 mg | ORAL_TABLET | Freq: Four times a day (QID) | ORAL | Status: DC | PRN

## 2015-08-21 NOTE — Progress Notes (Signed)
Patient discharged home with daughter. IV was dc'd and was intact. Sutures were removed. Medications and discharge instructions were educated on and the patient stated that she understood.

## 2015-08-21 NOTE — Care Management Note (Signed)
Case Management Note  Patient Details  Name: Heidi Middleton MRN: [id number] Date of Birth: 25-Jul-1945  Subjective/Objective:   AVR                 Action/Plan: Discharge Planning: AVS reviewed:  Chart reveiwed. No NCM needs identified. No PT follow recommended.   PCP- Rowe Robert MD  Expected Discharge Date:  08/21/2015               Expected Discharge Plan:  Home/Self Care  In-House Referral:  NA  Discharge planning Services  CM Consult  Post Acute Care Choice:  NA Choice offered to:  NA  DME Arranged:  N/A DME Agency:  NA  HH Arranged:  NA HH Agency:  NA  Status of Service:  Completed, signed off  Medicare Important Message Given:  Yes Date Medicare IM Given:    Medicare IM give by:    Date Additional Medicare IM Given:    Additional Medicare Important Message give by:     If discussed at [place] of Stay Meetings, dates discussed:    Additional Comments:  Erenest Rasher, RN 08/21/2015, 9:22 AM

## 2015-08-21 NOTE — Progress Notes (Signed)
      [address]       [place] [address]             [phone]      5 Days Post-Op Procedure(s) (LRB): AORTIC VALVE REPLACEMENT (AVR) (N/A) TRANSESOPHAGEAL ECHOCARDIOGRAM (TEE) (N/A)   Subjective:  Feels great.  Ambulating independently, has moved bowels.... Wants to go home  Objective: Vital signs in last 24 hours: Temp:  [98.4 F (36.9 C)-98.5 F (36.9 C)] 98.5 F (36.9 C) (06/04 0449) Pulse Rate:  [78-95] 82 (06/04 0449) Cardiac Rhythm:  [-] Normal sinus rhythm (06/04 0715) Resp:  [14-18] 18 (06/04 0449) BP: (116-155)/(58-69) 155/58 mmHg (06/04 0449) SpO2:  [94 %-97 %] 96 % (06/04 0449) Weight:  [172 lb 3.2 oz (78.109 kg)] 172 lb 3.2 oz (78.109 kg) (06/04 0449)  Intake/Output from previous day: 06/03 0701 - 06/04 0700 In: 240 [P.O.:240] Out: -   General appearance: alert, cooperative and no distress Heart: regular rate and rhythm Lungs: clear to auscultation bilaterally Abdomen: soft, non-tender; bowel sounds normal; no masses,  no organomegaly Extremities: edema trace Wound: clean and dry  Lab Results:  Recent Labs  08/19/15 0244  WBC 11.8*  HGB 9.4*  HCT 28.8*  PLT 129*   BMET:  Recent Labs  08/19/15 0244  NA 132*  K 3.7  CL 99*  CO2 25  GLUCOSE 106*  BUN 16  CREATININE 0.91  CALCIUM 8.9    PT/INR: No results for input(s): LABPROT, INR in the last 72 hours. ABG    Component Value Date/Time   PHART 7.283* 08/16/2015 2118   HCO3 22.8 08/16/2015 2118   TCO2 24 08/17/2015 1628   ACIDBASEDEF 4.0* 08/16/2015 2118   O2SAT 96.0 08/16/2015 2118   CBG (last 3)   Recent Labs  08/19/15 0603 08/20/15 2151 08/21/15 0639  GLUCAP 126* 112* 104*    Assessment/Plan: S/P Procedure(s) (LRB): AORTIC VALVE REPLACEMENT (AVR) (N/A) TRANSESOPHAGEAL ECHOCARDIOGRAM (TEE) (N/A)  1. CV- maintaining NSR, remains hypertensive- continue Lopressor, will increase dose of Lisinopril 2. Pulm- no acute issues, continue IS 3. Renal-  remains hypervolemic, continue Lasix  4. GI- constipation resolved 5. dispo- patient stable, will d/c home today   LOS: 5 days    [place] 08/21/2015

## 2015-08-22 ENCOUNTER — Telehealth: Payer: Self-pay | Admitting: Cardiology

## 2015-08-22 NOTE — Telephone Encounter (Signed)
Placed on Pam's desk.

## 2015-08-22 NOTE — Telephone Encounter (Signed)
Patient husband dropped off handicapped placard application .  Please call when ready for pick up.    Placed in nurse box.

## 2015-08-23 NOTE — Telephone Encounter (Signed)
Per Dr. Yvone Neu instructed patient to take form to primary care physician or surgeon to be completed. Will place form back up front for her husband to pick up. Patient verbalized understanding and had no further questions at this time.

## 2015-08-25 ENCOUNTER — Telehealth: Payer: Self-pay

## 2015-08-25 NOTE — Telephone Encounter (Signed)
Form in your inbox 

## 2015-08-25 NOTE — Telephone Encounter (Signed)
Sarenna Corfield Spouse [phone]  Doren Custard dropped off a disability parking placard form to be filled out, he stated that Roene had a heart attack and needed this form for awhile. Placed in Rx box up front. Call when ready for pick up.

## 2015-08-26 NOTE — Telephone Encounter (Signed)
Done and in IN box 

## 2015-08-26 NOTE — Telephone Encounter (Signed)
Spoke to pt and informed her form is available for pickup from the front desk 

## 2015-09-01 ENCOUNTER — Other Ambulatory Visit: Payer: Self-pay | Admitting: Family Medicine

## 2015-09-01 ENCOUNTER — Encounter: Payer: Self-pay | Admitting: Cardiology

## 2015-09-01 ENCOUNTER — Ambulatory Visit (INDEPENDENT_AMBULATORY_CARE_PROVIDER_SITE_OTHER): Payer: Medicare Other | Admitting: Cardiology

## 2015-09-01 VITALS — BP 126/60 | HR 82 | Ht 68.0 in | Wt 160.8 lb

## 2015-09-01 DIAGNOSIS — I10 Essential (primary) hypertension: Secondary | ICD-10-CM

## 2015-09-01 DIAGNOSIS — I350 Nonrheumatic aortic (valve) stenosis: Secondary | ICD-10-CM | POA: Diagnosis not present

## 2015-09-01 DIAGNOSIS — Z952 Presence of prosthetic heart valve: Secondary | ICD-10-CM

## 2015-09-01 DIAGNOSIS — Z954 Presence of other heart-valve replacement: Secondary | ICD-10-CM

## 2015-09-01 NOTE — Patient Instructions (Addendum)
Medication Instructions:  Your physician recommends that you continue on your current medications as directed. Please refer to the Current Medication list given to you today.   Labwork: None ordered  Testing/Procedures: Your physician has requested that you have an echocardiogram. Echocardiography is a painless test that uses sound waves to create images of your heart. It provides your doctor with information about the size and shape of your heart and how well your heart's chambers and valves are working. This procedure takes approximately one hour. There are no restrictions for this procedure.  Date & Time: ________________________________________________________  Follow-Up: Your physician recommends that you schedule a follow-up appointment in: 3 months with Dr. Yvone Neu  Date & Time: _______________________________________________________  It was a pleasure seeing you today here in the office. Please do not hesitate to give [place] a call back if you have any further questions. [place], BSN      Any Other Special Instructions Will Be Listed Below (If Applicable).     If you need a refill on your cardiac medications before your next appointment, please call your pharmacy.  Echocardiogram An echocardiogram, or echocardiography, uses sound waves (ultrasound) to produce an image of your heart. The echocardiogram is simple, painless, obtained within a short period of time, and offers valuable information to your health care provider. The images from an echocardiogram can provide information such as:  Evidence of coronary artery disease (CAD).  Heart size.  Heart muscle function.  Heart valve function.  Aneurysm detection.  Evidence of a past heart attack.  Fluid buildup around the heart.  Heart muscle thickening.  Assess heart valve function. LET [facility] CARE PROVIDER KNOW ABOUT:  Any allergies you have.  All medicines you are taking, including  vitamins, herbs, eye drops, creams, and over-the-counter medicines.  Previous problems you or members of your family have had with the use of anesthetics.  Any blood disorders you have.  Previous surgeries you have had.  Medical conditions you have.  Possibility of pregnancy, if this applies. BEFORE THE PROCEDURE  No special preparation is needed. Eat and drink normally.  PROCEDURE   In order to produce an image of your heart, gel will be applied to your chest and a wand-like tool (transducer) will be moved over your chest. The gel will help transmit the sound waves from the transducer. The sound waves will harmlessly bounce off your heart to allow the heart images to be captured in real-time motion. These images will then be recorded.  You may need an IV to receive a medicine that improves the quality of the pictures. AFTER THE PROCEDURE You may return to your normal schedule including diet, activities, and medicines, unless your health care provider tells you otherwise.   This information is not intended to replace advice given to you by your health care provider. Make sure you discuss any questions you have with your health care provider.   Document Released: 03/02/2000 Document Revised: 03/26/2014 Document Reviewed: 11/10/2012 Elsevier Interactive Patient Education [employer].

## 2015-09-01 NOTE — Progress Notes (Signed)
Cardiology Office Note   Date:  09/01/2015   ID:  Heidi Middleton, DOB [birth year], MRN [id number]  Referring Doctor:  Loura Pardon, MD   Cardiologist:   Wende Bushy, MD   Reason for consultation:  Chief Complaint  Patient presents with  . Establish Care    post op      History of Present Illness: Heidi Middleton is a 70 y.o. female who presents for s/p AVR 08/16/2015  Patient is doing very well. She has a little bit of chest wall soreness but she does not even need to take any pain medications. No shortness of breath. No palpitations. No issues postsurgery. She would like to start cardiac rehabilitation as she is getting bored at home.  No fever, cough, colds, abdominal pain. No orthopnea, edema, PND.  ROS:  Please see the history of present illness. Aside from mentioned under HPI, all other systems are reviewed and negative.    Past Medical History  Diagnosis Date  . Colonic polyp   . Hypertension   . Aortic stenosis   . GERD (gastroesophageal reflux disease)   . Headache     migraines prior to hysterectomy  . PONV (postoperative nausea and vomiting)     nausea  . Shingles     Past Surgical History  Procedure Laterality Date  . Abdominal hysterectomy    . Knee surgery  10/2005    calcinosis - in office procedure  . Eye surgery N/A     detatched retina  . Breast biopsy Right 1990    EXCISIONAL - NEG  . Cardiac catheterization Bilateral 08/04/2015    Procedure: Right/Left Heart Cath and Coronary Angiography;  Surgeon: Wellington Hampshire, MD;  Location: [facility];  Service: Cardiovascular;  Laterality: Bilateral;  . Appendectomy      taken out with hysterectomy  . Aortic valve replacement N/A 08/16/2015    Procedure: AORTIC VALVE REPLACEMENT (AVR);  Surgeon: Ivin Poot, MD;  Location: [facility];  Service: Open Heart Surgery;  Laterality: N/A;  . Tee without cardioversion N/A 08/16/2015    Procedure: TRANSESOPHAGEAL ECHOCARDIOGRAM (TEE);  Surgeon: Ivin Poot, MD;  Location: [facility];  Service: Open Heart Surgery;  Laterality: N/A;     reports that she quit smoking about 13 years ago. She has never used smokeless tobacco. She reports that she does not drink alcohol or use illicit drugs.   family history includes Cancer in her brother, father, and mother; Diabetes in her maternal grandmother; Heart disease in her sister; Hypertension in her brother and brother; Lung cancer in her brother; Obesity in her sister; Stroke in her mother. There is no history of Breast cancer.   Current Outpatient Prescriptions  Medication Sig Dispense Refill  . acetaminophen (TYLENOL) 500 MG tablet Take 2 tablets (1,000 mg total) by mouth every 6 (six) hours as needed for mild pain or fever. 30 tablet 0  . aspirin EC 325 MG EC tablet Take 1 tablet (325 mg total) by mouth daily. 30 tablet 0  . atorvastatin (LIPITOR) 10 MG tablet TAKE 0.5 TABLETS (5 MG TOTAL) BY MOUTH DAILY. 45 tablet 3  . cholecalciferol (VITAMIN D) 1000 UNITS tablet Take 1,000 Units by mouth 2 (two) times daily.     Marland Kitchen esomeprazole (NEXIUM) 40 MG capsule TAKE 1 CAPSULE (40 MG TOTAL) BY MOUTH DAILY BEFORE BREAKFAST. 90 capsule 3  . lisinopril (PRINIVIL,ZESTRIL) 10 MG tablet Take 1 tablet (10 mg total) by mouth daily. 30 tablet  3  . metoprolol tartrate (LOPRESSOR) 25 MG tablet Take 0.5 tablets (12.5 mg total) by mouth 2 (two) times daily. 30 tablet 3  . Multiple Vitamin (MULTIVITAMIN) tablet Take 1 tablet by mouth daily.    . traMADol (ULTRAM) 50 MG tablet Take 1-2 tablets (50-100 mg total) by mouth every 4 (four) hours as needed for moderate pain. 30 tablet 0   No current facility-administered medications for this visit.    Allergies: Omeprazole; Ranitidine hcl; and Codeine    PHYSICAL EXAM: VS:  BP 126/60 mmHg  Pulse 82  Ht 5\' 8"  (1.727 m)  Wt 160 lb 12.8 oz (72.938 kg)  BMI 24.46 kg/m2  SpO2 94% , Body mass index is 24.46 kg/(m^2). Wt Readings from Last 3 Encounters:  09/01/15 160 lb 12.8  oz (72.938 kg)  08/21/15 172 lb 3.2 oz (78.109 kg)  08/11/15 165 lb (74.844 kg)    GENERAL:  well developed, well nourished, not in acute distress HEENT: normocephalic, pink conjunctivae, anicteric sclerae, no xanthelasma, normal dentition, oropharynx clear NECK:  no neck vein engorgement, JVP normal, no hepatojugular reflux, carotid upstroke brisk and symmetric, no bruit, no thyromegaly, no lymphadenopathy LUNGS:  good respiratory effort, clear to auscultation bilaterally CV:  PMI not displaced, no thrills, no lifts, S1 and S2 within normal limits, no palpable S3 or S4, , no rubs, no gallops, 2/6 systolic ejection murmur nonradiating. Sternotomy scar healing very well. ABD:  Soft, nontender, nondistended, normoactive bowel sounds, no abdominal aortic bruit, no hepatomegaly, no splenomegaly MS: nontender back, no kyphosis, no scoliosis, no joint deformities EXT:  2+ DP/PT pulses, no edema, no varicosities, no cyanosis, no clubbing SKIN: warm, nondiaphoretic, normal turgor, no ulcers NEUROPSYCH: alert, oriented to person, place, and time, sensory/motor grossly intact, normal mood, appropriate affect  Recent Labs: 06/20/2015: TSH 1.62 08/11/2015: ALT 22 08/17/2015: Magnesium 2.3 08/19/2015: BUN 16; Creatinine, Ser 0.91; Hemoglobin 9.4*; Platelets 129*; Potassium 3.7; Sodium 132*   Lipid Panel    Component Value Date/Time   CHOL 177 06/20/2015 0826   TRIG 73.0 06/20/2015 0826   HDL 78.30 06/20/2015 0826   CHOLHDL 2 06/20/2015 0826   VLDL 14.6 06/20/2015 0826   LDLCALC 84 06/20/2015 0826     Other studies Reviewed:  EKG:  The ekg from 07/28/2015 was personally reviewed by me and it revealed sinus rhythm, 82 BPM, nonspecific ST abnormality. EKG from 09/01/2015 was personally reviewed by me and it reveals sinus rhythm, 74 BPM.  Additional studies/ records that were reviewed personally reviewed by me today include:  Echo 07/06/2015: Left ventricle: The cavity size was normal. There was  focal basal  hypertrophy. Systolic function was vigorous. The estimated  ejection fraction was in the range of 65% to 70%. Wall motion was  normal; there were no regional wall motion abnormalities. Doppler  parameters are consistent with abnormal left ventricular  relaxation (grade 1 diastolic dysfunction). - Aortic valve: There was moderate to likely severe stenosis.  Velocity ratio is 0.34. AV Vmax is 3.50m/s. AVA by Vmax is  0.9cm2. AVA by VTI is 1.0cm2 Valve area (VTI): 0.95 cm^2. Valve  area (Vmax): 0.98 cm^2. Valve area (Vmean): 1.07 cm^2. - Mitral valve: Mildly calcified annulus. No AI  [facility] 08/04/2015:  Prox RCA to Mid RCA lesion, 30% stenosed.  1. Mild nonobstructive coronary artery disease. 2. Severe aortic valve stenosis with a mean gradient of 51.6 mmHg with a valve area of 0.67. 3. Right heart catheterization showed normal pulmonary pressure, normal cardiac output and mildly elevated  pulmonary wedge pressure.  Recommendations:  Aortic valve replacement.     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 No evidence of congestive heart failure. She is due to see the surgeon 06/28/2017for follow-up. Continue aspirin 325 mg daily, beta blocker, ACE inhibitor, statin therapy. Likely decrease aspirin to 81 mg by mouth daily after 3 months post surgery/per surgery recommendation Referred to cardiac rehabilitation Establish baseline echo postop mid July/6 weeks postop.  CAD Nonobstructive, Mild disease She has no angina or shortness of breath. Ejection fraction is preserved. Continue aspirin, beta blocker, ACE inhibitor, statin therapy. Ideal LDL goal is less than 70.  HTN BP is well controlled. Continue monitoring BP. Continue current medical therapy and lifestyle changes.  Current medicines are reviewed at length with  the patient today.  The patient does not have concerns regarding medicines.  Labs/ tests ordered today include:  Orders Placed This Encounter  Procedures  . EKG 12-Lead    I had a lengthy and detailed discussion with the patient regarding diagnoses, prognosis, diagnostic options, treatment options , and side effects of medications.   Disposition:   FU with undersigned in 3 months     Signed, Wende Bushy, MD  09/01/2015 3:07 PM    [place]

## 2015-09-12 ENCOUNTER — Encounter: Payer: Medicare Other | Attending: Cardiothoracic Surgery | Admitting: *Deleted

## 2015-09-12 VITALS — Ht 68.0 in | Wt 160.3 lb

## 2015-09-12 DIAGNOSIS — Z952 Presence of prosthetic heart valve: Secondary | ICD-10-CM | POA: Insufficient documentation

## 2015-09-12 NOTE — Patient Instructions (Signed)
Patient Instructions  Patient Details  Name: AROUSH Middleton MRN: [id number] Date of Birth: 02-14-1946 Referring Provider:  Ivin Poot, MD  Below are the personal goals you chose as well as exercise and nutrition goals. Our goal is to help you keep on track towards obtaining and maintaining your goals. We will be discussing your progress on these goals with you throughout the program.  Initial Exercise Prescription:     Initial Exercise Prescription - 09/12/15 1200    Date of Initial Exercise RX and Referring Provider   Date 09/12/15   Referring Provider [name]   Treadmill   MPH 2.5   Grade 1   Minutes 15   METs 3.26   Recumbant Bike   Level 1   RPM 50   Watts 25   Minutes 15   METs 3.12   NuStep   Level 2   Watts 50   Minutes 15   METs 3.4   REL-XR   Level 2   Watts 50   Minutes 15   METs 3.4   T5 Nustep   Level 1   Watts 50   Minutes 15   METs 3.4   Biostep-RELP   Level 2   Watts 50   Minutes 15   METs 3.4   Prescription Details   Frequency (times per week) 3   Duration Progress to 30 minutes of continuous aerobic without signs/symptoms of physical distress   Intensity   THRR 40-80% of Max Heartrate 112-137   Ratings of Perceived Exertion 11-13   Perceived Dyspnea 0-4   Progression   Progression Continue to progress workloads to maintain intensity without signs/symptoms of physical distress.   Resistance Training   Training Prescription Yes   Weight 2   Reps 10-15      Exercise Goals: Frequency: Be able to perform aerobic exercise three times per week working toward 3-5 days per week.  Intensity: Work with a perceived exertion of 11 (fairly light) - 15 (hard) as tolerated. Follow your new exercise prescription and watch for changes in prescription as you progress with the program. Changes will be reviewed with you when they are made.  Duration: You should be able to do 30 minutes of continuous aerobic exercise in addition to a 5 minute  warm-up and a 5 minute cool-down routine.  Nutrition Goals: Your personal nutrition goals will be established when you do your nutrition analysis with the dietician.  The following are nutrition guidelines to follow: Cholesterol < 200mg /day Sodium < 1500mg /day Fiber: Women over 50 yrs - 21 grams per day  Personal Goals:     Personal Goals and Risk Factors at Admission - 09/12/15 1254    Core Components/Risk Factors/Patient Goals on Admission    Weight Management Weight Maintenance   Admit Weight 160 lb 3 oz (72.661 kg)   Expected Outcomes Weight Maintenance: Understanding of the daily nutrition guidelines, which includes 25-35% calories from fat, 7% or less cal from saturated fats, less than 200mg  cholesterol, less than 1.5gm of sodium, & 5 or more servings of fruits and vegetables daily   Sedentary Yes   Intervention Provide advice, education, support and counseling about physical activity/exercise needs.;Develop an individualized exercise prescription for aerobic and resistive training based on initial evaluation findings, risk stratification, comorbidities and participant's personal goals.   Expected Outcomes Achievement of increased cardiorespiratory fitness and enhanced flexibility, muscular endurance and strength shown through measurements of functional capacity and personal statement of participant.   Increase Strength  and Stamina Yes   Intervention Provide advice, education, support and counseling about physical activity/exercise needs.;Develop an individualized exercise prescription for aerobic and resistive training based on initial evaluation findings, risk stratification, comorbidities and participant's personal goals.   Expected Outcomes Achievement of increased cardiorespiratory fitness and enhanced flexibility, muscular endurance and strength shown through measurements of functional capacity and personal statement of participant.   Hypertension Yes   Intervention Provide  education on lifestyle modifcations including regular physical activity/exercise, weight management, moderate sodium restriction and increased consumption of fresh fruit, vegetables, and low fat dairy, alcohol moderation, and smoking cessation.;Monitor prescription use compliance.   Expected Outcomes Short Term: Continued assessment and intervention until BP is < 140/27mm HG in hypertensive participants. < 130/31mm HG in hypertensive participants with diabetes, heart failure or chronic kidney disease.;Long Term: Maintenance of blood pressure at goal levels.   Lipids Yes   Intervention Provide education and support for participant on nutrition & aerobic/resistive exercise along with prescribed medications to achieve LDL 70mg , HDL >40mg .   Expected Outcomes Short Term: Participant states understanding of desired cholesterol values and is compliant with medications prescribed. Participant is following exercise prescription and nutrition guidelines.;Long Term: Cholesterol controlled with medications as prescribed, with individualized exercise RX and with personalized nutrition plan. Value goals: LDL < 70mg , HDL > 40 mg.   Stress Yes   Intervention Offer individual and/or small group education and counseling on adjustment to heart disease, stress management and health-related lifestyle change. Teach and support self-help strategies.;Refer participants experiencing significant psychosocial distress to appropriate mental health specialists for further evaluation and treatment. When possible, include family members and significant others in education/counseling sessions.   Expected Outcomes Short Term: Participant demonstrates changes in health-related behavior, relaxation and other stress management skills, ability to obtain effective social support, and compliance with psychotropic medications if prescribed.;Long Term: Emotional wellbeing is indicated by absence of clinically significant psychosocial distress or  social isolation.      Tobacco Use Initial Evaluation: History  Smoking status  . Former Smoker  . Quit date: 07/07/2002  Smokeless tobacco  . Never Used    Copy of goals given to participant.

## 2015-09-12 NOTE — Progress Notes (Signed)
Cardiac Individual Treatment Plan  Patient Details  Name: DEMI DREESE MRN: [id number] Date of Birth: 1945-09-08 Referring Provider:        Cardiac Rehab from 09/12/2015 in [facility] Cardiac and Pulmonary Rehab   Referring Provider  [name]      Initial Encounter Date:       Cardiac Rehab from 09/12/2015 in [facility] Cardiac and Pulmonary Rehab   Date  09/12/15   Referring Provider  [name]      Visit Diagnosis: S/P aortic valve replacement  Patient's Home Medications on Admission:  Current outpatient prescriptions:  .  acetaminophen (TYLENOL) 500 MG tablet, Take 2 tablets (1,000 mg total) by mouth every 6 (six) hours as needed for mild pain or fever., Disp: 30 tablet, Rfl: 0 .  aspirin EC 325 MG EC tablet, Take 1 tablet (325 mg total) by mouth daily., Disp: 30 tablet, Rfl: 0 .  atorvastatin (LIPITOR) 10 MG tablet, TAKE 0.5 TABLETS (5 MG TOTAL) BY MOUTH DAILY., Disp: 45 tablet, Rfl: 3 .  cholecalciferol (VITAMIN D) 1000 UNITS tablet, Take 1,000 Units by mouth 2 (two) times daily. , Disp: , Rfl:  .  esomeprazole (NEXIUM) 40 MG capsule, TAKE 1 CAPSULE (40 MG TOTAL) BY MOUTH DAILY BEFORE BREAKFAST., Disp: 90 capsule, Rfl: 3 .  lisinopril (PRINIVIL,ZESTRIL) 10 MG tablet, Take 1 tablet (10 mg total) by mouth daily., Disp: 30 tablet, Rfl: 3 .  metoprolol tartrate (LOPRESSOR) 25 MG tablet, Take 0.5 tablets (12.5 mg total) by mouth 2 (two) times daily., Disp: 30 tablet, Rfl: 3 .  Multiple Vitamin (MULTIVITAMIN) tablet, Take 1 tablet by mouth daily., Disp: , Rfl:  .  traMADol (ULTRAM) 50 MG tablet, Take 1-2 tablets (50-100 mg total) by mouth every 4 (four) hours as needed for moderate pain., Disp: 30 tablet, Rfl: 0  Past Medical History: Past Medical History  Diagnosis Date  . Colonic polyp   . Hypertension   . Aortic stenosis   . GERD (gastroesophageal reflux disease)   . Headache     migraines prior to hysterectomy  . PONV (postoperative nausea and vomiting)     nausea  . Shingles      Tobacco Use: History  Smoking status  . Former Smoker  . Quit date: 07/07/2002  Smokeless tobacco  . Never Used    Labs: Recent Review Flowsheet Data    Labs for ITP Cardiac and Pulmonary Rehab Latest Ref Rng 08/16/2015 08/16/2015 08/16/2015 08/17/2015 08/17/2015   PHART 7.350 - 7.450 - 7.336(L) 7.283(L) - -   PCO2ART 35.0 - 45.0 mmHg - 41.9 47.7(H) - -   HCO3 20.0 - 24.0 mEq/L - 22.7 22.8 - -   TCO2 0 - 100 mmol/L 22 24 24 16 24    ACIDBASEDEF 0.0 - 2.0 mmol/L - 3.0(H) 4.0(H) - -   O2SAT - - 98.0 96.0 - -       Exercise Target Goals: Date: 09/12/15  Exercise Program Goal: Individual exercise prescription set with THRR, safety & activity barriers. Participant demonstrates ability to understand and report RPE using BORG scale, to self-measure pulse accurately, and to acknowledge the importance of the exercise prescription.  Exercise Prescription Goal: Starting with aerobic activity 30 plus minutes a day, 3 days per week for initial exercise prescription. Provide home exercise prescription and guidelines that participant acknowledges understanding prior to discharge.  Activity Barriers & Risk Stratification:     Activity Barriers & Cardiac Risk Stratification - 09/12/15 1250    Activity Barriers & Cardiac Risk  Stratification   Activity Barriers None   Cardiac Risk Stratification High      6 Minute Walk:     6 Minute Walk      09/12/15 1251       6 Minute Walk   Distance 1365 feet     Walk Time 6 minutes     # of Rest Breaks 0     MPH 2.6     METS 3.6     RPE 11     VO2 Peak 12.7     Symptoms No     Resting HR 88 bpm     Resting BP 132/70 mmHg     Max Ex. HR 126 bpm     Max Ex. BP 154/68 mmHg        Initial Exercise Prescription:     Initial Exercise Prescription - 09/12/15 1200    Date of Initial Exercise RX and Referring Provider   Date 09/12/15   Referring Provider [name]   Treadmill   MPH 2.5   Grade 1   Minutes 15   METs 3.26    Recumbant Bike   Level 1   RPM 50   Watts 25   Minutes 15   METs 3.12   NuStep   Level 2   Watts 50   Minutes 15   METs 3.4   REL-XR   Level 2   Watts 50   Minutes 15   METs 3.4   T5 Nustep   Level 1   Watts 50   Minutes 15   METs 3.4   Biostep-RELP   Level 2   Watts 50   Minutes 15   METs 3.4   Prescription Details   Frequency (times per week) 3   Duration Progress to 30 minutes of continuous aerobic without signs/symptoms of physical distress   Intensity   THRR 40-80% of Max Heartrate 112-137   Ratings of Perceived Exertion 11-13   Perceived Dyspnea 0-4   Progression   Progression Continue to progress workloads to maintain intensity without signs/symptoms of physical distress.   Resistance Training   Training Prescription Yes   Weight 2   Reps 10-15      Perform Capillary Blood Glucose checks as needed.  Exercise Prescription Changes:   Exercise Comments:   Discharge Exercise Prescription (Final Exercise Prescription Changes):   Nutrition:  Target Goals: Understanding of nutrition guidelines, daily intake of sodium 1500mg , cholesterol 200mg , calories 30% from fat and 7% or less from saturated fats, daily to have 5 or more servings of fruits and vegetables.  Biometrics:     Pre Biometrics - 09/12/15 1250    Pre Biometrics   Height 5\' 8"  (1.727 m)   Weight 160 lb 4.8 oz (72.712 kg)   Waist Circumference 33.13 inches   Hip Circumference 41.25 inches   Waist to Hip Ratio 0.8 %   BMI (Calculated) 24.4       Nutrition Therapy Plan and Nutrition Goals:   Nutrition Discharge: Rate Your Plate Scores:   Nutrition Goals Re-Evaluation:   Psychosocial: Target Goals: Acknowledge presence or absence of depression, maximize coping skills, provide positive support system. Participant is able to verbalize types and ability to use techniques and skills needed for reducing stress and depression.  Initial Review & Psychosocial Screening:      Initial Psych Review & Screening - 09/12/15 1545    Initial Review   Current issues with Current Sleep Concerns;Current Stress Concerns   Source  of Stress Concerns Unable to participate in former interests or hobbies   Comments Patient states she was very active prior to surgery, as she is the caregiver for her husband.  Prior to surgery she was able to garden, exercise 4 x /week, mow the yard, and do the weed eating in and around her home.  She states she is bored.     Family Dynamics   Good Support System? No   Strains Illness and family care strain   Concerns No support system   Comments Upon asking Dariyah about good family support she stated, "no!"  She is the caregiver for her husband and she feels like he could do more for himself, but he refuses.  Also, she was keeping her grandchildren every morning from 6 a.m. to 8 a.m. until the school / day care opened.     Barriers   Psychosocial barriers to participate in program The patient should benefit from training in stress management and relaxation.   Screening Interventions   Interventions Program counselor consult;Encouraged to exercise      Quality of Life Scores:     Quality of Life - 09/12/15 1550    Quality of Life Scores   Health/Function Pre 21.68 %   Socioeconomic Pre 27.92 %   Psych/Spiritual Pre 21.36 %   Family Pre 19 %   GLOBAL Pre 22.36 %      PHQ-9:     Recent Review Flowsheet Data    Depression screen [facility] 2/9 09/12/2015 06/20/2015 06/21/2014 06/15/2013 06/13/2012   Decreased Interest 1 0 0 0 0   Down, Depressed, Hopeless 1 0 0 0 0   PHQ - 2 Score 2 0 0 0 0   Altered sleeping 3 - - - -   Tired, decreased energy 2 - - - -   Change in appetite 3 - - - -   Feeling bad or failure about yourself  1 - - - -   Trouble concentrating 3 - - - -   Moving slowly or fidgety/restless 0 - - - -   Suicidal thoughts 0 - - - -   PHQ-9 Score 14 - - - -   Difficult doing work/chores Somewhat difficult  - - - -       Psychosocial Evaluation and Intervention:   Psychosocial Re-Evaluation:   Vocational Rehabilitation: Provide vocational rehab assistance to qualifying candidates.   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        Education: Education Goals: Education classes will be provided on a weekly basis, covering required topics. Participant will state understanding/return demonstration of topics presented.  Learning Barriers/Preferences:     Learning Barriers/Preferences - 09/12/15 1250    Learning Barriers/Preferences   Learning Barriers None   Learning Preferences None      Education Topics: General Nutrition Guidelines/Fats and Fiber: -Group instruction provided by verbal, written material, models and posters to present the general guidelines for heart healthy nutrition. Gives an explanation and review of dietary fats and fiber.   Controlling Sodium/Reading Food Labels: -Group verbal and written material supporting the discussion of sodium use in heart healthy nutrition. Review and explanation with models, verbal and written materials for utilization of the food label.   Exercise Physiology & Risk Factors: - Group verbal and  written instruction with models to review the exercise physiology of the cardiovascular system and associated critical values. Details cardiovascular disease risk factors and the goals associated with each risk factor.   Aerobic Exercise & Resistance Training: - Gives group verbal and written discussion on the health impact of inactivity. On the components of aerobic and resistive training programs and the benefits of this training and how to safely progress through these programs.   Flexibility, Balance, General Exercise Guidelines: - Provides  group verbal and written instruction on the benefits of flexibility and balance training programs. Provides general exercise guidelines with specific guidelines to those with heart or lung disease. Demonstration and skill practice provided.   Stress Management: - Provides group verbal and written instruction about the health risks of elevated stress, cause of high stress, and healthy ways to reduce stress.   Depression: - Provides group verbal and written instruction on the correlation between heart/lung disease and depressed mood, treatment options, and the stigmas associated with seeking treatment.   Anatomy & Physiology of the Heart: - Group verbal and written instruction and models provide basic cardiac anatomy and physiology, with the coronary electrical and arterial systems. Review of: AMI, Angina, Valve disease, Heart Failure, Cardiac Arrhythmia, Pacemakers, and the ICD.   Cardiac Procedures: - Group verbal and written instruction and models to describe the testing methods done to diagnose heart disease. Reviews the outcomes of the test results. Describes the treatment choices: Medical Management, Angioplasty, or Coronary Bypass Surgery.   Cardiac Medications: - Group verbal and written instruction to review commonly prescribed medications for heart disease. Reviews the medication, class of the drug, and side effects. Includes the steps to properly store meds and maintain the prescription regimen.   Go Sex-Intimacy & Heart Disease, Get SMART - Goal Setting: - Group verbal and written instruction through game format to discuss heart disease and the return to sexual intimacy. Provides group verbal and written material to discuss and apply goal setting through the application of the S.M.A.R.T. Method.   Other Matters of the Heart: - Provides group verbal, written materials and models to describe Heart Failure, Angina, Valve Disease, and Diabetes in the realm of heart disease. Includes  description of the disease process and treatment options available to the cardiac patient.   Exercise & Equipment Safety: - Individual verbal instruction and demonstration of equipment use and safety with use of the equipment.          Cardiac Rehab from 09/12/2015 in [facility] Cardiac and Pulmonary Rehab   Date  09/12/15   Educator  DW   Instruction Review Code  1- partially meets, needs review/practice      Infection Prevention: - Provides verbal and written material to individual with discussion of infection control including proper hand washing and proper equipment cleaning during exercise session.      Cardiac Rehab from 09/12/2015 in [facility] Cardiac and Pulmonary Rehab   Date  09/12/15   Educator  DW   Instruction Review Code  2- meets goals/outcomes      Falls Prevention: - Provides verbal and written material to individual with discussion of falls prevention and safety.      Cardiac Rehab from 09/12/2015 in [facility] Cardiac and Pulmonary Rehab   Date  09/12/15   Educator  DW   Instruction Review Code  2- meets goals/outcomes      Diabetes: - Individual verbal and written instruction to review signs/symptoms of diabetes, desired ranges of glucose level fasting, after meals and with  exercise. Advice that pre and post exercise glucose checks will be done for 3 sessions at entry of program.    Knowledge Questionnaire Score:   Core Components/Risk Factors/Patient Goals at Admission:     Personal Goals and Risk Factors at Admission - 09/12/15 1254    Core Components/Risk Factors/Patient Goals on Admission    Weight Management Weight Maintenance   Admit Weight 160 lb 3 oz (72.661 kg)   Expected Outcomes Weight Maintenance: Understanding of the daily nutrition guidelines, which includes 25-35% calories from fat, 7% or less cal from saturated fats, less than 200mg  cholesterol, less than 1.5gm of sodium, & 5 or more servings of fruits and vegetables daily   Sedentary Yes    Intervention Provide advice, education, support and counseling about physical activity/exercise needs.;Develop an individualized exercise prescription for aerobic and resistive training based on initial evaluation findings, risk stratification, comorbidities and participant's personal goals.   Expected Outcomes Achievement of increased cardiorespiratory fitness and enhanced flexibility, muscular endurance and strength shown through measurements of functional capacity and personal statement of participant.   Increase Strength and Stamina Yes   Intervention Provide advice, education, support and counseling about physical activity/exercise needs.;Develop an individualized exercise prescription for aerobic and resistive training based on initial evaluation findings, risk stratification, comorbidities and participant's personal goals.   Expected Outcomes Achievement of increased cardiorespiratory fitness and enhanced flexibility, muscular endurance and strength shown through measurements of functional capacity and personal statement of participant.   Hypertension Yes   Intervention Provide education on lifestyle modifcations including regular physical activity/exercise, weight management, moderate sodium restriction and increased consumption of fresh fruit, vegetables, and low fat dairy, alcohol moderation, and smoking cessation.;Monitor prescription use compliance.   Expected Outcomes Short Term: Continued assessment and intervention until BP is < 140/68mm HG in hypertensive participants. < 130/6mm HG in hypertensive participants with diabetes, heart failure or chronic kidney disease.;Long Term: Maintenance of blood pressure at goal levels.   Lipids Yes   Intervention Provide education and support for participant on nutrition & aerobic/resistive exercise along with prescribed medications to achieve LDL 70mg , HDL >40mg .   Expected Outcomes Short Term: Participant states understanding of desired cholesterol  values and is compliant with medications prescribed. Participant is following exercise prescription and nutrition guidelines.;Long Term: Cholesterol controlled with medications as prescribed, with individualized exercise RX and with personalized nutrition plan. Value goals: LDL < 70mg , HDL > 40 mg.   Stress Yes   Intervention Offer individual and/or small group education and counseling on adjustment to heart disease, stress management and health-related lifestyle change. Teach and support self-help strategies.;Refer participants experiencing significant psychosocial distress to appropriate mental health specialists for further evaluation and treatment. When possible, include family members and significant others in education/counseling sessions.   Expected Outcomes Short Term: Participant demonstrates changes in health-related behavior, relaxation and other stress management skills, ability to obtain effective social support, and compliance with psychotropic medications if prescribed.;Long Term: Emotional wellbeing is indicated by absence of clinically significant psychosocial distress or social isolation.      Core Components/Risk Factors/Patient Goals Review:    Core Components/Risk Factors/Patient Goals at Discharge (Final Review):    ITP Comments:   Comments:  Nelwyn will start Cardiac Rehab on Wednesday morning, June 28th, 2017 at 0730.  She is anxious to get started, as she has not been able to exercise since surgery.  Prior to surgery she exercised 4 x / week and was very active working in her yard and garden.  Her PHQ-9 score  was 14 due in part to difficulty sleeping, activity restrictions, and inability to exercise since surgery. She states she is bored.  Prior to surgery she described herself as being happy.  Lucianne Lei to see patient on Wednesday when patient starts program.

## 2015-09-13 ENCOUNTER — Other Ambulatory Visit: Payer: Self-pay | Admitting: Cardiothoracic Surgery

## 2015-09-13 DIAGNOSIS — Z952 Presence of prosthetic heart valve: Secondary | ICD-10-CM

## 2015-09-14 ENCOUNTER — Encounter: Payer: Medicare Other | Admitting: *Deleted

## 2015-09-14 ENCOUNTER — Encounter: Payer: Self-pay | Admitting: Cardiothoracic Surgery

## 2015-09-14 ENCOUNTER — Ambulatory Visit (INDEPENDENT_AMBULATORY_CARE_PROVIDER_SITE_OTHER): Payer: Self-pay | Admitting: Cardiothoracic Surgery

## 2015-09-14 ENCOUNTER — Ambulatory Visit
Admission: RE | Admit: 2015-09-14 | Discharge: 2015-09-14 | Disposition: A | Discharge status: Home / Self Care | Payer: Medicare Other | Source: Ambulatory Visit | Attending: Cardiothoracic Surgery | Admitting: Cardiothoracic Surgery

## 2015-09-14 VITALS — BP 130/77 | HR 88 | Resp 16 | Ht 68.0 in | Wt 160.0 lb

## 2015-09-14 DIAGNOSIS — Z952 Presence of prosthetic heart valve: Secondary | ICD-10-CM

## 2015-09-14 DIAGNOSIS — Z954 Presence of other heart-valve replacement: Secondary | ICD-10-CM

## 2015-09-14 DIAGNOSIS — I350 Nonrheumatic aortic (valve) stenosis: Secondary | ICD-10-CM

## 2015-09-14 NOTE — Progress Notes (Signed)
Daily Session Note  Patient Details  Name: Heidi Middleton MRN: [id number] Date of Birth: 06/23/1945 Referring Provider:        Cardiac Rehab from 09/12/2015 in [facility] Cardiac and Pulmonary Rehab   Referring Provider  Van Trigt      Encounter Date: 09/14/2015  Check In:     Session Check In - 09/14/15 0848    Check-In   Location [facility]-Cardiac & Pulmonary Rehab   Staff Present Carroll Enterkin, RN, BSN;Amanda Sommer, BA, ACSM CEP, Exercise Physiologist;Mary Jo Abernethy, RN, BSN, MA   Supervising physician immediately available to respond to emergencies See telemetry face sheet for immediately available ER MD   Medication changes reported     No   Fall or balance concerns reported    No   Warm-up and Cool-down Performed on first and last piece of equipment   Resistance Training Performed No   VAD Patient? No   Pain Assessment   Currently in Pain? No/denies         Goals Met:  Proper associated with RPD/PD & O2 Sat Exercise tolerated well No report of cardiac concerns or symptoms  Goals Unmet:  Not Applicable  Comments:     Dr. Mark Miller is Medical Director for [facility] and [facility]. 

## 2015-09-14 NOTE — Progress Notes (Signed)
PCP is Loura Pardon, MD Referring Provider is Wellington Hampshire, MD  Chief Complaint  Patient presents with  . Routine Post Op    s/p AVR 08/16/15 with a CXR...has had post op visit with Dr. Yvone Neu and referred to cardiac rehab    HPI:One month follow-up after aVR for aortic stenosis using a 21 mm pericardial valve. The patient is done well and has noticed an increase in exercise tolerance after replacement of her stenotic aortic valve. She has entered cardiac rehabilitation at [facility] and is progressing nicely. The surgical incision is healing well and she has no evidence of or problems with edema or heart failure. She is maintained sinus rhythm after surgery and she is not on Coumadin. She is ready to start driving and increase her activity levels with a maximum lifting limit at this time of 20 pounds until 3 months after surgery or 11/18/2015.   Past Medical History  Diagnosis Date  . Colonic polyp   . Hypertension   . Aortic stenosis   . GERD (gastroesophageal reflux disease)   . Headache     migraines prior to hysterectomy  . PONV (postoperative nausea and vomiting)     nausea  . Shingles     Past Surgical History  Procedure Laterality Date  . Abdominal hysterectomy    . Knee surgery  10/2005    calcinosis - in office procedure  . Eye surgery N/A     detatched retina  . Breast biopsy Right 1990    EXCISIONAL - NEG  . Cardiac catheterization Bilateral 08/04/2015    Procedure: Right/Left Heart Cath and Coronary Angiography;  Surgeon: Wellington Hampshire, MD;  Location: [place] [facility];  Service: Cardiovascular;  Laterality: Bilateral;  . Appendectomy      taken out with hysterectomy  . Aortic valve replacement N/A 08/16/2015    Procedure: AORTIC VALVE REPLACEMENT (AVR);  Surgeon: Ivin Poot, MD;  Location: [place];  Service: Open Heart Surgery;  Laterality: N/A;  . Tee without cardioversion N/A 08/16/2015    Procedure: TRANSESOPHAGEAL ECHOCARDIOGRAM  (TEE);  Surgeon: Ivin Poot, MD;  Location: [place];  Service: Open Heart Surgery;  Laterality: N/A;    Family History  Problem Relation Age of Onset  . Cancer Mother     lung  . Stroke Mother   . Cancer Father     pancreatic  . Heart disease Sister   . Hypertension Brother   . Cancer Brother   . Diabetes Maternal Grandmother   . Hypertension Brother   . Obesity Sister   . Lung cancer Brother   . Breast cancer Neg Hx     Social History Social History  Substance Use Topics  . Smoking status: Former Smoker    Quit date: 07/07/2002  . Smokeless tobacco: Never Used  . Alcohol Use: No    Current Outpatient Prescriptions  Medication Sig Dispense Refill  . acetaminophen (TYLENOL) 500 MG tablet Take 2 tablets (1,000 mg total) by mouth every 6 (six) hours as needed for mild pain or fever. 30 tablet 0  . aspirin EC 325 MG EC tablet Take 1 tablet (325 mg total) by mouth daily. 30 tablet 0  . atorvastatin (LIPITOR) 10 MG tablet TAKE 0.5 TABLETS (5 MG TOTAL) BY MOUTH DAILY. 45 tablet 3  . cholecalciferol (VITAMIN D) 1000 UNITS tablet Take 1,000 Units by mouth 2 (two) times daily.     Marland Kitchen esomeprazole (NEXIUM) 40 MG capsule TAKE 1 CAPSULE (40  MG TOTAL) BY MOUTH DAILY BEFORE BREAKFAST. 90 capsule 3  . lisinopril (PRINIVIL,ZESTRIL) 10 MG tablet Take 1 tablet (10 mg total) by mouth daily. 30 tablet 3  . metoprolol tartrate (LOPRESSOR) 25 MG tablet Take 0.5 tablets (12.5 mg total) by mouth 2 (two) times daily. 30 tablet 3  . Multiple Vitamin (MULTIVITAMIN) tablet Take 1 tablet by mouth daily.     No current facility-administered medications for this visit.    Allergies  Allergen Reactions  . Omeprazole     REACTION: not effective  . Ranitidine Hcl     REACTION: not effective  . Codeine Nausea And Vomiting    REACTION: vomiting    Review of Systems  Improving appetite Improving sleep habits Minimal postsurgical pain  BP 130/77 mmHg  Pulse 88  Resp 16  Ht 5\' 8"  (1.727 m)   Wt 160 lb (72.576 kg)  BMI 24.33 kg/m2  SpO2 97% Physical Exam Alert and comfortable Sternal incision is well-healed and stable, the skin over the anterior chest is dry Heart rhythm is regular, faint flow murmur through aortic valve without diastolic murmur Abdomen is soft No pedal edema Neuro intact  Diagnostic Tests: Lungs clear cardiac silhouette stable  Impression: Doing well after aVR for aortic stenosis. She will start  driving and increase activity level as we discussed. She without lift more than 20 pounds until 3 months after the date of surgery She will continue her current medications under the direction of her primary care physician and Dr. Farrel Conners Plan: Return as needed   Len Childs, MD [facility] [phone]

## 2015-09-16 ENCOUNTER — Encounter: Payer: Medicare Other | Admitting: *Deleted

## 2015-09-16 DIAGNOSIS — Z952 Presence of prosthetic heart valve: Secondary | ICD-10-CM | POA: Diagnosis not present

## 2015-09-16 NOTE — Progress Notes (Signed)
Daily Session Note  Patient Details  Name: SHARMON CHERAMIE MRN: [id number] Date of Birth: December 03, 1945 Referring Provider:        Cardiac Rehab from 09/12/2015 in [facility] Cardiac and Pulmonary Rehab   Referring Provider  [name]      Encounter Date: 09/16/2015  Check In:     Session Check In - 09/16/15 6282    Check-In   Staff Present Heath Lark, RN, BSN, CCRP;Rebecca Sickles, DPT, CEEA;Carroll Enterkin, RN, BSN   Supervising physician immediately available to respond to emergencies See telemetry face sheet for immediately available ER MD   Medication changes reported     No   Fall or balance concerns reported    No   Warm-up and Cool-down Performed on first and last piece of equipment   Resistance Training Performed Yes   VAD Patient? No   Pain Assessment   Currently in Pain? No/denies         Goals Met:  Exercise tolerated well No report of cardiac concerns or symptoms Strength training completed today  Goals Unmet:  Not Applicable  Comments: Doing well with exercise prescription progression.    Dr. Emily Filbert is Medical Director for [place] and [facility].

## 2015-09-19 ENCOUNTER — Encounter: Payer: Medicare Other | Attending: Cardiothoracic Surgery | Admitting: *Deleted

## 2015-09-19 DIAGNOSIS — Z952 Presence of prosthetic heart valve: Secondary | ICD-10-CM

## 2015-09-19 NOTE — Progress Notes (Signed)
Daily Session Note  Patient Details  Name: VIVIAN NEUWIRTH MRN: [id number] Date of Birth: 1945-10-12 Referring Provider:        Cardiac Rehab from 09/12/2015 in [facility] Cardiac and Pulmonary Rehab   Referring Provider  [name]      Encounter Date: 09/19/2015  Check In:     Session Check In - 09/19/15 0843    Check-In   Staff Present Nyoka Cowden, RN, BSN, MA;Susanne Bice, RN, BSN, Laveda Norman, BS, ACSM CEP, Exercise Physiologist   Supervising physician immediately available to respond to emergencies See telemetry face sheet for immediately available ER MD   Medication changes reported     No   Fall or balance concerns reported    No   Warm-up and Cool-down Performed on first and last piece of equipment   Resistance Training Performed Yes   VAD Patient? No   Pain Assessment   Currently in Pain? No/denies         Goals Met:  Exercise tolerated well No report of cardiac concerns or symptoms Strength training completed today  Goals Unmet:  Not Applicable  Comments: Doing well with exercise prescription progression.    Dr. Emily Filbert is Medical Director for [place] and [facility].

## 2015-09-21 ENCOUNTER — Encounter: Payer: Medicare Other | Admitting: *Deleted

## 2015-09-21 DIAGNOSIS — Z952 Presence of prosthetic heart valve: Secondary | ICD-10-CM

## 2015-09-21 NOTE — Progress Notes (Signed)
Daily Session Note  Patient Details  Name: Heidi Middleton MRN: [id number] Date of Birth: 04-Sep-1945 Referring Provider:        Cardiac Rehab from 09/12/2015 in [facility] Cardiac and Pulmonary Rehab   Referring Provider  [name]      Encounter Date: 09/21/2015  Check In:     Session Check In - 09/21/15 0749    Check-In   Location [facility]-Cardiac & Pulmonary Rehab   Staff Present Gerlene Burdock, RN, BSN;Jessica Luan Pulling, MA, ACSM RCEP, Exercise Physiologist;Amanda Oletta Darter, BA, ACSM CEP, Exercise Physiologist   Supervising physician immediately available to respond to emergencies See telemetry face sheet for immediately available ER MD   Medication changes reported     No   Fall or balance concerns reported    No   Warm-up and Cool-down Performed on first and last piece of equipment   Resistance Training Performed Yes   VAD Patient? No   Pain Assessment   Currently in Pain? No/denies   Multiple Pain Sites No         Goals Met:  Independence with exercise equipment Exercise tolerated well No report of cardiac concerns or symptoms Strength training completed today  Goals Unmet:  Not Applicable  Comments: Pt able to follow exercise prescription today without complaint.  Will continue to monitor for progression.    Dr. Emily Filbert is Medical Director for [place] and [facility].

## 2015-09-23 ENCOUNTER — Encounter: Payer: Medicare Other | Admitting: *Deleted

## 2015-09-23 DIAGNOSIS — Z952 Presence of prosthetic heart valve: Secondary | ICD-10-CM

## 2015-09-23 NOTE — Progress Notes (Signed)
Daily Session Note  Patient Details  Name: Heidi Middleton MRN: [id number] Date of Birth: [birth year] Referring Provider:        Cardiac Rehab from 09/12/2015 in [facility] Cardiac and Pulmonary Rehab   Referring Provider  [name]      Encounter Date: 09/23/2015  Check In:     Session Check In - 09/23/15 0925    Check-In   Location [facility]-Cardiac & Pulmonary Rehab   Staff Present Nyoka Cowden, RN, BSN, MA;Carroll Enterkin, RN, Levie Heritage, MA, ACSM RCEP, Exercise Physiologist   Supervising physician immediately available to respond to emergencies See telemetry face sheet for immediately available ER MD   Medication changes reported     No   Fall or balance concerns reported    No   Warm-up and Cool-down Performed on first and last piece of equipment   Resistance Training Performed Yes   VAD Patient? No   Pain Assessment   Currently in Pain? No/denies   Multiple Pain Sites No         Goals Met:  Independence with exercise equipment Exercise tolerated well No report of cardiac concerns or symptoms Strength training completed today  Goals Unmet:  Not Applicable  Comments: Pt able to follow exercise prescription today without complaint.  Will continue to monitor for progression. Tried new rotation today with 15 min stations.    Dr. Emily Filbert is Medical Director for [place] and [facility].

## 2015-09-26 ENCOUNTER — Encounter: Payer: Medicare Other | Admitting: *Deleted

## 2015-09-26 DIAGNOSIS — Z952 Presence of prosthetic heart valve: Secondary | ICD-10-CM

## 2015-09-26 NOTE — Progress Notes (Signed)
Daily Session Note  Patient Details  Name: Heidi Middleton MRN: [id number] Date of Birth: Apr 22, 1945 Referring Provider:        Cardiac Rehab from 09/12/2015 in [facility] Cardiac and Pulmonary Rehab   Referring Provider  [name]      Encounter Date: 09/26/2015  Check In:     Session Check In - 09/26/15 0819    Check-In   Location [facility]-Cardiac & Pulmonary Rehab   Staff Present Heath Lark, RN, BSN, Laveda Norman, BS, ACSM CEP, Exercise Physiologist;Jessica [place], [place], ACSM RCEP, Exercise Physiologist   Supervising physician immediately available to respond to emergencies See telemetry face sheet for immediately available ER MD   Medication changes reported     No   Fall or balance concerns reported    No   Warm-up and Cool-down Performed on first and last piece of equipment   Resistance Training Performed Yes   VAD Patient? No   Pain Assessment   Currently in Pain? No/denies   Multiple Pain Sites No         Goals Met:  Proper associated with RPD/PD & O2 Sat Independence with exercise equipment Personal goals reviewed No report of cardiac concerns or symptoms Strength training completed today  Goals Unmet:  Not Applicable  Comments: Patient completed exercise prescription and all exercise goals during rehab session. The exercise was tolerated well and the patient is progressing in the program.     Dr. Emily Filbert is Medical Director for [place] and [facility].

## 2015-09-26 NOTE — Progress Notes (Signed)
Daily Session Note  Patient Details  Name: Heidi Middleton MRN: [id number] Date of Birth: [birth year] Referring Provider:        Cardiac Rehab from 09/12/2015 in [facility] Cardiac and Pulmonary Rehab   Referring Provider  [name]      Encounter Date: 09/26/2015  Check In:     Session Check In - 09/26/15 0819    Check-In   Location [facility]-Cardiac & Pulmonary Rehab   Staff Present Heath Lark, RN, BSN, Laveda Norman, BS, ACSM CEP, Exercise Physiologist;Jessica [place], [place], ACSM RCEP, Exercise Physiologist   Supervising physician immediately available to respond to emergencies See telemetry face sheet for immediately available ER MD   Medication changes reported     No   Fall or balance concerns reported    No   Warm-up and Cool-down Performed on first and last piece of equipment   Resistance Training Performed Yes   VAD Patient? No   Pain Assessment   Currently in Pain? No/denies   Multiple Pain Sites No         Goals Met:  Exercise tolerated well No report of cardiac concerns or symptoms  Goals Unmet:  Not Applicable  Comments: Doing well with exercise prescription progression.    Dr. Emily Filbert is Medical Director for [place] and [facility].

## 2015-09-28 DIAGNOSIS — Z952 Presence of prosthetic heart valve: Secondary | ICD-10-CM

## 2015-09-28 NOTE — Progress Notes (Signed)
Daily Session Note  Patient Details  Name: Heidi Middleton MRN: [id number] Date of Birth: 03-Aug-1945 Referring Provider:        Cardiac Rehab from 09/12/2015 in [facility] Cardiac and Pulmonary Rehab   Referring Provider  [name]      Encounter Date: 09/28/2015  Check In:     Session Check In - 09/28/15 0905    Check-In   Location [facility]-Cardiac & Pulmonary Rehab   Staff Present Gerlene Burdock, RN, BSN;Jessica Luan Pulling, MA, ACSM RCEP, Exercise Physiologist;Sarahlynn Cisnero Oletta Darter, BA, ACSM CEP, Exercise Physiologist   Supervising physician immediately available to respond to emergencies See telemetry face sheet for immediately available ER MD   Medication changes reported     No   Fall or balance concerns reported    No   Warm-up and Cool-down Performed on first and last piece of equipment   Resistance Training Performed Yes   VAD Patient? No   Pain Assessment   Currently in Pain? No/denies   Multiple Pain Sites No         Goals Met:  Independence with exercise equipment Exercise tolerated well No report of cardiac concerns or symptoms Strength training completed today  Goals Unmet:  Not Applicable  Comments: Pt able to follow exercise prescription today without complaint.  Will continue to monitor for progression.    Dr. Emily Filbert is Medical Director for [place] and [facility].

## 2015-09-30 ENCOUNTER — Encounter: Payer: Medicare Other | Admitting: *Deleted

## 2015-09-30 DIAGNOSIS — Z952 Presence of prosthetic heart valve: Secondary | ICD-10-CM | POA: Diagnosis not present

## 2015-09-30 NOTE — Progress Notes (Signed)
Daily Session Note  Patient Details  Name: Heidi Middleton MRN: [id number] Date of Birth: Nov 25, 1945 Referring Provider:        Cardiac Rehab from 09/12/2015 in [facility] Cardiac and Pulmonary Rehab   Referring Provider  [name]      Encounter Date: 09/30/2015  Check In:     Session Check In - 09/30/15 0949    Check-In   Location [facility]-Cardiac & Pulmonary Rehab   Staff Present Nyoka Cowden, RN, BSN, MA;Oren Barella, RN, Levie Heritage, MA, ACSM RCEP, Exercise Physiologist   Supervising physician immediately available to respond to emergencies See telemetry face sheet for immediately available ER MD   Medication changes reported     No   Fall or balance concerns reported    No   Warm-up and Cool-down Performed on first and last piece of equipment   Resistance Training Performed Yes   VAD Patient? No   Pain Assessment   Currently in Pain? No/denies         Goals Met:  Proper associated with RPD/PD & O2 Sat Exercise tolerated well  Goals Unmet:  Not Applicable  Comments:     Dr. Emily Filbert is Medical Director for [place] and [facility].

## 2015-09-30 NOTE — Progress Notes (Signed)
Daily Session Note  Patient Details  Name: Heidi Middleton MRN: [id number] Date of Birth: Feb 10, 1946 Referring Provider:        Cardiac Rehab from 09/12/2015 in [facility] Cardiac and Pulmonary Rehab   Referring Provider  [name]      Encounter Date: 09/30/2015  Check In:     Session Check In - 09/30/15 0940    Check-In   Location [facility]-Cardiac & Pulmonary Rehab   Staff Present Gerlene Burdock, RN, BSN;Mary Kellie Shropshire, RN, BSN, Willette Pa, MA, ACSM RCEP, Exercise Physiologist   Supervising physician immediately available to respond to emergencies See telemetry face sheet for immediately available ER MD   Medication changes reported     No   Fall or balance concerns reported    No   Warm-up and Cool-down Performed on first and last piece of equipment   Resistance Training Performed Yes   VAD Patient? No   Pain Assessment   Currently in Pain? No/denies   Multiple Pain Sites No         Goals Met:  Independence with exercise equipment Exercise tolerated well No report of cardiac concerns or symptoms Strength training completed today  Goals Unmet:  Not Applicable  Comments: Pt able to follow exercise prescription today without complaint.  Will continue to monitor for progression.    Dr. Emily Filbert is Medical Director for [place] and [facility].

## 2015-10-03 ENCOUNTER — Encounter: Payer: Medicare Other | Admitting: *Deleted

## 2015-10-03 DIAGNOSIS — Z952 Presence of prosthetic heart valve: Secondary | ICD-10-CM | POA: Diagnosis not present

## 2015-10-03 NOTE — Progress Notes (Signed)
Daily Session Note  Patient Details  Name: Heidi Middleton MRN: [id number] Date of Birth: 01-23-1946 Referring Provider:        Cardiac Rehab from 09/12/2015 in [facility] Cardiac and Pulmonary Rehab   Referring Provider  [name]      Encounter Date: 10/03/2015  Check In:     Session Check In - 10/03/15 0826    Check-In   Location [facility]-Cardiac & Pulmonary Rehab   Staff Present Heath Lark, RN, BSN, Laveda Norman, BS, ACSM CEP, Exercise Physiologist;Jessica [place], [place], ACSM RCEP, Exercise Physiologist   Supervising physician immediately available to respond to emergencies See telemetry face sheet for immediately available ER MD   Medication changes reported     No   Fall or balance concerns reported    No   Warm-up and Cool-down Performed on first and last piece of equipment   Resistance Training Performed Yes   VAD Patient? No   Pain Assessment   Currently in Pain? No/denies   Multiple Pain Sites No         Goals Met:  Independence with exercise equipment Exercise tolerated well No report of cardiac concerns or symptoms Strength training completed today  Goals Unmet:  Not Applicable  Comments: Patient completed exercise prescription and all exercise goals during rehab session. The exercise was tolerated well and the patient is progressing in the program.     Dr. Emily Filbert is Medical Director for [place] and [facility].

## 2015-10-05 ENCOUNTER — Ambulatory Visit (INDEPENDENT_AMBULATORY_CARE_PROVIDER_SITE_OTHER): Payer: Medicare Other

## 2015-10-05 ENCOUNTER — Other Ambulatory Visit: Payer: Self-pay

## 2015-10-05 ENCOUNTER — Encounter: Payer: Self-pay | Admitting: *Deleted

## 2015-10-05 DIAGNOSIS — Z954 Presence of other heart-valve replacement: Secondary | ICD-10-CM

## 2015-10-05 DIAGNOSIS — Z952 Presence of prosthetic heart valve: Secondary | ICD-10-CM

## 2015-10-05 NOTE — Progress Notes (Signed)
Cardiac Individual Treatment Plan  Patient Details  Name: KIMBELLA MINCHIN MRN: [id number] Date of Birth: 1945-05-18 Referring Provider:        Cardiac Rehab from 09/12/2015 in [facility] Cardiac and Pulmonary Rehab   Referring Provider  [name]      Initial Encounter Date:       Cardiac Rehab from 09/12/2015 in [facility] Cardiac and Pulmonary Rehab   Date  09/12/15   Referring Provider  [name]      Visit Diagnosis: S/P aortic valve replacement  Patient's Home Medications on Admission:  Current outpatient prescriptions:  .  acetaminophen (TYLENOL) 500 MG tablet, Take 2 tablets (1,000 mg total) by mouth every 6 (six) hours as needed for mild pain or fever., Disp: 30 tablet, Rfl: 0 .  aspirin EC 325 MG EC tablet, Take 1 tablet (325 mg total) by mouth daily., Disp: 30 tablet, Rfl: 0 .  atorvastatin (LIPITOR) 10 MG tablet, TAKE 0.5 TABLETS (5 MG TOTAL) BY MOUTH DAILY., Disp: 45 tablet, Rfl: 3 .  cholecalciferol (VITAMIN D) 1000 UNITS tablet, Take 1,000 Units by mouth 2 (two) times daily. , Disp: , Rfl:  .  esomeprazole (NEXIUM) 40 MG capsule, TAKE 1 CAPSULE (40 MG TOTAL) BY MOUTH DAILY BEFORE BREAKFAST., Disp: 90 capsule, Rfl: 3 .  lisinopril (PRINIVIL,ZESTRIL) 10 MG tablet, Take 1 tablet (10 mg total) by mouth daily., Disp: 30 tablet, Rfl: 3 .  metoprolol tartrate (LOPRESSOR) 25 MG tablet, Take 0.5 tablets (12.5 mg total) by mouth 2 (two) times daily., Disp: 30 tablet, Rfl: 3 .  Multiple Vitamin (MULTIVITAMIN) tablet, Take 1 tablet by mouth daily., Disp: , Rfl:   Past Medical History: Past Medical History  Diagnosis Date  . Colonic polyp   . Hypertension   . Aortic stenosis   . GERD (gastroesophageal reflux disease)   . Headache     migraines prior to hysterectomy  . PONV (postoperative nausea and vomiting)     nausea  . Shingles     Tobacco Use: History  Smoking status  . Former Smoker  . Quit date: 07/07/2002  Smokeless tobacco  . Never Used    Labs: Recent Review  Flowsheet Data    Labs for ITP Cardiac and Pulmonary Rehab Latest Ref Rng 08/16/2015 08/16/2015 08/16/2015 08/17/2015 08/17/2015   PHART 7.350 - 7.450 - 7.336(L) 7.283(L) - -   PCO2ART 35.0 - 45.0 mmHg - 41.9 47.7(H) - -   HCO3 20.0 - 24.0 mEq/L - 22.7 22.8 - -   TCO2 0 - 100 mmol/L 22 24 24 16 24    ACIDBASEDEF 0.0 - 2.0 mmol/L - 3.0(H) 4.0(H) - -   O2SAT - - 98.0 96.0 - -       Exercise Target Goals:    Exercise Program Goal: Individual exercise prescription set with THRR, safety & activity barriers. Participant demonstrates ability to understand and report RPE using BORG scale, to self-measure pulse accurately, and to acknowledge the importance of the exercise prescription.  Exercise Prescription Goal: Starting with aerobic activity 30 plus minutes a day, 3 days per week for initial exercise prescription. Provide home exercise prescription and guidelines that participant acknowledges understanding prior to discharge.  Activity Barriers & Risk Stratification:     Activity Barriers & Cardiac Risk Stratification - 09/12/15 1250    Activity Barriers & Cardiac Risk Stratification   Activity Barriers None   Cardiac Risk Stratification High      6 Minute Walk:     6 Minute Walk  09/12/15 1251       6 Minute Walk   Distance 1365 feet     Walk Time 6 minutes     # of Rest Breaks 0     MPH 2.6     METS 3.6     RPE 11     VO2 Peak 12.7     Symptoms No     Resting HR 88 bpm     Resting BP 132/70 mmHg     Max Ex. HR 126 bpm     Max Ex. BP 154/68 mmHg        Initial Exercise Prescription:     Initial Exercise Prescription - 09/12/15 1200    Date of Initial Exercise RX and Referring Provider   Date 09/12/15   Referring Provider [name]   Treadmill   MPH 2.5   Grade 1   Minutes 15   METs 3.26   Recumbant Bike   Level 1   RPM 50   Watts 25   Minutes 15   METs 3.12   NuStep   Level 2   Watts 50   Minutes 15   METs 3.4   REL-XR   Level 2   Watts 50    Minutes 15   METs 3.4   T5 Nustep   Level 1   Watts 50   Minutes 15   METs 3.4   Biostep-RELP   Level 2   Watts 50   Minutes 15   METs 3.4   Prescription Details   Frequency (times per week) 3   Duration Progress to 30 minutes of continuous aerobic without signs/symptoms of physical distress   Intensity   THRR 40-80% of Max Heartrate 112-137   Ratings of Perceived Exertion 11-13   Perceived Dyspnea 0-4   Progression   Progression Continue to progress workloads to maintain intensity without signs/symptoms of physical distress.   Resistance Training   Training Prescription Yes   Weight 2   Reps 10-15      Perform Capillary Blood Glucose checks as needed.  Exercise Prescription Changes:     Exercise Prescription Changes      09/15/15 0800 09/28/15 1500         Exercise Review   Progression No Yes      Response to Exercise   Blood Pressure (Admit) 124/64 mmHg 132/66 mmHg      Blood Pressure (Exercise) 138/62 mmHg 130/60 mmHg      Blood Pressure (Exit) 124/68 mmHg 112/70 mmHg      Heart Rate (Admit) 77 bpm 53 bpm      Heart Rate (Exercise) 88 bpm 106 bpm      Heart Rate (Exit) 78 bpm 88 bpm      Rating of Perceived Exertion (Exercise)  13      Symptoms  none      Duration  Progress to 45 minutes of aerobic exercise without signs/symptoms of physical distress      Intensity  THRR unchanged      Progression   Progression Continue to progress workloads to maintain intensity without signs/symptoms of physical distress. Continue to progress workloads to maintain intensity without signs/symptoms of physical distress.      Average METs  3.84      Resistance Training   Training Prescription Yes Yes      Weight 3 3      Reps 10-15 10-15      Interval Training   Interval Training  No  Treadmill   MPH  2.7      Grade  1.5      Minutes  15      METs  3.53      NuStep   Level 2 4      Watts 25 --  3.3 METs      Minutes 27 15      METs 2.1 3.3      REL-XR    Level  4      Minutes  15      METs  4.3         Exercise Comments:     Exercise Comments      09/23/15 0926 09/28/15 1534         Exercise Comments Tried new rotation today with 15 min stations.  Tried XR for first time today with success! Valois is off to a good start with exercise.  She is already starting to notice a difference in being able to do more around the house.  We will continue to work with her on making increases in her progression.  Today she was able to bump up her treadmill.         Discharge Exercise Prescription (Final Exercise Prescription Changes):     Exercise Prescription Changes - 09/28/15 1500    Exercise Review   Progression Yes   Response to Exercise   Blood Pressure (Admit) 132/66 mmHg   Blood Pressure (Exercise) 130/60 mmHg   Blood Pressure (Exit) 112/70 mmHg   Heart Rate (Admit) 53 bpm   Heart Rate (Exercise) 106 bpm   Heart Rate (Exit) 88 bpm   Rating of Perceived Exertion (Exercise) 13   Symptoms none   Duration Progress to 45 minutes of aerobic exercise without signs/symptoms of physical distress   Intensity THRR unchanged   Progression   Progression Continue to progress workloads to maintain intensity without signs/symptoms of physical distress.   Average METs 3.84   Resistance Training   Training Prescription Yes   Weight 3   Reps 10-15   Interval Training   Interval Training No   Treadmill   MPH 2.7   Grade 1.5   Minutes 15   METs 3.53   NuStep   Level 4   Watts --  3.3 METs   Minutes 15   METs 3.3   REL-XR   Level 4   Minutes 15   METs 4.3      Nutrition:  Target Goals: Understanding of nutrition guidelines, daily intake of sodium 1500mg , cholesterol 200mg , calories 30% from fat and 7% or less from saturated fats, daily to have 5 or more servings of fruits and vegetables.  Biometrics:     Pre Biometrics - 09/12/15 1250    Pre Biometrics   Height 5\' 8"  (1.727 m)   Weight 160 lb 4.8 oz (72.712 kg)   Waist  Circumference 33.13 inches   Hip Circumference 41.25 inches   Waist to Hip Ratio 0.8 %   BMI (Calculated) 24.4       Nutrition Therapy Plan and Nutrition Goals:     Nutrition Therapy & Goals - 09/19/15 1032    Nutrition Therapy   Diet Instructed on  heart healthy ways to increase calories to prevent further weight loss. Placed emphasis on adequate protein. Patient has a weight goal of 165 lbs.     Drug/Food Interactions Statins/Certain Fruits   Protein (specify units) 8   Fiber 20 grams   Whole Grain Foods 3  servings   Saturated Fats 12 max. grams   Fruits and Vegetables 5 servings/day   Sodium 1500 grams   Personal Nutrition Goals   Personal Goal #1 Include 2-3 small snacks in addition to meals such as peanut butter with crackers or apple or pecans (patient has these available).   Personal Goal #2 Include a protein source at all meals with goal of 8 oz/day of protein or protein equivalent (Refer to list given).   Personal Goal #3 Consider adding a trans-free margarine to any appropriate foods to help increase calories.   Intervention Plan   Intervention Prescribe, educate and counsel regarding individualized specific dietary modifications aiming towards targeted core components such as weight, hypertension, lipid management, diabetes, heart failure and other comorbidities.;Nutrition handout(s) given to patient.   Expected Outcomes Short Term Goal: Understand basic principles of dietary content, such as calories, fat, sodium, cholesterol and nutrients.;Short Term Goal: A plan has been developed with personal nutrition goals set during dietitian appointment.;Long Term Goal: Adherence to prescribed nutrition plan.      Nutrition Discharge: Rate Your Plate Scores:     Nutrition Assessments - 09/21/15 0937    Rate Your Plate Scores   Pre Score 74   Pre Score % 82 %      Nutrition Goals Re-Evaluation:   Psychosocial: Target Goals: Acknowledge presence or absence of depression,  maximize coping skills, provide positive support system. Participant is able to verbalize types and ability to use techniques and skills needed for reducing stress and depression.  Initial Review & Psychosocial Screening:     Initial Psych Review & Screening - 09/12/15 1545    Initial Review   Current issues with Current Sleep Concerns;Current Stress Concerns   Source of Stress Concerns Unable to participate in former interests or hobbies   Comments Patient states she was very active prior to surgery, as she is the caregiver for her husband.  Prior to surgery she was able to garden, exercise 4 x /week, mow the yard, and do the weed eating in and around her home.  She states she is bored.     Family Dynamics   Good Support System? No   Strains Illness and family care strain   Concerns No support system   Comments Upon asking Layza about good family support she stated, "no!"  She is the caregiver for her husband and she feels like he could do more for himself, but he refuses.  Also, she was keeping her grandchildren every morning from 6 a.m. to 8 a.m. until the school / day care opened.     Barriers   Psychosocial barriers to participate in program The patient should benefit from training in stress management and relaxation.   Screening Interventions   Interventions Program counselor consult;Encouraged to exercise      Quality of Life Scores:     Quality of Life - 09/12/15 1550    Quality of Life Scores   Health/Function Pre 21.68 %   Socioeconomic Pre 27.92 %   Psych/Spiritual Pre 21.36 %   Family Pre 19 %   GLOBAL Pre 22.36 %      PHQ-9:     Recent Review Flowsheet Data    Depression screen [facility] 2/9 09/12/2015 06/20/2015 06/21/2014 06/15/2013 06/13/2012   Decreased Interest 1 0 0 0 0   Down, Depressed, Hopeless 1 0 0 0 0   PHQ - 2 Score 2 0 0 0 0   Altered sleeping 3 - - - -  Tired, decreased energy 2 - - - -   Change in appetite 3 - - - -   Feeling bad or failure about  yourself  1 - - - -   Trouble concentrating 3 - - - -   Moving slowly or fidgety/restless 0 - - - -   Suicidal thoughts 0 - - - -   PHQ-9 Score 14 - - - -   Difficult doing work/chores Somewhat difficult  - - - -      Psychosocial Evaluation and Intervention:     Psychosocial Evaluation - 09/26/15 0959    Psychosocial Evaluation & Interventions   Continued Psychosocial Services Needed Yes  Ms. Duhe will benefit from the psychoeducational components of this program - especially stress management and depression.        Psychosocial Re-Evaluation:   Vocational Rehabilitation: Provide vocational rehab assistance to qualifying candidates.   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        Education: Education Goals: Education classes will be provided on a weekly basis, covering required topics. Participant will state understanding/return demonstration of topics presented.  Learning Barriers/Preferences:     Learning Barriers/Preferences - 09/12/15 1250    Learning Barriers/Preferences   Learning Barriers None   Learning Preferences None      Education Topics: General Nutrition Guidelines/Fats and Fiber: -Group instruction provided by verbal, written material, models and posters to present the general guidelines for heart healthy nutrition. Gives an explanation and review of dietary fats and fiber.   Controlling Sodium/Reading Food Labels: -Group verbal and written material supporting the discussion of sodium use in heart healthy nutrition. Review and explanation with models, verbal and written materials for utilization of the food label.   Exercise Physiology & Risk Factors: - Group verbal and written instruction with models to review the exercise physiology of  the cardiovascular system and associated critical values. Details cardiovascular disease risk factors and the goals associated with each risk factor.   Aerobic Exercise & Resistance Training: - Gives group verbal and written discussion on the health impact of inactivity. On the components of aerobic and resistive training programs and the benefits of this training and how to safely progress through these programs.   Flexibility, Balance, General Exercise Guidelines: - Provides group verbal and written instruction on the benefits of flexibility and balance training programs. Provides general exercise guidelines with specific guidelines to those with heart or lung disease. Demonstration and skill practice provided.   Stress Management: - Provides group verbal and written instruction about the health risks of elevated stress, cause of high stress, and healthy ways to reduce stress.          Cardiac Rehab from 10/03/2015 in [facility] Cardiac and Pulmonary Rehab   Date  09/14/15   Azalia Bilis, MSW   Instruction Review Code  2- meets goals/outcomes      Depression: - Provides group verbal and written instruction on the correlation between heart/lung disease and depressed mood, treatment options, and the stigmas associated with seeking treatment.   Anatomy & Physiology of the Heart: - Group verbal and written instruction and models provide basic cardiac anatomy and physiology, with the coronary electrical and arterial systems. Review of: AMI, Angina, Valve disease, Heart Failure, Cardiac Arrhythmia, Pacemakers, and the ICD.  Cardiac Procedures: - Group verbal and written instruction and models to describe the testing methods done to diagnose heart disease. Reviews the outcomes of the test results. Describes the treatment choices: Medical Management, Angioplasty, or Coronary Bypass Surgery.      Cardiac Rehab from 10/03/2015 in [facility] Cardiac and Pulmonary Rehab   Date  09/21/15   Educator   CE   Instruction Review Code  2- meets goals/outcomes      Cardiac Medications: - Group verbal and written instruction to review commonly prescribed medications for heart disease. Reviews the medication, class of the drug, and side effects. Includes the steps to properly store meds and maintain the prescription regimen.      Cardiac Rehab from 10/03/2015 in [facility] Cardiac and Pulmonary Rehab   Date  10/03/15   Educator  SB   Instruction Review Code  2- meets goals/outcomes [cardiac meds lecture 2]      Go Sex-Intimacy & Heart Disease, Get SMART - Goal Setting: - Group verbal and written instruction through game format to discuss heart disease and the return to sexual intimacy. Provides group verbal and written material to discuss and apply goal setting through the application of the S.M.A.R.T. Method.      Cardiac Rehab from 10/03/2015 in [facility] Cardiac and Pulmonary Rehab   Date  09/21/15   Educator  CE   Instruction Review Code  2- meets goals/outcomes      Other Matters of the Heart: - Provides group verbal, written materials and models to describe Heart Failure, Angina, Valve Disease, and Diabetes in the realm of heart disease. Includes description of the disease process and treatment options available to the cardiac patient.   Exercise & Equipment Safety: - Individual verbal instruction and demonstration of equipment use and safety with use of the equipment.      Cardiac Rehab from 10/03/2015 in [facility] Cardiac and Pulmonary Rehab   Date  09/14/15   Educator  C. EnterkinRN   Instruction Review Code  1- partially meets, needs review/practice      Infection Prevention: - Provides verbal and written material to individual with discussion of infection control including proper hand washing and proper equipment cleaning during exercise session.      Cardiac Rehab from 10/03/2015 in [facility] Cardiac and Pulmonary Rehab   Date  09/12/15   Educator  DW   Instruction Review Code  2- meets  goals/outcomes      Falls Prevention: - Provides verbal and written material to individual with discussion of falls prevention and safety.      Cardiac Rehab from 10/03/2015 in [facility] Cardiac and Pulmonary Rehab   Date  09/12/15   Educator  DW   Instruction Review Code  2- meets goals/outcomes      Diabetes: - Individual verbal and written instruction to review signs/symptoms of diabetes, desired ranges of glucose level fasting, after meals and with exercise. Advice that pre and post exercise glucose checks will be done for 3 sessions at entry of program.    Knowledge Questionnaire Score:     Knowledge Questionnaire Score - 09/12/15 1931    Knowledge Questionnaire Score   Pre Score 25/28      Core Components/Risk Factors/Patient Goals at Admission:     Personal Goals and Risk Factors at Admission - 09/12/15 1254    Core Components/Risk Factors/Patient Goals on Admission    Weight Management Weight Maintenance   Admit Weight 160 lb 3 oz (72.661 kg)   Expected Outcomes Weight Maintenance: Understanding  of the daily nutrition guidelines, which includes 25-35% calories from fat, 7% or less cal from saturated fats, less than 200mg  cholesterol, less than 1.5gm of sodium, & 5 or more servings of fruits and vegetables daily   Sedentary Yes   Intervention Provide advice, education, support and counseling about physical activity/exercise needs.;Develop an individualized exercise prescription for aerobic and resistive training based on initial evaluation findings, risk stratification, comorbidities and participant's personal goals.   Expected Outcomes Achievement of increased cardiorespiratory fitness and enhanced flexibility, muscular endurance and strength shown through measurements of functional capacity and personal statement of participant.   Increase Strength and Stamina Yes   Intervention Provide advice, education, support and counseling about physical activity/exercise needs.;Develop  an individualized exercise prescription for aerobic and resistive training based on initial evaluation findings, risk stratification, comorbidities and participant's personal goals.   Expected Outcomes Achievement of increased cardiorespiratory fitness and enhanced flexibility, muscular endurance and strength shown through measurements of functional capacity and personal statement of participant.   Hypertension Yes   Intervention Provide education on lifestyle modifcations including regular physical activity/exercise, weight management, moderate sodium restriction and increased consumption of fresh fruit, vegetables, and low fat dairy, alcohol moderation, and smoking cessation.;Monitor prescription use compliance.   Expected Outcomes Short Term: Continued assessment and intervention until BP is < 140/22mm HG in hypertensive participants. < 130/53mm HG in hypertensive participants with diabetes, heart failure or chronic kidney disease.;Long Term: Maintenance of blood pressure at goal levels.   Lipids Yes   Intervention Provide education and support for participant on nutrition & aerobic/resistive exercise along with prescribed medications to achieve LDL 70mg , HDL >40mg .   Expected Outcomes Short Term: Participant states understanding of desired cholesterol values and is compliant with medications prescribed. Participant is following exercise prescription and nutrition guidelines.;Long Term: Cholesterol controlled with medications as prescribed, with individualized exercise RX and with personalized nutrition plan. Value goals: LDL < 70mg , HDL > 40 mg.   Stress Yes   Intervention Offer individual and/or small group education and counseling on adjustment to heart disease, stress management and health-related lifestyle change. Teach and support self-help strategies.;Refer participants experiencing significant psychosocial distress to appropriate mental health specialists for further evaluation and treatment.  When possible, include family members and significant others in education/counseling sessions.   Expected Outcomes Short Term: Participant demonstrates changes in health-related behavior, relaxation and other stress management skills, ability to obtain effective social support, and compliance with psychotropic medications if prescribed.;Long Term: Emotional wellbeing is indicated by absence of clinically significant psychosocial distress or social isolation.      Core Components/Risk Factors/Patient Goals Review:      Goals and Risk Factor Review      09/28/15 0955           Core Components/Risk Factors/Patient Goals Review   Personal Goals Review Weight Management/Obesity;Sedentary;Increase Strength and Stamina;Lipids;Stress;Hypertension       Review Karinne is starting to feel better.  She is walking with her dog some and now able to do more housework.  Still has lifting restrictions.  Her stress now is that she is bored.  She misses keeping her great grandchildren in the morning.  She was encouraged to start back part time to see how she is able to tolerate it fatigue wise.  Her blood pressure ans lipids have all been doing great.  Her weight is stable and she is looking forward to getting her appetite back.       Expected Outcomes Matisha will continue to do well  with exercise and will hopefully be able to keep her great grands again.          Core Components/Risk Factors/Patient Goals at Discharge (Final Review):      Goals and Risk Factor Review - 09/28/15 0955    Core Components/Risk Factors/Patient Goals Review   Personal Goals Review Weight Management/Obesity;Sedentary;Increase Strength and Stamina;Lipids;Stress;Hypertension   Review Dominiqua is starting to feel better.  She is walking with her dog some and now able to do more housework.  Still has lifting restrictions.  Her stress now is that she is bored.  She misses keeping her great grandchildren in the morning.  She was encouraged  to start back part time to see how she is able to tolerate it fatigue wise.  Her blood pressure ans lipids have all been doing great.  Her weight is stable and she is looking forward to getting her appetite back.   Expected Outcomes Charnesha will continue to do well with exercise and will hopefully be able to keep her great grands again.      ITP Comments:     ITP Comments      10/05/15 0754           ITP Comments 30 day review. Continue with ITP          Comments:

## 2015-10-05 NOTE — Progress Notes (Signed)
Daily Session Note  Patient Details  Name: Heidi Middleton MRN: [id number] Date of Birth: [birth year] Referring Provider:        Cardiac Rehab from 09/12/2015 in [facility] Cardiac and Pulmonary Rehab   Referring Provider  [name]      Encounter Date: 10/05/2015  Check In:     Session Check In - 10/05/15 0832    Check-In   Location [facility]-Cardiac & Pulmonary Rehab   Staff Present Gerlene Burdock, RN, BSN;Jessica Luan Pulling, MA, ACSM RCEP, Exercise Physiologist;Darnetta Kesselman Oletta Darter, BA, ACSM CEP, Exercise Physiologist   Supervising physician immediately available to respond to emergencies See telemetry face sheet for immediately available ER MD   Medication changes reported     No   Fall or balance concerns reported    No   Warm-up and Cool-down Performed on first and last piece of equipment   Resistance Training Performed Yes   VAD Patient? No   Pain Assessment   Currently in Pain? No/denies   Multiple Pain Sites No         Goals Met:  Independence with exercise equipment Exercise tolerated well No report of cardiac concerns or symptoms Strength training completed today  Goals Unmet:  Not Applicable  Comments: Pt able to follow exercise prescription today without complaint.  Will continue to monitor for progression.    Dr. Emily Filbert is Medical Director for [place] and [facility].

## 2015-10-07 ENCOUNTER — Encounter: Payer: Medicare Other | Admitting: *Deleted

## 2015-10-07 DIAGNOSIS — Z952 Presence of prosthetic heart valve: Secondary | ICD-10-CM | POA: Diagnosis not present

## 2015-10-07 NOTE — Progress Notes (Signed)
Daily Session Note  Patient Details  Name: Heidi Middleton MRN: [id number] Date of Birth: 1946/02/01 Referring Provider:        Cardiac Rehab from 09/12/2015 in [facility] Cardiac and Pulmonary Rehab   Referring Provider  [name]      Encounter Date: 10/07/2015  Check In:     Session Check In - 10/07/15 0846    Check-In   Location [facility]-Cardiac & Pulmonary Rehab   Staff Present Alberteen Sam, MA, ACSM RCEP, Exercise Physiologist;Amanda Oletta Darter, BA, ACSM CEP, Exercise Physiologist;Carroll Enterkin, RN, BSN   Supervising physician immediately available to respond to emergencies See telemetry face sheet for immediately available ER MD   Medication changes reported     No   Fall or balance concerns reported    No   Warm-up and Cool-down Performed on first and last piece of equipment   Resistance Training Performed Yes   VAD Patient? No   Pain Assessment   Currently in Pain? No/denies   Multiple Pain Sites No         Goals Met:  Independence with exercise equipment Exercise tolerated well No report of cardiac concerns or symptoms Strength training completed today  Goals Unmet:  Not Applicable  Comments: Pt able to follow exercise prescription today without complaint.  Will continue to monitor for progression. Reviewed home exercise with pt today.  Pt plans to walk and return back to classes at [facility] for exercise.  Reviewed THR, pulse, RPE, sign and symptoms, and when to call 911 or MD.  Also discussed weather considerations and indoor options.  Pt voiced understanding. Alberteen Sam, MA, ACSM RCEP 10/07/2015 8:47 AM    Dr. Emily Filbert is Medical Director for [place] and [facility].

## 2015-10-10 ENCOUNTER — Encounter: Payer: Medicare Other | Admitting: *Deleted

## 2015-10-10 DIAGNOSIS — Z952 Presence of prosthetic heart valve: Secondary | ICD-10-CM

## 2015-10-10 NOTE — Progress Notes (Signed)
Daily Session Note  Patient Details  Name: Heidi Middleton MRN: [id number] Date of Birth: 01/19/1946 Referring Provider:   Flowsheet Row Cardiac Rehab from 09/12/2015 in [facility] Cardiac and Pulmonary Rehab  Referring Provider  [name]      Encounter Date: 10/10/2015  Check In:     Session Check In - 10/10/15 0909      Check-In   Location [facility]-Cardiac & Pulmonary Rehab   Staff Present Heath Lark, RN, BSN, Laveda Norman, BS, ACSM CEP, Exercise Physiologist;Jessica [place], [place], ACSM RCEP, Exercise Physiologist   Supervising physician immediately available to respond to emergencies See telemetry face sheet for immediately available ER MD   Medication changes reported     No   Fall or balance concerns reported    No   Warm-up and Cool-down Performed on first and last piece of equipment   Resistance Training Performed Yes   VAD Patient? No     Pain Assessment   Currently in Pain? No/denies   Multiple Pain Sites No         Goals Met:  Independence with exercise equipment Exercise tolerated well No report of cardiac concerns or symptoms Strength training completed today  Goals Unmet:  Not Applicable  Comments: Patient completed exercise prescription and all exercise goals during rehab session. The exercise was tolerated well and the patient is progressing in the program.     Dr. Emily Filbert is Medical Director for [place] and [facility].

## 2015-10-12 ENCOUNTER — Encounter: Payer: Medicare Other | Admitting: *Deleted

## 2015-10-12 DIAGNOSIS — Z952 Presence of prosthetic heart valve: Secondary | ICD-10-CM

## 2015-10-12 NOTE — Progress Notes (Signed)
Daily Session Note  Patient Details  Name: Heidi Middleton MRN: [id number] Date of Birth: 09-22-1945 Referring Provider:   Flowsheet Row Cardiac Rehab from 09/12/2015 in [facility] Cardiac and Pulmonary Rehab  Referring Provider  [name]      Encounter Date: 10/12/2015  Check In:     Session Check In - 10/12/15 0830      Check-In   Location [facility]-Cardiac & Pulmonary Rehab   Staff Present Alberteen Sam, MA, ACSM RCEP, Exercise Physiologist;Carroll Enterkin, RN, Vickki Hearing, BA, ACSM CEP, Exercise Physiologist   Supervising physician immediately available to respond to emergencies See telemetry face sheet for immediately available ER MD   Medication changes reported     No   Fall or balance concerns reported    No   Warm-up and Cool-down Performed on first and last piece of equipment   Resistance Training Performed Yes   VAD Patient? No     Pain Assessment   Currently in Pain? No/denies   Multiple Pain Sites No         Goals Met:  Independence with exercise equipment Exercise tolerated well No report of cardiac concerns or symptoms Strength training completed today  Goals Unmet:  Not Applicable  Comments: Pt able to follow exercise prescription today without complaint.  Will continue to monitor for progression.    Dr. Emily Filbert is Medical Director for [place] and [facility].

## 2015-10-14 ENCOUNTER — Encounter: Payer: Medicare Other | Admitting: *Deleted

## 2015-10-14 DIAGNOSIS — Z952 Presence of prosthetic heart valve: Secondary | ICD-10-CM | POA: Diagnosis not present

## 2015-10-14 NOTE — Progress Notes (Signed)
Daily Session Note  Patient Details  Name: CRYSTOL WALPOLE MRN: [id number] Date of Birth: 23-Dec-1945 Referring Provider:   Flowsheet Row Cardiac Rehab from 09/12/2015 in [facility] Cardiac and Pulmonary Rehab  Referring Provider  [name]      Encounter Date: 10/14/2015  Check In:     Session Check In - 10/14/15 1018      Check-In   Location [facility]-Cardiac & Pulmonary Rehab   Staff Present Gerlene Burdock, RN, BSN;Mary Kellie Shropshire, RN, BSN, Willette Pa, MA, ACSM RCEP, Exercise Physiologist   Supervising physician immediately available to respond to emergencies See telemetry face sheet for immediately available ER MD   Medication changes reported     No   Fall or balance concerns reported    No   Warm-up and Cool-down Performed on first and last piece of equipment   Resistance Training Performed Yes   VAD Patient? No     Pain Assessment   Currently in Pain? No/denies   Multiple Pain Sites No         Goals Met:  Independence with exercise equipment Exercise tolerated well No report of cardiac concerns or symptoms Strength training completed today  Goals Unmet:  Not Applicable  Comments: Pt able to follow exercise prescription today without complaint.  Will continue to monitor for progression. Reviewed METs average and discussed progression with pt today.    Dr. Emily Filbert is Medical Director for [place] and [facility].

## 2015-10-17 ENCOUNTER — Encounter: Payer: Medicare Other | Admitting: *Deleted

## 2015-10-17 DIAGNOSIS — Z952 Presence of prosthetic heart valve: Secondary | ICD-10-CM | POA: Diagnosis not present

## 2015-10-17 NOTE — Progress Notes (Signed)
Daily Session Note  Patient Details  Name: Heidi Middleton MRN: [id number] Date of Birth: 1945-03-26 Referring Provider:   Flowsheet Row Cardiac Rehab from 09/12/2015 in [facility] Cardiac and Pulmonary Rehab  Referring Provider  [name]      Encounter Date: 10/17/2015  Check In:     Session Check In - 10/17/15 0934      Check-In   Location [facility]-Cardiac & Pulmonary Rehab   Staff Present Alberteen Sam, MA, ACSM RCEP, Exercise Physiologist;Kelly Amedeo Plenty, BS, ACSM CEP, Exercise Physiologist;Susanne Bice, RN, BSN, CCRP   Supervising physician immediately available to respond to emergencies See telemetry face sheet for immediately available ER MD   Medication changes reported     No   Fall or balance concerns reported    No   Warm-up and Cool-down Performed on first and last piece of equipment   Resistance Training Performed Yes   VAD Patient? No     Pain Assessment   Currently in Pain? No/denies   Multiple Pain Sites No         Goals Met:  Independence with exercise equipment Exercise tolerated well No report of cardiac concerns or symptoms Strength training completed today  Goals Unmet:  Not Applicable  Comments: Pt able to follow exercise prescription today without complaint.  Will continue to monitor for progression.    Dr. Emily Filbert is Medical Director for [place] and [facility].

## 2015-10-19 ENCOUNTER — Encounter: Payer: Medicare Other | Attending: Cardiothoracic Surgery

## 2015-10-19 DIAGNOSIS — Z952 Presence of prosthetic heart valve: Secondary | ICD-10-CM | POA: Diagnosis present

## 2015-10-19 NOTE — Progress Notes (Signed)
Daily Session Note  Patient Details  Name: Heidi Middleton MRN: [id number] Date of Birth: 11/01/1945 Referring Provider:   Flowsheet Row Cardiac Rehab from 09/12/2015 in [facility] Cardiac and Pulmonary Rehab  Referring Provider  [name]      Encounter Date: 10/19/2015  Check In:     Session Check In - 10/19/15 0854      Check-In   Staff Present Alberteen Sam, MA, ACSM RCEP, Exercise Physiologist;Yoshiaki Kreuser Oletta Darter, BA, ACSM CEP, Exercise Physiologist;Carroll Enterkin, RN, BSN   Supervising physician immediately available to respond to emergencies See telemetry face sheet for immediately available ER MD   Medication changes reported     No   Fall or balance concerns reported    No   Warm-up and Cool-down Performed on first and last piece of equipment   Resistance Training Performed Yes   VAD Patient? No     Pain Assessment   Currently in Pain? No/denies   Multiple Pain Sites No         Goals Met:  Independence with exercise equipment Exercise tolerated well Personal goals reviewed Strength training completed today  Goals Unmet:  Not Applicable  Comments: Pt able to follow exercise prescription today without complaint.  Will continue to monitor for progression.    Dr. Emily Filbert is Medical Director for [place] and [facility].

## 2015-10-21 ENCOUNTER — Encounter: Payer: Medicare Other | Admitting: *Deleted

## 2015-10-21 DIAGNOSIS — Z952 Presence of prosthetic heart valve: Secondary | ICD-10-CM | POA: Diagnosis not present

## 2015-10-21 NOTE — Progress Notes (Signed)
Daily Session Note  Patient Details  Name: GRETE BOSKO MRN: [id number] Date of Birth: 1946-01-21 Referring Provider:   Flowsheet Row Cardiac Rehab from 09/12/2015 in [facility] Cardiac and Pulmonary Rehab  Referring Provider  [name]      Encounter Date: 10/21/2015  Check In:     Session Check In - 10/21/15 0856      Check-In   Staff Present Heath Lark, RN, BSN, CCRP;Carroll Enterkin, RN, Levie Heritage, MA, ACSM RCEP, Exercise Physiologist   Supervising physician immediately available to respond to emergencies See telemetry face sheet for immediately available ER MD   Medication changes reported     No   Fall or balance concerns reported    No   Warm-up and Cool-down Performed on first and last piece of equipment   Resistance Training Performed Yes   VAD Patient? No     Pain Assessment   Currently in Pain? No/denies         Goals Met:  Exercise tolerated well No report of cardiac concerns or symptoms Strength training completed today  Goals Unmet:  Not Applicable  Comments: Doing well with exercise prescription progression.    Dr. Emily Filbert is Medical Director for [place] and [facility].

## 2015-10-24 ENCOUNTER — Encounter: Payer: Medicare Other | Admitting: *Deleted

## 2015-10-24 DIAGNOSIS — Z952 Presence of prosthetic heart valve: Secondary | ICD-10-CM | POA: Diagnosis not present

## 2015-10-24 NOTE — Progress Notes (Signed)
Daily Session Note  Patient Details  Name: STACIA FEAZELL MRN: [id number] Date of Birth: March 11, 1946 Referring Provider:   Flowsheet Row Cardiac Rehab from 09/12/2015 in [facility] Cardiac and Pulmonary Rehab  Referring Provider  [name]      Encounter Date: 10/24/2015  Check In:     Session Check In - 10/24/15 0844      Check-In   Location [facility]-Cardiac & Pulmonary Rehab   Staff Present Alberteen Sam, MA, ACSM RCEP, Exercise Physiologist;Susanne Bice, RN, BSN, Laveda Norman, BS, ACSM CEP, Exercise Physiologist   Supervising physician immediately available to respond to emergencies See telemetry face sheet for immediately available ER MD   Medication changes reported     No   Fall or balance concerns reported    No   Warm-up and Cool-down Performed on first and last piece of equipment   Resistance Training Performed Yes   VAD Patient? No     Pain Assessment   Currently in Pain? No/denies   Multiple Pain Sites No         Goals Met:  Independence with exercise equipment Exercise tolerated well No report of cardiac concerns or symptoms Strength training completed today  Goals Unmet:  Not Applicable  Comments: Pt able to follow exercise prescription today without complaint.  Will continue to monitor for progression.    Dr. Emily Filbert is Medical Director for [place] and [facility].

## 2015-10-26 ENCOUNTER — Ambulatory Visit: Payer: Medicare Other | Admitting: Cardiology

## 2015-10-26 DIAGNOSIS — Z952 Presence of prosthetic heart valve: Secondary | ICD-10-CM | POA: Diagnosis not present

## 2015-10-26 NOTE — Progress Notes (Signed)
Daily Session Note  Patient Details  Name: Heidi Middleton MRN: [id number] Date of Birth: 1945/11/07 Referring Provider:   Flowsheet Row Cardiac Rehab from 09/12/2015 in [facility] Cardiac and Pulmonary Rehab  Referring Provider  [name]      Encounter Date: 10/26/2015  Check In:     Session Check In - 10/26/15 0819      Check-In   Location [facility]-Cardiac & Pulmonary Rehab   Staff Present Alberteen Sam, MA, ACSM RCEP, Exercise Physiologist;Josha Weekley Oletta Darter, BA, ACSM CEP, Exercise Physiologist;Carroll Enterkin, RN, BSN   Supervising physician immediately available to respond to emergencies See telemetry face sheet for immediately available ER MD   Medication changes reported     No   Fall or balance concerns reported    No   Warm-up and Cool-down Performed on first and last piece of equipment   Resistance Training Performed Yes   VAD Patient? No     Pain Assessment   Currently in Pain? No/denies   Multiple Pain Sites No         Goals Met:  Independence with exercise equipment Exercise tolerated well Strength training completed today  Goals Unmet:  Not Applicable  Comments:  Pt able to follow exercise prescription today without complaint.  Will continue to monitor for progression.   Dr. Emily Filbert is Medical Director for [place] and [facility].

## 2015-10-27 ENCOUNTER — Ambulatory Visit: Payer: Medicare Other | Admitting: Cardiology

## 2015-10-28 ENCOUNTER — Encounter: Payer: Medicare Other | Admitting: *Deleted

## 2015-10-28 DIAGNOSIS — Z952 Presence of prosthetic heart valve: Secondary | ICD-10-CM | POA: Diagnosis not present

## 2015-10-28 NOTE — Progress Notes (Signed)
Daily Session Note  Patient Details  Name: Heidi Middleton MRN: [id number] Date of Birth: 1946/02/28 Referring Provider:   Flowsheet Row Cardiac Rehab from 09/12/2015 in [facility] Cardiac and Pulmonary Rehab  Referring Provider  [name]      Encounter Date: 10/28/2015  Check In:     Session Check In - 10/28/15 0830      Check-In   Location [facility]-Cardiac & Pulmonary Rehab   Staff Present Gerlene Burdock, RN, BSN;Susanne Bice, RN, BSN, CCRP;Jessica Luan Pulling, MA, ACSM RCEP, Exercise Physiologist   Supervising physician immediately available to respond to emergencies See telemetry face sheet for immediately available ER MD   Medication changes reported     No   Fall or balance concerns reported    No   Warm-up and Cool-down Performed on first and last piece of equipment   Resistance Training Performed No   VAD Patient? No     Pain Assessment   Currently in Pain? No/denies         Goals Met:  Proper associated with RPD/PD & O2 Sat Exercise tolerated well  Goals Unmet:  Not Applicable  Comments:     Dr. Emily Filbert is Medical Director for [place] and [facility].

## 2015-10-31 ENCOUNTER — Encounter: Payer: Medicare Other | Admitting: *Deleted

## 2015-10-31 DIAGNOSIS — Z952 Presence of prosthetic heart valve: Secondary | ICD-10-CM | POA: Diagnosis not present

## 2015-10-31 NOTE — Progress Notes (Signed)
Daily Session Note  Patient Details  Name: Heidi Middleton MRN: [id number] Date of Birth: 1945-11-26 Referring Provider:   Flowsheet Row Cardiac Rehab from 09/12/2015 in [facility] Cardiac and Pulmonary Rehab  Referring Provider  [name]      Encounter Date: 10/31/2015  Check In:     Session Check In - 10/31/15 0805      Check-In   Location [facility]-Cardiac & Pulmonary Rehab   Staff Present Alberteen Sam, MA, ACSM RCEP, Exercise Physiologist;Kelly Amedeo Plenty, BS, ACSM CEP, Exercise Physiologist;Carroll Enterkin, RN, BSN   Supervising physician immediately available to respond to emergencies See telemetry face sheet for immediately available ER MD   Medication changes reported     No   Fall or balance concerns reported    No   Warm-up and Cool-down Performed on first and last piece of equipment   Resistance Training Performed Yes   VAD Patient? No     Pain Assessment   Currently in Pain? No/denies   Multiple Pain Sites No         Goals Met:  Independence with exercise equipment Exercise tolerated well No report of cardiac concerns or symptoms Strength training completed today  Completed 6 min post walk test   Goals Unmet:  Not Applicable  Comments: Pt able to follow exercise prescription today without complaint.  Will continue to monitor for progression.      Lynn Name 09/12/15 1251 10/31/15 0953       6 Minute Walk   Phase  - Discharge    Distance 1365 feet 1646 feet    Distance % Change  - 20.6 %    Walk Time 6 minutes 6 minutes    # of Rest Breaks 0 0    MPH 2.6 3.12    METS 3.6 3.9    RPE 11 13    VO2 Peak 12.7 13.7    Symptoms No No    Resting HR 88 bpm 79 bpm    Resting BP 132/70 132/66    Max Ex. HR 126 bpm 101 bpm    Max Ex. BP 154/68 164/62       Dr. Emily Filbert is Medical Director for [place] and [facility].

## 2015-11-02 ENCOUNTER — Encounter: Payer: Medicare Other | Admitting: *Deleted

## 2015-11-02 ENCOUNTER — Encounter: Payer: Self-pay | Admitting: *Deleted

## 2015-11-02 DIAGNOSIS — Z952 Presence of prosthetic heart valve: Secondary | ICD-10-CM

## 2015-11-02 NOTE — Progress Notes (Signed)
Daily Session Note  Patient Details  Name: Heidi Middleton MRN: [id number] Date of Birth: Aug 04, 1945 Referring Provider:   Flowsheet Row Cardiac Rehab from 09/12/2015 in [facility] Cardiac and Pulmonary Rehab  Referring Provider  [name]      Encounter Date: 11/02/2015  Check In:     Session Check In - 11/02/15 0848      Check-In   Location [facility]-Cardiac & Pulmonary Rehab   Staff Present Alberteen Sam, MA, ACSM RCEP, Exercise Physiologist;Graeden Bitner Oletta Darter, BA, ACSM CEP, Exercise Physiologist;Carroll Enterkin, RN, BSN   Supervising physician immediately available to respond to emergencies See telemetry face sheet for immediately available ER MD   Medication changes reported     No   Fall or balance concerns reported    No   Warm-up and Cool-down Performed on first and last piece of equipment   Resistance Training Performed Yes   VAD Patient? No     Pain Assessment   Currently in Pain? No/denies   Multiple Pain Sites No         Goals Met:  Proper associated with RPD/PD & O2 Sat Independence with exercise equipment No report of cardiac concerns or symptoms Strength training completed today  Goals Unmet:  Not Applicable  Comments: Pt able to follow exercise prescription today without complaint.  Will continue to monitor for progression.    Dr. Emily Filbert is Medical Director for [place] and [facility].

## 2015-11-02 NOTE — Progress Notes (Signed)
Cardiac Individual Treatment Plan  Patient Details  Name: Heidi Middleton MRN: [id number] Date of Birth: 12/30/1945 Referring Provider:   Flowsheet Row Cardiac Rehab from 09/12/2015 in [facility] Cardiac and Pulmonary Rehab  Referring Provider  [name]      Initial Encounter Date:  Flowsheet Row Cardiac Rehab from 09/12/2015 in [facility] Cardiac and Pulmonary Rehab  Date  09/12/15  Referring Provider  [name]      Visit Diagnosis: S/P aortic valve replacement  Patient's Home Medications on Admission:  Current Outpatient Prescriptions:  .  acetaminophen (TYLENOL) 500 MG tablet, Take 2 tablets (1,000 mg total) by mouth every 6 (six) hours as needed for mild pain or fever., Disp: 30 tablet, Rfl: 0 .  aspirin EC 325 MG EC tablet, Take 1 tablet (325 mg total) by mouth daily., Disp: 30 tablet, Rfl: 0 .  atorvastatin (LIPITOR) 10 MG tablet, TAKE 0.5 TABLETS (5 MG TOTAL) BY MOUTH DAILY., Disp: 45 tablet, Rfl: 3 .  cholecalciferol (VITAMIN D) 1000 UNITS tablet, Take 1,000 Units by mouth 2 (two) times daily. , Disp: , Rfl:  .  esomeprazole (NEXIUM) 40 MG capsule, TAKE 1 CAPSULE (40 MG TOTAL) BY MOUTH DAILY BEFORE BREAKFAST., Disp: 90 capsule, Rfl: 3 .  lisinopril (PRINIVIL,ZESTRIL) 10 MG tablet, Take 1 tablet (10 mg total) by mouth daily., Disp: 30 tablet, Rfl: 3 .  metoprolol tartrate (LOPRESSOR) 25 MG tablet, Take 0.5 tablets (12.5 mg total) by mouth 2 (two) times daily., Disp: 30 tablet, Rfl: 3 .  Multiple Vitamin (MULTIVITAMIN) tablet, Take 1 tablet by mouth daily., Disp: , Rfl:   Past Medical History: Past Medical History:  Diagnosis Date  . Aortic stenosis   . Colonic polyp   . GERD (gastroesophageal reflux disease)   . Headache    migraines prior to hysterectomy  . Hypertension   . PONV (postoperative nausea and vomiting)    nausea  . Shingles     Tobacco Use: History  Smoking Status  . Former Smoker  . Quit date: 07/07/2002  Smokeless Tobacco  . Never Used    Labs: Recent  Review Flowsheet Data    Labs for ITP Cardiac and Pulmonary Rehab Latest Ref Rng & Units 08/16/2015 08/16/2015 08/16/2015 08/17/2015 08/17/2015   Cholestrol 0 - 200 mg/dL - - - - -   LDLCALC 0 - 99 mg/dL - - - - -   HDL >39.00 mg/dL - - - - -   Trlycerides 0.0 - 149.0 mg/dL - - - - -   Hemoglobin A1c 4.8 - 5.6 % - - - - -   PHART 7.350 - 7.450 - 7.336(L) 7.283(L) - -   PCO2ART 35.0 - 45.0 mmHg - 41.9 47.7(H) - -   HCO3 20.0 - 24.0 mEq/L - 22.7 22.8 - -   TCO2 0 - 100 mmol/L 22 24 24 16 24    ACIDBASEDEF 0.0 - 2.0 mmol/L - 3.0(H) 4.0(H) - -   O2SAT % - 98.0 96.0 - -       Exercise Target Goals:    Exercise Program Goal: Individual exercise prescription set with THRR, safety & activity barriers. Participant demonstrates ability to understand and report RPE using BORG scale, to self-measure pulse accurately, and to acknowledge the importance of the exercise prescription.  Exercise Prescription Goal: Starting with aerobic activity 30 plus minutes a day, 3 days per week for initial exercise prescription. Provide home exercise prescription and guidelines that participant acknowledges understanding prior to discharge.  Activity Barriers & Risk Stratification:  Activity Barriers & Cardiac Risk Stratification - 09/12/15 1250      Activity Barriers & Cardiac Risk Stratification   Activity Barriers None   Cardiac Risk Stratification High      6 Minute Walk:     6 Minute Walk    Row Name 09/12/15 1251 10/31/15 0953       6 Minute Walk   Phase  - Discharge    Distance 1365 feet 1646 feet    Distance % Change  - 20.6 %    Walk Time 6 minutes 6 minutes    # of Rest Breaks 0 0    MPH 2.6 3.12    METS 3.6 3.9    RPE 11 13    VO2 Peak 12.7 13.7    Symptoms No No    Resting HR 88 bpm 79 bpm    Resting BP 132/70 132/66    Max Ex. HR 126 bpm 101 bpm    Max Ex. BP 154/68 164/62       Initial Exercise Prescription:     Initial Exercise Prescription - 09/12/15 1200      Date  of Initial Exercise RX and Referring Provider   Date 09/12/15   Referring Provider [name]     Treadmill   MPH 2.5   Grade 1   Minutes 15   METs 3.26     Recumbant Bike   Level 1   RPM 50   Watts 25   Minutes 15   METs 3.12     NuStep   Level 2   Watts 50   Minutes 15   METs 3.4     REL-XR   Level 2   Watts 50   Minutes 15   METs 3.4     T5 Nustep   Level 1   Watts 50   Minutes 15   METs 3.4     Biostep-RELP   Level 2   Watts 50   Minutes 15   METs 3.4     Prescription Details   Frequency (times per week) 3   Duration Progress to 30 minutes of continuous aerobic without signs/symptoms of physical distress     Intensity   THRR 40-80% of Max Heartrate 112-137   Ratings of Perceived Exertion 11-13   Perceived Dyspnea 0-4     Progression   Progression Continue to progress workloads to maintain intensity without signs/symptoms of physical distress.     Resistance Training   Training Prescription Yes   Weight 2   Reps 10-15      Perform Capillary Blood Glucose checks as needed.  Exercise Prescription Changes:     Exercise Prescription Changes    Row Name 09/15/15 0800 09/28/15 1500 10/07/15 0900 10/12/15 1000 10/26/15 1400     Exercise Review   Progression No Yes Yes Yes Yes     Response to Exercise   Blood Pressure (Admit) 124/64 132/66  - 124/60 118/60   Blood Pressure (Exercise) 138/62 130/60  - 142/76 142/70   Blood Pressure (Exit) 124/68 112/70  - 124/60 118/60   Heart Rate (Admit) 77 bpm 53 bpm  - 73 bpm 78 bpm   Heart Rate (Exercise) 88 bpm 106 bpm  - 109 bpm 107 bpm   Heart Rate (Exit) 78 bpm 88 bpm  - 53 bpm 87 bpm   Rating of Perceived Exertion (Exercise)  - 13  - 13 13   Symptoms  - none none none none  Comments  -  - Home Exercise given 10/07/15 Home Exercise given 10/07/15 Home Exercise given 10/07/15   Duration  - Progress to 45 minutes of aerobic exercise without signs/symptoms of physical distress Progress to 45 minutes of  aerobic exercise without signs/symptoms of physical distress Progress to 45 minutes of aerobic exercise without signs/symptoms of physical distress Progress to 45 minutes of aerobic exercise without signs/symptoms of physical distress   Intensity  - THRR unchanged THRR unchanged THRR unchanged THRR unchanged     Progression   Progression Continue to progress workloads to maintain intensity without signs/symptoms of physical distress. Continue to progress workloads to maintain intensity without signs/symptoms of physical distress. Continue to progress workloads to maintain intensity without signs/symptoms of physical distress. Continue to progress workloads to maintain intensity without signs/symptoms of physical distress. Continue to progress workloads to maintain intensity without signs/symptoms of physical distress.   Average METs  - 3.84 3.84 4.63 5.06     Resistance Training   Training Prescription Yes Yes Yes Yes Yes   Weight 3 3 3 3 3  lbs   Reps 10-15 10-15 10-15 10-15 10-15     Interval Training   Interval Training  - No No No No     Treadmill   MPH  - 2.7 2.7 3.3 3.3   Grade  - 1.5 1.5 3 3    Minutes  - 15 15 15 15    METs  - 3.53 3.53 4.89 4.89     NuStep   Level 2 4 4 4 4    Watts 25 -  3.3 METs -  3.3 METs  -  -   Minutes 27 15 15 15 15    METs 2.1 3.3 3.3 3.5 3.5     REL-XR   Level  - 4 4 4 4    Minutes  - 15 15 15 15    METs  - 4.3 4.3 5.5 6.3     Home Exercise Plan   Plans to continue exercise at  -  - [employer] (comment)  walking, [employer], [employer] (comment)  walking, [employer], [employer] (comment)  walking, [employer], [employer]  -  - Add 2 additional days to program exercise sessions. Add 2 additional days to program exercise sessions. Add 2 additional days to program exercise sessions.      Exercise Comments:     Exercise Comments    Row Name 09/23/15 [id number] 09/28/15 1534 10/07/15 0847 10/12/15 1053 10/14/15 1018    Exercise Comments Tried new rotation today with 15 min stations.  Tried XR for first time today with success! [place] is off to a good start with exercise.  She is already starting to notice a difference in being able to do more around the house.  We will continue to work with her on making increases in her progression.  Today she was able to bump up her treadmill. Reviewed home exercise with pt today.  Pt plans to walk and return back to classes at [employer] and [facility] for exercise.  Reviewed THR, pulse, RPE, sign and symptoms, and when to call 911 or MD.  Also discussed weather considerations and indoor options.  Pt voiced understanding. Jerlean is doing great with her exercise.  At last visit, we talked about adding in her home exercise and going back to the gym for her exercise classes.  She is also making good progress on the equipment in here.  We will continue to monitor for  progression. Reviewed METs average and discussed progression with pt today.   [place] Name 10/19/15 1024 10/26/15 1444         Exercise Comments Anh is back to the gym and has restarted her classes without any problems.  She is planning to go back to watching her grandkids upon completion of the program at the end of the month.  She said that she feels better than she did before Karema is doing great with exercise.  She is going to the gym on her off days.  She is looking forward to being able to keep her grandchildren again.  She is up to 6.3 METs on the XR.  We will continue to monitor for progression.         Discharge Exercise Prescription (Final Exercise Prescription Changes):     Exercise Prescription Changes - 10/26/15 1400      Exercise Review   Progression Yes     Response to Exercise   Blood Pressure (Admit) 118/60   Blood Pressure (Exercise) 142/70   Blood Pressure (Exit) 118/60   Heart Rate (Admit) 78 bpm   Heart Rate (Exercise) 107 bpm   Heart Rate (Exit) 87 bpm   Rating of Perceived Exertion (Exercise) 13    Symptoms none   Comments Home Exercise given 10/07/15   Duration Progress to 45 minutes of aerobic exercise without signs/symptoms of physical distress   Intensity THRR unchanged     Progression   Progression Continue to progress workloads to maintain intensity without signs/symptoms of physical distress.   Average METs 5.06     Resistance Training   Training Prescription Yes   Weight 3 lbs   Reps 10-15     Interval Training   Interval Training No     Treadmill   MPH 3.3   Grade 3   Minutes 15   METs 4.89     NuStep   Level 4   Minutes 15   METs 3.5     REL-XR   Level 4   Minutes 15   METs 6.3     Home Exercise Plan   Plans to continue exercise at [employer] (comment)  walking, [employer], [employer]   Frequency Add 2 additional days to program exercise sessions.      Nutrition:  Target Goals: Understanding of nutrition guidelines, daily intake of sodium 1500mg , cholesterol 200mg , calories 30% from fat and 7% or less from saturated fats, daily to have 5 or more servings of fruits and vegetables.  Biometrics:     Pre Biometrics - 09/12/15 1250      Pre Biometrics   Height 5\' 8"  (1.727 m)   Weight 160 lb 4.8 oz (72.7 kg)   Waist Circumference 33.13 inches   Hip Circumference 41.25 inches   Waist to Hip Ratio 0.8 %   BMI (Calculated) 24.4       Nutrition Therapy Plan and Nutrition Goals:     Nutrition Therapy & Goals - 09/19/15 1032      Nutrition Therapy   Diet Instructed on  heart healthy ways to increase calories to prevent further weight loss. Placed emphasis on adequate protein. Patient has a weight goal of 165 lbs.     Drug/Food Interactions Statins/Certain Fruits   Protein (specify units) 8   Fiber 20 grams   Whole Grain Foods 3 servings   Saturated Fats 12 max. grams   Fruits and Vegetables 5 servings/day   Sodium 1500 grams     Personal  Nutrition Goals   Personal Goal #1 Include 2-3 small snacks in addition to meals such as peanut  butter with crackers or apple or pecans (patient has these available).   Personal Goal #2 Include a protein source at all meals with goal of 8 oz/day of protein or protein equivalent (Refer to list given).   Personal Goal #3 Consider adding a trans-free margarine to any appropriate foods to help increase calories.     Intervention Plan   Intervention Prescribe, educate and counsel regarding individualized specific dietary modifications aiming towards targeted core components such as weight, hypertension, lipid management, diabetes, heart failure and other comorbidities.;Nutrition handout(s) given to patient.   Expected Outcomes Short Term Goal: Understand basic principles of dietary content, such as calories, fat, sodium, cholesterol and nutrients.;Short Term Goal: A plan has been developed with personal nutrition goals set during dietitian appointment.;Long Term Goal: Adherence to prescribed nutrition plan.      Nutrition Discharge: Rate Your Plate Scores:     Nutrition Assessments - 09/21/15 0937      Rate Your Plate Scores   Pre Score 74   Pre Score % 82 %      Nutrition Goals Re-Evaluation:   Psychosocial: Target Goals: Acknowledge presence or absence of depression, maximize coping skills, provide positive support system. Participant is able to verbalize types and ability to use techniques and skills needed for reducing stress and depression.  Initial Review & Psychosocial Screening:     Initial Psych Review & Screening - 09/12/15 1545      Initial Review   Current issues with Current Sleep Concerns;Current Stress Concerns   Source of Stress Concerns Unable to participate in former interests or hobbies   Comments Patient states she was very active prior to surgery, as she is the caregiver for her husband.  Prior to surgery she was able to garden, exercise 4 x /week, mow the yard, and do the weed eating in and around her home.  She states she is bored.       Family Dynamics    Good Support System? No   Strains Illness and family care strain   Concerns No support system   Comments Upon asking Nyazia about good family support she stated, "no!"  She is the caregiver for her husband and she feels like he could do more for himself, but he refuses.  Also, she was keeping her grandchildren every morning from 6 a.m. to 8 a.m. until the school / day care opened.       Barriers   Psychosocial barriers to participate in program The patient should benefit from training in stress management and relaxation.     Screening Interventions   Interventions Program counselor consult;Encouraged to exercise      Quality of Life Scores:     Quality of Life - 09/12/15 1550      Quality of Life Scores   Health/Function Pre 21.68 %   Socioeconomic Pre 27.92 %   Psych/Spiritual Pre 21.36 %   Family Pre 19 %   GLOBAL Pre 22.36 %      PHQ-9: Recent Review Flowsheet Data    Depression screen [facility] 2/9 09/12/2015 06/20/2015 06/21/2014 06/15/2013 06/13/2012   Decreased Interest 1 0 0 0 0   Down, Depressed, Hopeless 1 0 0 0 0   PHQ - 2 Score 2 0 0 0 0   Altered sleeping 3 - - - -   Tired, decreased energy 2 - - - -   Change  in appetite 3 - - - -   Feeling bad or failure about yourself  1 - - - -   Trouble concentrating 3 - - - -   Moving slowly or fidgety/restless 0 - - - -   Suicidal thoughts 0 - - - -   PHQ-9 Score 14 - - - -   Difficult doing work/chores Somewhat difficult  - - - -      Psychosocial Evaluation and Intervention:     Psychosocial Evaluation - 09/26/15 0959      Psychosocial Evaluation & Interventions   Continued Psychosocial Services Needed Yes  Ms. Kazan will benefit from the psychoeducational components of this program - especially stress management and depression.        Psychosocial Re-Evaluation:     Psychosocial Re-Evaluation    Eloy Name 10/26/15 [phone]             Psychosocial Re-Evaluation   Comments Follow up with Ms. Radosevich reporting she  is doing well in this program.  Her mood has improved and she is enjoying the class.  Ms. Eisenstadt was already working out regularly prior to this program, so she sees it as just rehabilitation to get back to her normal routine.  Counselor will continue to follow as needed.           Vocational Rehabilitation: Provide vocational rehab assistance to qualifying candidates.   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        Education: Education Goals: Education classes will be provided on a weekly basis, covering required topics. Participant will state understanding/return demonstration of topics presented.  Learning Barriers/Preferences:     Learning Barriers/Preferences - 09/12/15 1250      Learning Barriers/Preferences   Learning Barriers None   Learning Preferences None      Education Topics: General Nutrition Guidelines/Fats and Fiber: -Group instruction provided by verbal, written material, models and posters to present the general guidelines for heart healthy nutrition. Gives an explanation and review of dietary fats and fiber. Flowsheet Row Cardiac Rehab from 10/31/2015 in [facility] Cardiac and Pulmonary Rehab  Date  10/10/15  Educator  CR  Instruction Review Code  2- meets goals/outcomes      Controlling Sodium/Reading Food Labels: -Group verbal and written material supporting the discussion of sodium use in heart healthy nutrition. Review and explanation with models, verbal and written materials for utilization of the food label. Flowsheet Row Cardiac Rehab from 10/31/2015 in [facility] Cardiac and Pulmonary Rehab  Date  10/24/15  Educator  CR  Instruction Review Code  2- meets goals/outcomes      Exercise Physiology & Risk Factors: - Group verbal and written instruction  with models to review the exercise physiology of the cardiovascular system and associated critical values. Details cardiovascular disease risk factors and the goals associated with each risk factor.   Aerobic Exercise & Resistance Training: - Gives group verbal and written discussion on the health impact of inactivity. On the components of aerobic and resistive training programs and the benefits of this training and how to safely progress through these programs. Flowsheet Row Cardiac Rehab from 10/31/2015 in [facility] Cardiac and Pulmonary Rehab  Date  10/26/15  Educator  [facility]  Instruction Review Code  2- meets goals/outcomes  Flexibility, Balance, General Exercise Guidelines: - Provides group verbal and written instruction on the benefits of flexibility and balance training programs. Provides general exercise guidelines with specific guidelines to those with heart or lung disease. Demonstration and skill practice provided. Flowsheet Row Cardiac Rehab from 10/31/2015 in [facility] Cardiac and Pulmonary Rehab  Date  10/31/15  Educator  [facility]  Instruction Review Code  2- meets goals/outcomes      Stress Management: - Provides group verbal and written instruction about the health risks of elevated stress, cause of high stress, and healthy ways to reduce stress. Flowsheet Row Cardiac Rehab from 10/31/2015 in [facility] Cardiac and Pulmonary Rehab  Date  09/14/15  Azalia Bilis, MSW  Instruction Review Code  2- meets goals/outcomes      Depression: - Provides group verbal and written instruction on the correlation between heart/lung disease and depressed mood, treatment options, and the stigmas associated with seeking treatment. Flowsheet Row Cardiac Rehab from 10/31/2015 in [facility] Cardiac and Pulmonary Rehab  Date  10/12/15  Educator  [facility]  Instruction Review Code  2- meets goals/outcomes      Anatomy & Physiology of the Heart: - Group verbal and written instruction and models provide basic cardiac  anatomy and physiology, with the coronary electrical and arterial systems. Review of: AMI, Angina, Valve disease, Heart Failure, Cardiac Arrhythmia, Pacemakers, and the ICD.   Cardiac Procedures: - Group verbal and written instruction and models to describe the testing methods done to diagnose heart disease. Reviews the outcomes of the test results. Describes the treatment choices: Medical Management, Angioplasty, or Coronary Bypass Surgery. Flowsheet Row Cardiac Rehab from 10/31/2015 in [facility] Cardiac and Pulmonary Rehab  Date  09/21/15  Educator  CE  Instruction Review Code  2- meets goals/outcomes      Cardiac Medications: - Group verbal and written instruction to review commonly prescribed medications for heart disease. Reviews the medication, class of the drug, and side effects. Includes the steps to properly store meds and maintain the prescription regimen. Flowsheet Row Cardiac Rehab from 10/31/2015 in [facility] Cardiac and Pulmonary Rehab  Date  10/03/15  Educator  SB  Instruction Review Code  2- meets goals/outcomes [cardiac meds lecture 2]      Go Sex-Intimacy & Heart Disease, Get SMART - Goal Setting: - Group verbal and written instruction through game format to discuss heart disease and the return to sexual intimacy. Provides group verbal and written material to discuss and apply goal setting through the application of the S.M.A.R.T. Method. Flowsheet Row Cardiac Rehab from 10/31/2015 in [facility] Cardiac and Pulmonary Rehab  Date  09/21/15  Educator  CE  Instruction Review Code  2- meets goals/outcomes      Other Matters of the Heart: - Provides group verbal, written materials and models to describe Heart Failure, Angina, Valve Disease, and Diabetes in the realm of heart disease. Includes description of the disease process and treatment options available to the cardiac patient.   Exercise & Equipment Safety: - Individual verbal instruction and demonstration of equipment use and  safety with use of the equipment. Flowsheet Row Cardiac Rehab from 10/31/2015 in [facility] Cardiac and Pulmonary Rehab  Date  09/14/15  Educator  C. EnterkinRN  Instruction Review Code  2- meets goals/outcomes      Infection Prevention: - Provides verbal and written material to individual with discussion of infection control including proper hand washing and proper equipment cleaning during exercise session. Flowsheet Row Cardiac Rehab from 10/31/2015 in [facility] Cardiac and Pulmonary  Rehab  Date  09/12/15  Educator  DW  Instruction Review Code  2- meets goals/outcomes      Falls Prevention: - Provides verbal and written material to individual with discussion of falls prevention and safety. Flowsheet Row Cardiac Rehab from 10/31/2015 in [facility] Cardiac and Pulmonary Rehab  Date  09/12/15  Educator  DW  Instruction Review Code  2- meets goals/outcomes      Diabetes: - Individual verbal and written instruction to review signs/symptoms of diabetes, desired ranges of glucose level fasting, after meals and with exercise. Advice that pre and post exercise glucose checks will be done for 3 sessions at entry of program.    Knowledge Questionnaire Score:     Knowledge Questionnaire Score - 09/12/15 1931      Knowledge Questionnaire Score   Pre Score 25/28      Core Components/Risk Factors/Patient Goals at Admission:     Personal Goals and Risk Factors at Admission - 09/12/15 1254      Core Components/Risk Factors/Patient Goals on Admission    Weight Management Weight Maintenance   Admit Weight 160 lb 3 oz (72.7 kg)   Expected Outcomes Weight Maintenance: Understanding of the daily nutrition guidelines, which includes 25-35% calories from fat, 7% or less cal from saturated fats, less than 200mg  cholesterol, less than 1.5gm of sodium, & 5 or more servings of fruits and vegetables daily   Sedentary Yes   Intervention Provide advice, education, support and counseling about physical  activity/exercise needs.;Develop an individualized exercise prescription for aerobic and resistive training based on initial evaluation findings, risk stratification, comorbidities and participant's personal goals.   Expected Outcomes Achievement of increased cardiorespiratory fitness and enhanced flexibility, muscular endurance and strength shown through measurements of functional capacity and personal statement of participant.   Increase Strength and Stamina Yes   Intervention Provide advice, education, support and counseling about physical activity/exercise needs.;Develop an individualized exercise prescription for aerobic and resistive training based on initial evaluation findings, risk stratification, comorbidities and participant's personal goals.   Expected Outcomes Achievement of increased cardiorespiratory fitness and enhanced flexibility, muscular endurance and strength shown through measurements of functional capacity and personal statement of participant.   Hypertension Yes   Intervention Provide education on lifestyle modifcations including regular physical activity/exercise, weight management, moderate sodium restriction and increased consumption of fresh fruit, vegetables, and low fat dairy, alcohol moderation, and smoking cessation.;Monitor prescription use compliance.   Expected Outcomes Short Term: Continued assessment and intervention until BP is < 140/71mm HG in hypertensive participants. < 130/41mm HG in hypertensive participants with diabetes, heart failure or chronic kidney disease.;Long Term: Maintenance of blood pressure at goal levels.   Lipids Yes   Intervention Provide education and support for participant on nutrition & aerobic/resistive exercise along with prescribed medications to achieve LDL 70mg , HDL >40mg .   Expected Outcomes Short Term: Participant states understanding of desired cholesterol values and is compliant with medications prescribed. Participant is following  exercise prescription and nutrition guidelines.;Long Term: Cholesterol controlled with medications as prescribed, with individualized exercise RX and with personalized nutrition plan. Value goals: LDL < 70mg , HDL > 40 mg.   Stress Yes   Intervention Offer individual and/or small group education and counseling on adjustment to heart disease, stress management and health-related lifestyle change. Teach and support self-help strategies.;Refer participants experiencing significant psychosocial distress to appropriate mental health specialists for further evaluation and treatment. When possible, include family members and significant others in education/counseling sessions.   Expected Outcomes Short Term: Participant demonstrates  changes in health-related behavior, relaxation and other stress management skills, ability to obtain effective social support, and compliance with psychotropic medications if prescribed.;Long Term: Emotional wellbeing is indicated by absence of clinically significant psychosocial distress or social isolation.      Core Components/Risk Factors/Patient Goals Review:      Goals and Risk Factor Review    Row Name 09/28/15 0955 10/19/15 1022           Core Components/Risk Factors/Patient Goals Review   Personal Goals Review Weight Management/Obesity;Sedentary;Increase Strength and Stamina;Lipids;Stress;Hypertension Hypertension;Lipids;Stress;Sedentary;Increase Strength and Stamina      Review Tanell is starting to feel better.  She is walking with her dog some and now able to do more housework.  Still has lifting restrictions.  Her stress now is that she is bored.  She misses keeping her great grandchildren in the morning.  She was encouraged to start back part time to see how she is able to tolerate it fatigue wise.  Her blood pressure ans lipids have all been doing great.  Her weight is stable and she is looking forward to getting her appetite back. Ilka is back to the gym and has  restarted her classes without any problems.  She is planning to go back to watching her grandkids upon completion of the program at the end of the month.  She said that she feels better than she did before!  Her blood pressures have been good and she has reduced her stress levels.      Expected Outcomes Norlene will continue to do well with exercise and will hopefully be able to keep her great grands again. Coraima will continue to come to exercise and education classes.  We will continue to monitor her blood pressures.         Core Components/Risk Factors/Patient Goals at Discharge (Final Review):      Goals and Risk Factor Review - 10/19/15 1022      Core Components/Risk Factors/Patient Goals Review   Personal Goals Review Hypertension;Lipids;Stress;Sedentary;Increase Strength and Stamina   Review Pavithra is back to the gym and has restarted her classes without any problems.  She is planning to go back to watching her grandkids upon completion of the program at the end of the month.  She said that she feels better than she did before!  Her blood pressures have been good and she has reduced her stress levels.   Expected Outcomes Cirilla will continue to come to exercise and education classes.  We will continue to monitor her blood pressures.      ITP Comments:     ITP Comments    Row Name 10/05/15 0754 11/02/15 0755         ITP Comments 30 day review. Continue with ITP 30 day review. Continue with ITP unless changes noted by Medical Director at signature of review.         Comments:

## 2015-11-04 ENCOUNTER — Encounter: Payer: Medicare Other | Admitting: *Deleted

## 2015-11-04 DIAGNOSIS — Z952 Presence of prosthetic heart valve: Secondary | ICD-10-CM

## 2015-11-04 NOTE — Progress Notes (Signed)
Daily Session Note  Patient Details  Name: Heidi Middleton MRN: [id number] Date of Birth: November 29, 1945 Referring Provider:   Flowsheet Row Cardiac Rehab from 09/12/2015 in [facility]  Referring Provider  [name]      Encounter Date: 11/04/2015  Check In:     Session Check In - 11/04/15 0847      Check-In   Location [facility]-Cardiac & Pulmonary Rehab   Staff Present Alberteen Sam, MA, ACSM RCEP, Exercise Physiologist;Amanda Oletta Darter, BA, ACSM CEP, Exercise Physiologist;Carroll Enterkin, RN, BSN   Supervising physician immediately available to respond to emergencies See telemetry face sheet for immediately available ER MD   Medication changes reported     No   Fall or balance concerns reported    No   Warm-up and Cool-down Performed on first and last piece of equipment   Resistance Training Performed Yes   VAD Patient? No     Pain Assessment   Currently in Pain? No/denies   Multiple Pain Sites No         Goals Met:  Independence with exercise equipment Exercise tolerated well No report of cardiac concerns or symptoms Strength training completed today  Goals Unmet:  Not Applicable  Comments: Pt able to follow exercise prescription today without complaint.  Will continue to monitor for progression.    Dr. Emily Filbert is Medical Director for [place] and [facility].

## 2015-11-07 ENCOUNTER — Encounter: Payer: Medicare Other | Admitting: *Deleted

## 2015-11-07 DIAGNOSIS — Z952 Presence of prosthetic heart valve: Secondary | ICD-10-CM

## 2015-11-07 NOTE — Patient Instructions (Signed)
Discharge Instructions  Patient Details  Name: Heidi Middleton MRN: [id number] Date of Birth: 06/12/1945 Referring Provider:  Ivin Poot, MD   Number of Visits: 58  Reason for Discharge:  Patient reached a stable level of exercise. Patient independent in their exercise.  Smoking History:  History  Smoking Status  . Former Smoker  . Quit date: 07/07/2002  Smokeless Tobacco  . Never Used    Diagnosis:  S/P aortic valve replacement  Initial Exercise Prescription:     Initial Exercise Prescription - 09/12/15 1200      Date of Initial Exercise RX and Referring Provider   Date 09/12/15   Referring Provider [name]     Treadmill   MPH 2.5   Grade 1   Minutes 15   METs 3.26     Recumbant Bike   Level 1   RPM 50   Watts 25   Minutes 15   METs 3.12     NuStep   Level 2   Watts 50   Minutes 15   METs 3.4     REL-XR   Level 2   Watts 50   Minutes 15   METs 3.4     T5 Nustep   Level 1   Watts 50   Minutes 15   METs 3.4     Biostep-RELP   Level 2   Watts 50   Minutes 15   METs 3.4     Prescription Details   Frequency (times per week) 3   Duration Progress to 30 minutes of continuous aerobic without signs/symptoms of physical distress     Intensity   THRR 40-80% of Max Heartrate 112-137   Ratings of Perceived Exertion 11-13   Perceived Dyspnea 0-4     Progression   Progression Continue to progress workloads to maintain intensity without signs/symptoms of physical distress.     Resistance Training   Training Prescription Yes   Weight 2   Reps 10-15      Discharge Exercise Prescription (Final Exercise Prescription Changes):     Exercise Prescription Changes - 10/26/15 1400      Exercise Review   Progression Yes     Response to Exercise   Blood Pressure (Admit) 118/60   Blood Pressure (Exercise) 142/70   Blood Pressure (Exit) 118/60   Heart Rate (Admit) 78 bpm   Heart Rate (Exercise) 107 bpm   Heart Rate (Exit) 87 bpm   Rating of Perceived Exertion (Exercise) 13   Symptoms none   Comments Home Exercise given 10/07/15   Duration Progress to 45 minutes of aerobic exercise without signs/symptoms of physical distress   Intensity THRR unchanged     Progression   Progression Continue to progress workloads to maintain intensity without signs/symptoms of physical distress.   Average METs 5.06     Resistance Training   Training Prescription Yes   Weight 3 lbs   Reps 10-15     Interval Training   Interval Training No     Treadmill   MPH 3.3   Grade 3   Minutes 15   METs 4.89     NuStep   Level 4   Minutes 15   METs 3.5     REL-XR   Level 4   Minutes 15   METs 6.3     Home Exercise Plan   Plans to continue exercise at [employer] (comment)  walking, [employer], [employer]   Frequency Add 2 additional days to program  exercise sessions.      Functional Capacity:     6 Minute Walk    Row Name 09/12/15 1251 10/31/15 0953       6 Minute Walk   Phase  - Discharge    Distance 1365 feet 1646 feet    Distance % Change  - 20.6 %    Walk Time 6 minutes 6 minutes    # of Rest Breaks 0 0    MPH 2.6 3.12    METS 3.6 3.9    RPE 11 13    VO2 Peak 12.7 13.7    Symptoms No No    Resting HR 88 bpm 79 bpm    Resting BP 132/70 132/66    Max Ex. HR 126 bpm 101 bpm    Max Ex. BP 154/68 164/62       Quality of Life:     Quality of Life - 11/02/15 1027      Quality of Life Scores   Health/Function Post 27.86 %   Socioeconomic Post 30 %   Psych/Spiritual Post 25.71 %   Family Post 22.8 %   GLOBAL Post 27 %      Personal Goals: Goals established at orientation with interventions provided to work toward goal.     Personal Goals and Risk Factors at Admission - 09/12/15 1254      Core Components/Risk Factors/Patient Goals on Admission    Weight Management Weight Maintenance   Admit Weight 160 lb 3 oz (72.7 kg)   Expected Outcomes Weight Maintenance: Understanding of the daily nutrition  guidelines, which includes 25-35% calories from fat, 7% or less cal from saturated fats, less than 200mg  cholesterol, less than 1.5gm of sodium, & 5 or more servings of fruits and vegetables daily   Sedentary Yes   Intervention Provide advice, education, support and counseling about physical activity/exercise needs.;Develop an individualized exercise prescription for aerobic and resistive training based on initial evaluation findings, risk stratification, comorbidities and participant's personal goals.   Expected Outcomes Achievement of increased cardiorespiratory fitness and enhanced flexibility, muscular endurance and strength shown through measurements of functional capacity and personal statement of participant.   Increase Strength and Stamina Yes   Intervention Provide advice, education, support and counseling about physical activity/exercise needs.;Develop an individualized exercise prescription for aerobic and resistive training based on initial evaluation findings, risk stratification, comorbidities and participant's personal goals.   Expected Outcomes Achievement of increased cardiorespiratory fitness and enhanced flexibility, muscular endurance and strength shown through measurements of functional capacity and personal statement of participant.   Hypertension Yes   Intervention Provide education on lifestyle modifcations including regular physical activity/exercise, weight management, moderate sodium restriction and increased consumption of fresh fruit, vegetables, and low fat dairy, alcohol moderation, and smoking cessation.;Monitor prescription use compliance.   Expected Outcomes Short Term: Continued assessment and intervention until BP is < 140/72mm HG in hypertensive participants. < 130/59mm HG in hypertensive participants with diabetes, heart failure or chronic kidney disease.;Long Term: Maintenance of blood pressure at goal levels.   Lipids Yes   Intervention Provide education and support  for participant on nutrition & aerobic/resistive exercise along with prescribed medications to achieve LDL 70mg , HDL >40mg .   Expected Outcomes Short Term: Participant states understanding of desired cholesterol values and is compliant with medications prescribed. Participant is following exercise prescription and nutrition guidelines.;Long Term: Cholesterol controlled with medications as prescribed, with individualized exercise RX and with personalized nutrition plan. Value goals: LDL < 70mg , HDL > 40 mg.  Stress Yes   Intervention Offer individual and/or small group education and counseling on adjustment to heart disease, stress management and health-related lifestyle change. Teach and support self-help strategies.;Refer participants experiencing significant psychosocial distress to appropriate mental health specialists for further evaluation and treatment. When possible, include family members and significant others in education/counseling sessions.   Expected Outcomes Short Term: Participant demonstrates changes in health-related behavior, relaxation and other stress management skills, ability to obtain effective social support, and compliance with psychotropic medications if prescribed.;Long Term: Emotional wellbeing is indicated by absence of clinically significant psychosocial distress or social isolation.       Personal Goals Discharge:     Goals and Risk Factor Review - 10/19/15 1022      Core Components/Risk Factors/Patient Goals Review   Personal Goals Review Hypertension;Lipids;Stress;Sedentary;Increase Strength and Stamina   Review Laurieann is back to the gym and has restarted her classes without any problems.  She is planning to go back to watching her grandkids upon completion of the program at the end of the month.  She said that she feels better than she did before!  Her blood pressures have been good and she has reduced her stress levels.   Expected Outcomes Annyka will continue to  come to exercise and education classes.  We will continue to monitor her blood pressures.      Nutrition & Weight - Outcomes:     Pre Biometrics - 09/12/15 1250      Pre Biometrics   Height 5\' 8"  (1.727 m)   Weight 160 lb 4.8 oz (72.7 kg)   Waist Circumference 33.13 inches   Hip Circumference 41.25 inches   Waist to Hip Ratio 0.8 %   BMI (Calculated) 24.4       Nutrition:     Nutrition Therapy & Goals - 09/19/15 1032      Nutrition Therapy   Diet Instructed on  heart healthy ways to increase calories to prevent further weight loss. Placed emphasis on adequate protein. Patient has a weight goal of 165 lbs.     Drug/Food Interactions Statins/Certain Fruits   Protein (specify units) 8   Fiber 20 grams   Whole Grain Foods 3 servings   Saturated Fats 12 max. grams   Fruits and Vegetables 5 servings/day   Sodium 1500 grams     Personal Nutrition Goals   Personal Goal #1 Include 2-3 small snacks in addition to meals such as peanut butter with crackers or apple or pecans (patient has these available).   Personal Goal #2 Include a protein source at all meals with goal of 8 oz/day of protein or protein equivalent (Refer to list given).   Personal Goal #3 Consider adding a trans-free margarine to any appropriate foods to help increase calories.     Intervention Plan   Intervention Prescribe, educate and counsel regarding individualized specific dietary modifications aiming towards targeted core components such as weight, hypertension, lipid management, diabetes, heart failure and other comorbidities.;Nutrition handout(s) given to patient.   Expected Outcomes Short Term Goal: Understand basic principles of dietary content, such as calories, fat, sodium, cholesterol and nutrients.;Short Term Goal: A plan has been developed with personal nutrition goals set during dietitian appointment.;Long Term Goal: Adherence to prescribed nutrition plan.      Nutrition Discharge:     Nutrition  Assessments - 11/02/15 1027      Rate Your Plate Scores   Pre Score 74   Pre Score % 82.2 %      Education  Questionnaire Score:     Knowledge Questionnaire Score - 11/02/15 1026      Knowledge Questionnaire Score   Post Score 28      Goals reviewed with patient; copy given to patient.

## 2015-11-07 NOTE — Progress Notes (Signed)
Daily Session Note  Patient Details  Name: Heidi Middleton MRN: [id number] Date of Birth: March 21, 1945 Referring Provider:   Flowsheet Row Cardiac Rehab from 09/12/2015 in [facility]  Referring Provider  [name]      Encounter Date: 11/07/2015  Check In:     Session Check In - 11/07/15 0811      Check-In   Location [facility]-Cardiac & Pulmonary Rehab   Staff Present Heath Lark, RN, BSN, Laveda Norman, BS, ACSM CEP, Exercise Physiologist;Jessica [place], [place], ACSM RCEP, Exercise Physiologist   Supervising physician immediately available to respond to emergencies See telemetry face sheet for immediately available ER MD   Medication changes reported     No   Fall or balance concerns reported    No   Warm-up and Cool-down Performed on first and last piece of equipment   Resistance Training Performed Yes   VAD Patient? No     Pain Assessment   Currently in Pain? No/denies   Multiple Pain Sites No         Goals Met:  Independence with exercise equipment Exercise tolerated well No report of cardiac concerns or symptoms Strength training completed today  Goals Unmet:  Not Applicable  Comments: Pt able to follow exercise prescription today without complaint.  Will continue to monitor for progression.    Dr. Emily Filbert is Medical Director for [place] and [facility].

## 2015-11-13 ENCOUNTER — Other Ambulatory Visit: Payer: Self-pay | Admitting: Physician Assistant

## 2015-11-14 ENCOUNTER — Encounter: Payer: Self-pay | Admitting: *Deleted

## 2015-11-22 NOTE — Progress Notes (Signed)
Cardiology Office Note   Date:  11/30/2015   ID:  Heidi Middleton, DOB Aug 17, 1945, MRN [id number]  Referring Doctor:  Heidi Pardon, MD   Cardiologist:   Heidi Bushy, MD   Reason for consultation:  Chief Complaint  Patient presents with  . other    F/u echo. Meds reviewed verbally with pt.      History of Present Illness: Heidi Middleton is a 69 y.o. female who presents for s/p AVR 08/16/2015  Patient is doing very well. She is back to her routine physical activity of exercise sessions almost 7 times in a week. She has no chest pain and shortness of breath. She has no lightheadedness, syncope, palpitations.   No fever, cough, colds, abdominal pain. No orthopnea, edema, PND.  ROS:  Please see the history of present illness. Aside from mentioned under HPI, all other systems are reviewed and negative.    Past Medical History:  Diagnosis Date  . Aortic stenosis   . Colonic polyp   . GERD (gastroesophageal reflux disease)   . Headache    migraines prior to hysterectomy  . Hypertension   . PONV (postoperative nausea and vomiting)    nausea  . Shingles     Past Surgical History:  Procedure Laterality Date  . ABDOMINAL HYSTERECTOMY    . AORTIC VALVE REPLACEMENT N/A 08/16/2015   Procedure: AORTIC VALVE REPLACEMENT (AVR);  Surgeon: Ivin Poot, MD;  Location: [facility];  Service: Open Heart Surgery;  Laterality: N/A;  . APPENDECTOMY     taken out with hysterectomy  . BREAST BIOPSY Right 1990   EXCISIONAL - NEG  . CARDIAC CATHETERIZATION Bilateral 08/04/2015   Procedure: Right/Left Heart Cath and Coronary Angiography;  Surgeon: Wellington Hampshire, MD;  Location: [facility];  Service: Cardiovascular;  Laterality: Bilateral;  . EYE SURGERY N/A    detatched retina  . KNEE SURGERY  10/2005   calcinosis - in office procedure  . TEE WITHOUT CARDIOVERSION N/A 08/16/2015   Procedure: TRANSESOPHAGEAL ECHOCARDIOGRAM (TEE);  Surgeon: Ivin Poot, MD;  Location: [facility];   Service: Open Heart Surgery;  Laterality: N/A;     reports that she quit smoking about 13 years ago. She has never used smokeless tobacco. She reports that she does not drink alcohol or use drugs.   family history includes Cancer in her brother, father, and mother; Diabetes in her maternal grandmother; Heart disease in her sister; Hypertension in her brother and brother; Lung cancer in her brother; Obesity in her sister; Stroke in her mother.   Current Outpatient Prescriptions  Medication Sig Dispense Refill  . acetaminophen (TYLENOL) 500 MG tablet Take 2 tablets (1,000 mg total) by mouth every 6 (six) hours as needed for mild pain or fever. 30 tablet 0  . aspirin EC 325 MG EC tablet Take 1 tablet (325 mg total) by mouth daily. 30 tablet 0  . atorvastatin (LIPITOR) 10 MG tablet TAKE 0.5 TABLETS (5 MG TOTAL) BY MOUTH DAILY. 45 tablet 3  . cholecalciferol (VITAMIN D) 1000 UNITS tablet Take 1,000 Units by mouth 2 (two) times daily.     Marland Kitchen esomeprazole (NEXIUM) 40 MG capsule TAKE 1 CAPSULE (40 MG TOTAL) BY MOUTH DAILY BEFORE BREAKFAST. 90 capsule 3  . lisinopril (PRINIVIL,ZESTRIL) 10 MG tablet Take 1 tablet (10 mg total) by mouth daily. 30 tablet 3  . metoprolol tartrate (LOPRESSOR) 25 MG tablet TAKE 1/2 TABLET TWICE A DAY 30 tablet 1  . Multiple Vitamin (MULTIVITAMIN)  tablet Take 1 tablet by mouth daily.    Marland Kitchen amoxicillin (AMOXIL) 500 MG tablet Take 4 tablets (2,000 mg total) by mouth once. Take 1 hour before dental procedure. 4 tablet 0   No current facility-administered medications for this visit.     Allergies: Omeprazole; Ranitidine hcl; and Codeine    PHYSICAL EXAM: VS:  BP 130/74 (BP Location: Left Arm, Patient Position: Sitting, Cuff Size: Normal)   Pulse 82   Ht 5\' 8"  (1.727 m)   Wt 160 lb (72.6 kg)   BMI 24.33 kg/m  , Body mass index is 24.33 kg/m. Wt Readings from Last 3 Encounters:  11/30/15 160 lb (72.6 kg)  09/14/15 160 lb (72.6 kg)  09/12/15 160 lb 4.8 oz (72.7 kg)      GENERAL:  well developed, well nourished, not in acute distress HEENT: normocephalic, pink conjunctivae, anicteric sclerae, no xanthelasma, normal dentition, oropharynx clear NECK:  no neck vein engorgement, JVP normal, no hepatojugular reflux, carotid upstroke brisk and symmetric, no bruit, no thyromegaly, no lymphadenopathy LUNGS:  good respiratory effort, clear to auscultation bilaterally CV:  PMI not displaced, no thrills, no lifts, S1 and S2 within normal limits, no palpable S3 or S4, , no rubs, no gallops, 2/6 systolic ejection murmur nonradiating. Sternotomy scar healing very well. ABD:  Soft, nontender, nondistended, normoactive bowel sounds, no abdominal aortic bruit, no hepatomegaly, no splenomegaly MS: nontender back, no kyphosis, no scoliosis, no joint deformities EXT:  2+ DP/PT pulses, no edema, no varicosities, no cyanosis, no clubbing SKIN: warm, nondiaphoretic, normal turgor, no ulcers NEUROPSYCH: alert, oriented to person, place, and time, sensory/motor grossly intact, normal mood, appropriate affect  Recent Labs: 06/20/2015: TSH 1.62 08/11/2015: ALT 22 08/17/2015: Magnesium 2.3 08/19/2015: BUN 16; Creatinine, Ser 0.91; Hemoglobin 9.4; Platelets 129; Potassium 3.7; Sodium 132   Lipid Panel    Component Value Date/Time   CHOL 177 06/20/2015 0826   TRIG 73.0 06/20/2015 0826   HDL 78.30 06/20/2015 0826   CHOLHDL 2 06/20/2015 0826   VLDL 14.6 06/20/2015 0826   LDLCALC 84 06/20/2015 0826     Other studies Reviewed:  EKG:  The ekg from 07/28/2015 was personally reviewed by me and it revealed sinus rhythm, 82 BPM, nonspecific ST abnormality. EKG from 09/01/2015 was personally reviewed by me and it reveals sinus rhythm, 74 BPM.  Additional studies/ records that were reviewed personally reviewed by me today include:  Echo 07/06/2015: Left ventricle: The cavity size was normal. There was focal basal  hypertrophy. Systolic function was vigorous. The estimated  ejection  fraction was in the range of 65% to 70%. Wall motion was  normal; there were no regional wall motion abnormalities. Doppler  parameters are consistent with abnormal left ventricular  relaxation (grade 1 diastolic dysfunction). - Aortic valve: There was moderate to likely severe stenosis.  Velocity ratio is 0.34. AV Vmax is 3.39m/s. AVA by Vmax is  0.9cm2. AVA by VTI is 1.0cm2 Valve area (VTI): 0.95 cm^2. Valve  area (Vmax): 0.98 cm^2. Valve area (Vmean): 1.07 cm^2. - Mitral valve: Mildly calcified annulus. No AI  [facility] 08/04/2015:  Prox RCA to Mid RCA lesion, 30% stenosed.  1. Mild nonobstructive coronary artery disease. 2. Severe aortic valve stenosis with a mean gradient of 51.6 mmHg with a valve area of 0.67. 3. Right heart catheterization showed normal pulmonary pressure, normal cardiac output and mildly elevated pulmonary wedge pressure.  Recommendations:  Aortic valve replacement.  Echo 10/05/2015: Left ventricle: The cavity size was normal. Systolic function was  normal. The estimated ejection fraction was in the range of 60%   to 65%. Wall motion was normal; there were no regional wall   motion abnormalities. Doppler parameters are consistent with   abnormal left ventricular relaxation (grade 1 diastolic   dysfunction). - Aortic valve: A bioprosthesis was present. Transvalvular velocity   was within the normal range. There was no stenosis. Peak gradient   (S): 26 mm Hg. - Mitral valve: There was mild regurgitation. - Left atrium: The atrium was normal in size. - Right ventricle: Systolic function was normal. - Pulmonary arteries: Systolic pressure was within the normal   range.   ASSESSMENT AND PLAN: severe aortic stenosis, s/p AVR bioprosthetic 36mm Edwards bovine pericardial valve 08/16/2015 Serial no [id number], model Magna ease Patient is doing very well postop. No evidence of congestive heart failure. Continue aspirin 325 mg daily, beta blocker, ACE  inhibitor, statin therapy. Likely decrease aspirin to 81 mg by mouth daily after 3 months post surgery/per surgery recommendation Referred to cardiac rehabilitation Most recent echo is unremarkable with normally functioning bioprosthetic aortic valve. Antibiotic prophylaxis prior to dental procedure.  CAD Nonobstructive, Mild disease She has no angina or shortness of breath. Ejection fraction is preserved. Continue aspirin, beta blocker, ACE inhibitor, statin therapy. Ideal LDL goal is less than 70. Patient is going to resume her fish oil.  HTN BP is well controlled. Continue monitoring BP. Continue current medical therapy and lifestyle changes.  Current medicines are reviewed at length with the patient today.  The patient does not have concerns regarding medicines.  Labs/ tests ordered today include:  No orders of the defined types were placed in this encounter.   I had a lengthy and detailed discussion with the patient regarding diagnoses, prognosis, diagnostic options, treatment options , and side effects of medications.   Disposition:   FU with undersigned in 36months     Signed, Heidi Bushy, MD  11/30/2015 1:34 PM     [facility]

## 2015-11-30 ENCOUNTER — Encounter: Payer: Self-pay | Admitting: *Deleted

## 2015-11-30 ENCOUNTER — Encounter: Payer: Self-pay | Admitting: Cardiology

## 2015-11-30 ENCOUNTER — Ambulatory Visit (INDEPENDENT_AMBULATORY_CARE_PROVIDER_SITE_OTHER): Payer: Medicare Other | Admitting: Cardiology

## 2015-11-30 VITALS — BP 130/74 | HR 82 | Ht 68.0 in | Wt 160.0 lb

## 2015-11-30 DIAGNOSIS — Z954 Presence of other heart-valve replacement: Secondary | ICD-10-CM

## 2015-11-30 DIAGNOSIS — I10 Essential (primary) hypertension: Secondary | ICD-10-CM | POA: Diagnosis not present

## 2015-11-30 DIAGNOSIS — Z952 Presence of prosthetic heart valve: Secondary | ICD-10-CM

## 2015-11-30 MED ORDER — AMOXICILLIN 500 MG PO TABS: 2000.0000 mg | ORAL_TABLET | Freq: Once | ORAL | 0 refills | Status: AC

## 2015-11-30 NOTE — Patient Instructions (Signed)
Medication Instructions:  Your physician has recommended you make the following change in your medication:  1. Take Amoxicillin 1 hour prior to dental procedure.   Follow-Up: Your physician wants you to follow-up in: 6 months with Dr. Yvone Neu. You will receive a reminder letter in the mail two months in advance. If you don't receive a letter, please call our office to schedule the follow-up appointment.  It was a pleasure seeing you today here in the office. Please do not hesitate to give [place] a call back if you have any further questions. [place], BSN

## 2015-11-30 NOTE — Progress Notes (Signed)
Discharge Summary  Patient Details  Name: Heidi Middleton MRN: [id number] Date of Birth: January 07, 1946 Referring Provider:   Flowsheet Row Cardiac Rehab from 09/12/2015 in [facility]  Referring Provider  Heidi Middleton       Number of Visits: 36  Reason for Discharge:  Patient reached a stable level of exercise. Patient independent in their exercise.  Smoking History:  History  Smoking Status  . Former Smoker  . Quit date: 07/07/2002  Smokeless Tobacco  . Never Used    Diagnosis:  No diagnosis found.  ADL UCSD:   Initial Exercise Prescription:   Discharge Exercise Prescription (Final Exercise Prescription Changes):     Exercise Prescription Changes - 10/26/15 1400      Exercise Review   Progression Yes     Response to Exercise   Blood Pressure (Admit) 118/60   Blood Pressure (Exercise) 142/70   Blood Pressure (Exit) 118/60   Heart Rate (Admit) 78 bpm   Heart Rate (Exercise) 107 bpm   Heart Rate (Exit) 87 bpm   Rating of Perceived Exertion (Exercise) 13   Symptoms none   Comments Home Exercise given 10/07/15   Duration Progress to 45 minutes of aerobic exercise without signs/symptoms of physical distress   Intensity THRR unchanged     Progression   Progression Continue to progress workloads to maintain intensity without signs/symptoms of physical distress.   Average METs 5.06     Resistance Training   Training Prescription Yes   Weight 3 lbs   Reps 10-15     Interval Training   Interval Training No     Treadmill   MPH 3.3   Grade 3   Minutes 15   METs 4.89     NuStep   Level 4   Minutes 15   METs 3.5     REL-XR   Level 4   Minutes 15   METs 6.3     Home Exercise Plan   Plans to continue exercise at [employer] (comment)  walking, [employer], [employer]   Frequency Add 2 additional days to program exercise sessions.      Functional Capacity:     6 Minute Walk    Row Name 10/31/15 0953         6 Minute Walk   Phase Discharge     Distance 1646 feet     Distance % Change 20.6 %     Walk Time 6 minutes     # of Rest Breaks 0     MPH 3.12     METS 3.9     RPE 13     VO2 Peak 13.7     Symptoms No     Resting HR 79 bpm     Resting BP 132/66     Max Ex. HR 101 bpm     Max Ex. BP 164/62        Psychological, QOL, Others - Outcomes: PHQ 2/9: Depression screen [facility] 2/9 11/02/2015 09/12/2015 06/20/2015 06/21/2014 06/15/2013  Decreased Interest 0 1 0 0 0  Down, Depressed, Hopeless 0 1 0 0 0  PHQ - 2 Score 0 2 0 0 0  Altered sleeping 0 3 - - -  Tired, decreased energy 0 2 - - -  Change in appetite 0 3 - - -  Feeling bad or failure about yourself  0 1 - - -  Trouble concentrating 1 3 - - -  Moving slowly or fidgety/restless 0 0 - - -  Suicidal thoughts 0 0 - - -  PHQ-9 Score 1 14 - - -  Difficult doing work/chores - Somewhat difficult - - -    Quality of Life:     Quality of Life - 11/02/15 1027      Quality of Life Scores   Health/Function Post 27.86 %   Socioeconomic Post 30 %   Psych/Spiritual Post 25.71 %   Family Post 22.8 %   GLOBAL Post 27 %      Personal Goals: Goals established at orientation with interventions provided to work toward goal.    Personal Goals Discharge:     Goals and Risk Factor Review    Row Name 10/19/15 1022             Core Components/Risk Factors/Patient Goals Review   Personal Goals Review Hypertension;Lipids;Stress;Sedentary;Increase Strength and Stamina       Review Heidi Middleton is back to the gym and has restarted her classes without any problems.  She is planning to go back to watching her grandkids upon completion of the program at the end of the month.  She said that she feels better than she did before!  Her blood pressures have been good and she has reduced her stress levels.       Expected Outcomes Heidi Middleton will continue to come to exercise and education classes.  We will continue to monitor her blood pressures.          Nutrition & Weight -  Outcomes:    Nutrition:   Nutrition Discharge:     Nutrition Assessments - 11/02/15 1027      Rate Your Plate Scores   Pre Score 74   Pre Score % 82.2 %      Education Questionnaire Score:     Knowledge Questionnaire Score - 11/02/15 1026      Knowledge Questionnaire Score   Post Score 28      Goals reviewed with patient; copy given to patient.

## 2015-12-09 ENCOUNTER — Ambulatory Visit (INDEPENDENT_AMBULATORY_CARE_PROVIDER_SITE_OTHER): Payer: Medicare Other

## 2015-12-09 DIAGNOSIS — Z23 Encounter for immunization: Secondary | ICD-10-CM | POA: Diagnosis not present

## 2015-12-11 ENCOUNTER — Other Ambulatory Visit: Payer: Self-pay | Admitting: Physician Assistant

## 2015-12-12 ENCOUNTER — Other Ambulatory Visit: Payer: Self-pay | Admitting: Cardiology

## 2015-12-14 ENCOUNTER — Telehealth: Payer: Self-pay | Admitting: Cardiology

## 2015-12-14 MED ORDER — METOPROLOL TARTRATE 25 MG PO TABS: 12.5000 mg | ORAL_TABLET | Freq: Two times a day (BID) | ORAL | 3 refills | Status: DC

## 2015-12-14 MED ORDER — METOPROLOL TARTRATE 25 MG PO TABS: 12.5000 mg | ORAL_TABLET | Freq: Two times a day (BID) | ORAL | 6 refills | Status: DC

## 2015-12-14 MED ORDER — LISINOPRIL 10 MG PO TABS: 10.0000 mg | ORAL_TABLET | Freq: Every day | ORAL | 3 refills | Status: DC

## 2015-12-14 NOTE — Telephone Encounter (Signed)
Spoke with patient and notified her that refills were sent in for her medications. She is no longer having to see Dr. Servando Snare and needed a refill for metoprolol as well. Let her know that prescriptions were sent in for both and if she had any further questions or needs to give [place] a call back. She verbalized understanding and had no further questions.

## 2015-12-14 NOTE — Telephone Encounter (Signed)
Pt c/o medication issue:  1. Name of Medication:  Metoprolol and Lisinprolol   2. How are you currently taking this medication (dosage and times per day)?   3. Are you having a reaction (difficulty breathing--STAT)? No   4. What is your medication issue? Not sure if she is to get a refill on metoprolol. She has the bottle and it says only take for 75m  Just would like to make sure what is she to take.

## 2015-12-14 NOTE — Telephone Encounter (Signed)
Can you review for refill.  Dr. Yvone Neu didn't give the Rx but looks as if the cardiovascular surgeon did.  Do you think [facility] will refill. Thanks.

## 2015-12-14 NOTE — Telephone Encounter (Signed)
shd be ok

## 2016-04-12 ENCOUNTER — Other Ambulatory Visit: Payer: Self-pay | Admitting: Family Medicine

## 2016-04-12 DIAGNOSIS — Z1231 Encounter for screening mammogram for malignant neoplasm of breast: Secondary | ICD-10-CM

## 2016-05-29 ENCOUNTER — Encounter: Payer: Self-pay | Admitting: Cardiology

## 2016-05-29 ENCOUNTER — Ambulatory Visit (INDEPENDENT_AMBULATORY_CARE_PROVIDER_SITE_OTHER): Payer: Medicare Other | Admitting: Cardiology

## 2016-05-29 VITALS — BP 124/64 | HR 91 | Ht 68.0 in | Wt 168.0 lb

## 2016-05-29 DIAGNOSIS — I10 Essential (primary) hypertension: Secondary | ICD-10-CM

## 2016-05-29 DIAGNOSIS — Z952 Presence of prosthetic heart valve: Secondary | ICD-10-CM | POA: Diagnosis not present

## 2016-05-29 DIAGNOSIS — I350 Nonrheumatic aortic (valve) stenosis: Secondary | ICD-10-CM | POA: Diagnosis not present

## 2016-05-29 NOTE — Patient Instructions (Signed)
Testing/Procedures: Your physician has requested that you have an echocardiogram. Echocardiography is a painless test that uses sound waves to create images of your heart. It provides your doctor with information about the size and shape of your heart and how well your heart's chambers and valves are working. This procedure takes approximately one hour. There are no restrictions for this procedure.    Follow-Up: Your physician wants you to follow-up in: 6 months. You will receive a reminder letter in the mail two months in advance. If you don't receive a letter, please call our office to schedule the follow-up appointment.  It was a pleasure seeing you today here in the office. Please do not hesitate to give [place] a call back if you have any further questions. [place], BSN

## 2016-05-29 NOTE — Progress Notes (Signed)
Cardiology Office Note   Date:  05/29/2016   ID:  Heidi Middleton, DOB 1945/03/21, MRN [id number]  Referring Doctor:  Loura Pardon, MD   Cardiologist:   Wende Bushy, MD   Reason for consultation:  Chief Complaint  Patient presents with  . other    22mo f/u. Pt c/o sob, feeling lightheaded. Reviewed meds with pt verbally.      History of Present Illness: Heidi Middleton is a 71 y.o. female who presents for s/p AVR 08/16/2015  Patient reports that for the last 2 months, she has noticed increased shortness of breath compared to right after surgery. She is feeling very well and was back to her regular exercises. Over the last 2 months, she's noticed getting more short of breath with walking and incline, and doing her aerobics. However, not to the extent that she was having before she had aortic valve replacement.  Patient does report that she is following him healthy low-sodium diet. She eats a bowl of cereal was in the morning, soup and half sandwich for lunch, and there is small meal at nighttime. She drinks up to 5 L of water a day probably.  Patient denies chest pain, PND, orthopnea, edema. No syncope  ROS:  Please see the history of present illness. Aside from mentioned under HPI, all other systems are reviewed and negative.    Past Medical History:  Diagnosis Date  . Aortic stenosis   . Colonic polyp   . GERD (gastroesophageal reflux disease)   . Headache    migraines prior to hysterectomy  . Hypertension   . PONV (postoperative nausea and vomiting)    nausea  . Shingles     Past Surgical History:  Procedure Laterality Date  . ABDOMINAL HYSTERECTOMY    . AORTIC VALVE REPLACEMENT N/A 08/16/2015   Procedure: AORTIC VALVE REPLACEMENT (AVR);  Surgeon: Ivin Poot, MD;  Location: [facility];  Service: Open Heart Surgery;  Laterality: N/A;  . APPENDECTOMY     taken out with hysterectomy  . BREAST BIOPSY Right 1990   EXCISIONAL - NEG  . CARDIAC CATHETERIZATION Bilateral  08/04/2015   Procedure: Right/Left Heart Cath and Coronary Angiography;  Surgeon: Wellington Hampshire, MD;  Location: [facility];  Service: Cardiovascular;  Laterality: Bilateral;  . EYE SURGERY N/A    detatched retina  . KNEE SURGERY  10/2005   calcinosis - in office procedure  . TEE WITHOUT CARDIOVERSION N/A 08/16/2015   Procedure: TRANSESOPHAGEAL ECHOCARDIOGRAM (TEE);  Surgeon: Ivin Poot, MD;  Location: [facility];  Service: Open Heart Surgery;  Laterality: N/A;     reports that she quit smoking about 13 years ago. She has never used smokeless tobacco. She reports that she does not drink alcohol or use drugs.   family history includes Cancer in her brother, father, and mother; Diabetes in her maternal grandmother; Heart disease in her sister; Hypertension in her brother and brother; Lung cancer in her brother; Obesity in her sister; Stroke in her mother.   Current Outpatient Prescriptions  Medication Sig Dispense Refill  . acetaminophen (TYLENOL) 500 MG tablet Take 2 tablets (1,000 mg total) by mouth every 6 (six) hours as needed for mild pain or fever. 30 tablet 0  . aspirin EC 325 MG EC tablet Take 1 tablet (325 mg total) by mouth daily. 30 tablet 0  . atorvastatin (LIPITOR) 10 MG tablet TAKE 0.5 TABLETS (5 MG TOTAL) BY MOUTH DAILY. 45 tablet 3  . cholecalciferol (  VITAMIN D) 1000 UNITS tablet Take 1,000 Units by mouth 2 (two) times daily.     Marland Kitchen esomeprazole (NEXIUM) 40 MG capsule TAKE 1 CAPSULE (40 MG TOTAL) BY MOUTH DAILY BEFORE BREAKFAST. 90 capsule 3  . lisinopril (PRINIVIL,ZESTRIL) 10 MG tablet Take 1 tablet (10 mg total) by mouth daily. 30 tablet 3  . metoprolol tartrate (LOPRESSOR) 25 MG tablet Take 0.5 tablets (12.5 mg total) by mouth 2 (two) times daily. 30 tablet 6  . Multiple Vitamin (MULTIVITAMIN) tablet Take 1 tablet by mouth daily.    . Omega-3 Fatty Acids (FISH OIL) 1000 MG CPDR Take 1,000 mg by mouth 2 (two) times daily.     No current facility-administered  medications for this visit.     Allergies: Omeprazole; Ranitidine hcl; and Codeine    PHYSICAL EXAM: VS:  BP 124/64 (BP Location: Left Arm, Patient Position: Sitting, Cuff Size: Normal)   Pulse 91   Ht 5\' 8"  (1.727 m)   Wt 168 lb (76.2 kg)   BMI 25.54 kg/m  , Body mass index is 25.54 kg/m. Wt Readings from Last 3 Encounters:  05/29/16 168 lb (76.2 kg)  11/30/15 160 lb (72.6 kg)  09/14/15 160 lb (72.6 kg)    Orthostatic VS for the past 24 hrs:  BP- Lying Pulse- Lying BP- Sitting Pulse- Sitting BP- Standing at 0 minutes Pulse- Standing at 0 minutes  05/29/16 1317 120/58 87 110/66 95 102/70 101    GENERAL:  well developed, well nourished, not in acute distress HEENT: normocephalic, pink conjunctivae, anicteric sclerae, no xanthelasma, normal dentition, oropharynx clear NECK:  no neck vein engorgement, JVP normal, no hepatojugular reflux, carotid upstroke brisk and symmetric, no bruit, no thyromegaly, no lymphadenopathy LUNGS:  good respiratory effort, clear to auscultation bilaterally CV:  PMI not displaced, no thrills, no lifts, S1 and S2 within normal limits, no palpable S3 or S4, no murmurs, no rubs, no gallops ABD:  Soft, nontender, nondistended, normoactive bowel sounds, no abdominal aortic bruit, no hepatomegaly, no splenomegaly MS: nontender back, no kyphosis, no scoliosis, no joint deformities EXT:  2+ DP/PT pulses, no edema, no varicosities, no cyanosis, no clubbing SKIN: warm, nondiaphoretic, normal turgor, no ulcers NEUROPSYCH: alert, oriented to person, place, and time, sensory/motor grossly intact, normal mood, appropriate affect   Recent Labs: 06/20/2015: TSH 1.62 08/11/2015: ALT 22 08/17/2015: Magnesium 2.3 08/19/2015: BUN 16; Creatinine, Ser 0.91; Hemoglobin 9.4; Platelets 129; Potassium 3.7; Sodium 132   Lipid Panel    Component Value Date/Time   CHOL 177 06/20/2015 0826   TRIG 73.0 06/20/2015 0826   HDL 78.30 06/20/2015 0826   CHOLHDL 2 06/20/2015 0826   VLDL  14.6 06/20/2015 0826   LDLCALC 84 06/20/2015 0826     Other studies Reviewed:  EKG:  The ekg from 07/28/2015 was personally reviewed by me and it revealed sinus rhythm, 82 BPM, nonspecific ST abnormality. EKG from 09/01/2015 was personally reviewed by me and it reveals sinus rhythm, 74 BPM. EKG from 05/29/2016 was personally reviewed by me and it revealed sinus rhythm, 91 BPM. Possible LVH, nonspecific ST-T wave changes - likely rate related when compared to previous EKGs.  Additional studies/ records that were reviewed personally reviewed by me today include:  Echo 07/06/2015: Left ventricle: The cavity size was normal. There was focal basal  hypertrophy. Systolic function was vigorous. The estimated  ejection fraction was in the range of 65% to 70%. Wall motion was  normal; there were no regional wall motion abnormalities. Doppler  parameters are  consistent with abnormal left ventricular  relaxation (grade 1 diastolic dysfunction). - Aortic valve: There was moderate to likely severe stenosis.  Velocity ratio is 0.34. AV Vmax is 3.46m/s. AVA by Vmax is  0.9cm2. AVA by VTI is 1.0cm2 Valve area (VTI): 0.95 cm^2. Valve  area (Vmax): 0.98 cm^2. Valve area (Vmean): 1.07 cm^2. - Mitral valve: Mildly calcified annulus. No AI  [facility] 08/04/2015:  Prox RCA to Mid RCA lesion, 30% stenosed.  1. Mild nonobstructive coronary artery disease. 2. Severe aortic valve stenosis with a mean gradient of 51.6 mmHg with a valve area of 0.67. 3. Right heart catheterization showed normal pulmonary pressure, normal cardiac output and mildly elevated pulmonary wedge pressure.  Recommendations:  Aortic valve replacement.  Echo 10/05/2015: Left ventricle: The cavity size was normal. Systolic function was   normal. The estimated ejection fraction was in the range of 60%   to 65%. Wall motion was normal; there were no regional wall   motion abnormalities. Doppler parameters are consistent with    abnormal left ventricular relaxation (grade 1 diastolic   dysfunction). - Aortic valve: A bioprosthesis was present. Transvalvular velocity   was within the normal range. There was no stenosis. Peak gradient   (S): 26 mm Hg. - Mitral valve: There was mild regurgitation. - Left atrium: The atrium was normal in size. - Right ventricle: Systolic function was normal. - Pulmonary arteries: Systolic pressure was within the normal   range.   ASSESSMENT AND PLAN: Shortness of breath Lightheadedness  Evidence of orthostasis on exam We discussed her diet and it could be that she may be eating relatively very little compared to the amount of water/fluid that she takes in Advised improving diet -- can accomodate more sodium, increase freq or amount,  and maybe drinking less water than what she is doing now. Check echo Check bmp  severe aortic stenosis, s/p AVR bioprosthetic 45mm Edwards bovine pericardial valve 08/16/2015 Serial no [id number], model Magna ease No evidence of CHF. Continue aspirin 81 mg by mouth daily now, ACE inhibitor, statin therapy, beta blocker. Recheck echo today. Antibiotic prophylaxis prior to dental procedure.  CAD Nonobstructive, Mild disease We will recheck echo today. Continue medical therapy.  HTN BP is well controlled. Measurements at home are in the 035D to 974B systolic. Continue monitoring BP. Continue current medical therapy and lifestyle changes.  Current medicines are reviewed at length with the patient today.  The patient does not have concerns regarding medicines.  Labs/ tests ordered today include:  Orders Placed This Encounter  Procedures  . Basic metabolic panel  . EKG 12-Lead  . ECHOCARDIOGRAM COMPLETE    I had a lengthy and detailed discussion with the patient regarding diagnoses, prognosis, diagnostic options, treatment options , and side effects of medications.   Disposition:   FU with undersigned in 26months or sooner       Signed, Wende Bushy, MD  05/29/2016 3:42 PM    [place]

## 2016-05-30 LAB — BASIC METABOLIC PANEL
BUN/Creatinine Ratio: 11 — ABNORMAL LOW (ref 12–28)
BUN: 15 mg/dL (ref 8–27)
CO2: 24 mmol/L (ref 18–29)
Calcium: 10.1 mg/dL (ref 8.7–10.3)
Chloride: 98 mmol/L (ref 96–106)
Creatinine, Ser: 1.31 mg/dL — ABNORMAL HIGH (ref 0.57–1.00)
GFR calc Af Amer: 47 mL/min/{1.73_m2} — ABNORMAL LOW (ref >59)
GFR calc non Af Amer: 41 mL/min/{1.73_m2} — ABNORMAL LOW (ref >59)
Glucose: 105 mg/dL — ABNORMAL HIGH (ref 65–99)
Potassium: 4.4 mmol/L (ref 3.5–5.2)
Sodium: 137 mmol/L (ref 134–144)

## 2016-05-31 ENCOUNTER — Ambulatory Visit
Admission: RE | Admit: 2016-05-31 | Discharge: 2016-05-31 | Disposition: A | Discharge status: Home / Self Care | Payer: Medicare Other | Source: Ambulatory Visit | Attending: Family Medicine | Admitting: Family Medicine

## 2016-05-31 DIAGNOSIS — Z1231 Encounter for screening mammogram for malignant neoplasm of breast: Secondary | ICD-10-CM | POA: Insufficient documentation

## 2016-06-01 NOTE — Telephone Encounter (Signed)
Patient returning call from [facility].  Please call again.

## 2016-06-01 NOTE — Telephone Encounter (Signed)
Spoke w/ pt.  Reiterated previous conversation w/ her.  She is appreciative and will call back w/ any further questions or concerns.

## 2016-06-05 ENCOUNTER — Telehealth: Payer: Self-pay | Admitting: *Deleted

## 2016-06-05 MED ORDER — LISINOPRIL 5 MG PO TABS: 5.0000 mg | ORAL_TABLET | Freq: Every day | ORAL | 3 refills | Status: DC

## 2016-06-05 NOTE — Telephone Encounter (Signed)
Reviewed results and recommendations with patient and she verbalized understanding with no further questions at this time. Instructed her to take lisinopril 5 mg once daily and that I sent in new prescription with those instructions. She was appreciative for the call and had no further questions at this time.

## 2016-06-05 NOTE — Telephone Encounter (Signed)
-----   Message from Wende Bushy, MD sent at 05/31/2016 10:03 AM EDT ----- Sodium is ok. Crea is higher than before. K ok.  decrease lisinopril to 5mg  po qd. BP monitoring.  Rpt bmp in 1 week.  Continue hydration.

## 2016-06-10 ENCOUNTER — Telehealth: Payer: Self-pay | Admitting: Family Medicine

## 2016-06-10 DIAGNOSIS — Z0000 Encounter for general adult medical examination without abnormal findings: Secondary | ICD-10-CM

## 2016-06-10 NOTE — Telephone Encounter (Signed)
-----   Message from Eustace Pen, LPN sent at 4/60/0298 12:22 AM EDT ----- Regarding: Labs 4/3 Please place lab orders. Thank you.

## 2016-06-12 ENCOUNTER — Other Ambulatory Visit (INDEPENDENT_AMBULATORY_CARE_PROVIDER_SITE_OTHER): Payer: Medicare Other

## 2016-06-12 DIAGNOSIS — I350 Nonrheumatic aortic (valve) stenosis: Secondary | ICD-10-CM | POA: Diagnosis not present

## 2016-06-13 LAB — BASIC METABOLIC PANEL
BUN/Creatinine Ratio: 14 (ref 12–28)
BUN: 14 mg/dL (ref 8–27)
CO2: 24 mmol/L (ref 18–29)
Calcium: 10.2 mg/dL (ref 8.7–10.3)
Chloride: 101 mmol/L (ref 96–106)
Creatinine, Ser: 0.98 mg/dL (ref 0.57–1.00)
GFR calc Af Amer: 67 mL/min/{1.73_m2} (ref >59)
GFR calc non Af Amer: 58 mL/min/{1.73_m2} — ABNORMAL LOW (ref >59)
Glucose: 100 mg/dL — ABNORMAL HIGH (ref 65–99)
Potassium: 4.6 mmol/L (ref 3.5–5.2)
Sodium: 141 mmol/L (ref 134–144)

## 2016-06-19 ENCOUNTER — Other Ambulatory Visit: Payer: Medicare Other

## 2016-06-19 ENCOUNTER — Ambulatory Visit (INDEPENDENT_AMBULATORY_CARE_PROVIDER_SITE_OTHER): Payer: Medicare Other

## 2016-06-19 VITALS — BP 122/78 | HR 64 | Temp 98.1°F | Ht 67.25 in | Wt 167.2 lb

## 2016-06-19 DIAGNOSIS — Z0000 Encounter for general adult medical examination without abnormal findings: Secondary | ICD-10-CM | POA: Diagnosis not present

## 2016-06-19 LAB — CBC WITH DIFFERENTIAL/PLATELET
Basophils Absolute: 0.1 10*3/uL (ref 0.0–0.1)
Basophils Relative: 1.7 % (ref 0.0–3.0)
Eosinophils Absolute: 0.1 10*3/uL (ref 0.0–0.7)
Eosinophils Relative: 2.2 % (ref 0.0–5.0)
HCT: 38.5 % (ref 36.0–46.0)
Hemoglobin: 12.7 g/dL (ref 12.0–15.0)
Lymphocytes Relative: 27.3 % (ref 12.0–46.0)
Lymphs Abs: 1.5 10*3/uL (ref 0.7–4.0)
MCHC: 33.1 g/dL (ref 30.0–36.0)
MCV: 89.7 fl (ref 78.0–100.0)
Monocytes Absolute: 0.5 10*3/uL (ref 0.1–1.0)
Monocytes Relative: 9.4 % (ref 3.0–12.0)
Neutro Abs: 3.2 10*3/uL (ref 1.4–7.7)
Neutrophils Relative %: 59.4 % (ref 43.0–77.0)
Platelets: 245 10*3/uL (ref 150.0–400.0)
RBC: 4.29 Mil/uL (ref 3.87–5.11)
RDW: 13.8 % (ref 11.5–15.5)
WBC: 5.5 10*3/uL (ref 4.0–10.5)

## 2016-06-19 LAB — COMPREHENSIVE METABOLIC PANEL
ALT: 16 U/L (ref 0–35)
AST: 25 U/L (ref 0–37)
Albumin: 4.2 g/dL (ref 3.5–5.2)
Alkaline Phosphatase: 76 U/L (ref 39–117)
BUN: 17 mg/dL (ref 6–23)
CO2: 30 mEq/L (ref 19–32)
Calcium: 9.9 mg/dL (ref 8.4–10.5)
Chloride: 105 mEq/L (ref 96–112)
Creatinine, Ser: 0.99 mg/dL (ref 0.40–1.20)
GFR: 58.64 mL/min — ABNORMAL LOW (ref >60.00)
Glucose, Bld: 100 mg/dL — ABNORMAL HIGH (ref 70–99)
Potassium: 4.7 mEq/L (ref 3.5–5.1)
Sodium: 140 mEq/L (ref 135–145)
Total Bilirubin: 0.6 mg/dL (ref 0.2–1.2)
Total Protein: 7.2 g/dL (ref 6.0–8.3)

## 2016-06-19 LAB — LIPID PANEL
Cholesterol: 181 mg/dL (ref 0–200)
HDL: 77.5 mg/dL (ref >39.00)
LDL Cholesterol: 89 mg/dL (ref 0–99)
NonHDL: 103.02
Total CHOL/HDL Ratio: 2
Triglycerides: 72 mg/dL (ref 0.0–149.0)
VLDL: 14.4 mg/dL (ref 0.0–40.0)

## 2016-06-19 LAB — TSH: TSH: 1.5 u[IU]/mL (ref 0.35–4.50)

## 2016-06-19 NOTE — Progress Notes (Signed)
Subjective:   Heidi Middleton is a 71 y.o. female who presents for Medicare Annual (Subsequent) preventive examination.  Review of Systems:  N/A Cardiac Risk Factors include: advanced age (>95men, >88 women);dyslipidemia;hypertension     Objective:     Vitals: BP 122/78 (BP Location: Left Arm, Patient Position: Sitting, Cuff Size: Normal)   Pulse 64   Temp 98.1 F (36.7 C) (Oral)   Ht 5' 7.25" (1.708 m) Comment: no shoes  Wt 167 lb 4 oz (75.9 kg)   SpO2 97%   BMI 26.00 kg/m   Body mass index is 26 kg/m.   Tobacco History  Smoking Status  . Former Smoker  . Quit date: 07/07/2002  Smokeless Tobacco  . Never Used     Counseling given: No   Past Medical History:  Diagnosis Date  . Aortic stenosis   . Colonic polyp   . GERD (gastroesophageal reflux disease)   . Headache    migraines prior to hysterectomy  . Hypertension   . PONV (postoperative nausea and vomiting)    nausea  . Shingles    Past Surgical History:  Procedure Laterality Date  . ABDOMINAL HYSTERECTOMY    . AORTIC VALVE REPLACEMENT N/A 08/16/2015   Procedure: AORTIC VALVE REPLACEMENT (AVR);  Surgeon: Ivin Poot, MD;  Location: [place];  Service: Open Heart Surgery;  Laterality: N/A;  . APPENDECTOMY     taken out with hysterectomy  . BREAST BIOPSY Right 1990   EXCISIONAL - NEG  . CARDIAC CATHETERIZATION Bilateral 08/04/2015   Procedure: Right/Left Heart Cath and Coronary Angiography;  Surgeon: Wellington Hampshire, MD;  Location: [place] [facility];  Service: Cardiovascular;  Laterality: Bilateral;  . EYE SURGERY N/A    detatched retina  . KNEE SURGERY  10/2005   calcinosis - in office procedure  . TEE WITHOUT CARDIOVERSION N/A 08/16/2015   Procedure: TRANSESOPHAGEAL ECHOCARDIOGRAM (TEE);  Surgeon: Ivin Poot, MD;  Location: [place];  Service: Open Heart Surgery;  Laterality: N/A;   Family History  Problem Relation Age of Onset  . Cancer Mother     lung  . Stroke Mother   . Cancer Father       pancreatic  . Heart disease Sister   . Hypertension Brother   . Cancer Brother   . Diabetes Maternal Grandmother   . Hypertension Brother   . Obesity Sister   . Lung cancer Brother   . Breast cancer Neg Hx    History  Sexual Activity  . Sexual activity: No    Outpatient Encounter Prescriptions as of 06/19/2016  Medication Sig  . acetaminophen (TYLENOL) 500 MG tablet Take 2 tablets (1,000 mg total) by mouth every 6 (six) hours as needed for mild pain or fever.  Marland Kitchen aspirin EC 325 MG EC tablet Take 1 tablet (325 mg total) by mouth daily.  Marland Kitchen atorvastatin (LIPITOR) 10 MG tablet TAKE 0.5 TABLETS (5 MG TOTAL) BY MOUTH DAILY.  . cholecalciferol (VITAMIN D) 1000 UNITS tablet Take 1,000 Units by mouth 2 (two) times daily.   Marland Kitchen esomeprazole (NEXIUM) 40 MG capsule TAKE 1 CAPSULE (40 MG TOTAL) BY MOUTH DAILY BEFORE BREAKFAST.  Marland Kitchen lisinopril (PRINIVIL,ZESTRIL) 5 MG tablet Take 1 tablet (5 mg total) by mouth daily.  . metoprolol tartrate (LOPRESSOR) 25 MG tablet Take 0.5 tablets (12.5 mg total) by mouth 2 (two) times daily.  . Multiple Vitamin (MULTIVITAMIN) tablet Take 1 tablet by mouth daily.  . Omega-3 Fatty Acids (FISH OIL) 1000 MG CPDR  Take 1,000 mg by mouth 2 (two) times daily.   No facility-administered encounter medications on file as of 06/19/2016.     Activities of Daily Living In your present state of health, do you have any difficulty performing the following activities: 06/19/2016 08/16/2015  Hearing? N N  Vision? N N  Difficulty concentrating or making decisions? N N  Walking or climbing stairs? Y N  Dressing or bathing? N N  Doing errands, shopping? N N  Preparing Food and eating ? N -  Using the Toilet? N -  In the past six months, have you accidently leaked urine? N -  Do you have problems with loss of bowel control? N -  Managing your Medications? N -  Managing your Finances? N -  Housekeeping or managing your Housekeeping? N -  Some recent data might be hidden    Patient  Care Team: Abner Greenspan, MD as PCP - General A. Joellyn Rued, MD as Consulting Physician (Ophthalmology) [place], DPM as Consulting Physician (Podiatry) Iris Pert, DC as Referring Physician (Chiropractic Medicine)    Assessment:     Hearing Screening   125Hz  250Hz  500Hz  1000Hz  2000Hz  3000Hz  4000Hz  6000Hz  8000Hz   Right ear:   40 40 40  40    Left ear:   40 40 40  40    Vision Screening Comments: Last vision exam in Oct 2017 with Dr. Chong Sicilian   Exercise Activities and Dietary recommendations Current Exercise Habits: Structured exercise class, Type of exercise: yoga;Other - see comments (aerobics, silver sneakers), Time (Minutes): 60, Frequency (Times/Week): 4, Weekly Exercise (Minutes/Week): 240, Intensity: Moderate, Exercise limited by: None identified  Goals    . Increase physical activity          Starting 06/19/2016, I will continue to exercise for 60 min 4 days a week.       Fall Risk Fall Risk  06/19/2016 09/12/2015 06/20/2015 06/21/2014 06/15/2013  Falls in the past year? No No Yes No No  Number falls in past yr: - - 2 or more - -  Injury with Fall? - - Yes - -  Risk Factor Category  - - High Fall Risk - -  Risk for fall due to : - Medication side effect - - -  Follow up - - Falls evaluation completed;Education provided - -   Depression Screen PHQ 2/9 Scores 06/19/2016 11/02/2015 09/12/2015 06/20/2015  PHQ - 2 Score 0 0 2 0  PHQ- 9 Score - 1 14 -     Cognitive Function MMSE - Mini Mental State Exam 06/19/2016 06/20/2015  Orientation to time 5 5  Orientation to Place 5 5  Registration 3 3  Attention/ Calculation 0 0  Recall 3 3  Language- name 2 objects 0 0  Language- repeat 1 1  Language- follow 3 step command 3 3  Language- read & follow direction 0 0  Write a sentence 0 0  Copy design 0 0  Total score 20 20     PLEASE NOTE: A Mini-Cog screen was completed. Maximum score is 20. A value of 0 denotes this part of Folstein MMSE was not completed or the patient failed this part  of the Mini-Cog screening.   Mini-Cog Screening Orientation to Time - Max 5 pts Orientation to Place - Max 5 pts Registration - Max 3 pts Recall - Max 3 pts Language Repeat - Max 1 pts Language Follow 3 Step Command - Max 3 pts     Immunization History  Administered Date(s) Administered  . Influenza Whole 01/23/2005, 01/18/2009, 01/31/2010  . Influenza,inj,Quad PF,36+ Mos 01/01/2013, 12/29/2013, 01/27/2015, 12/09/2015  . Influenza-Unspecified 01/04/2012  . Pneumococcal Conjugate-13 06/21/2014  . Pneumococcal Polysaccharide-23 03/09/2002, 01/18/2009, 06/24/2015  . Td 09/20/2004  . Tdap 06/21/2014  . Zoster 10/24/2006   Screening Tests Health Maintenance  Topic Date Due  . INFLUENZA VACCINE  10/17/2016  . MAMMOGRAM  05/31/2017  . COLONOSCOPY  10/27/2017  . TETANUS/TDAP  06/20/2024  . DEXA SCAN  Completed  . Hepatitis C Screening  Completed  . PNA vac Low Risk Adult  Completed      Plan:     I have personally reviewed and addressed the Medicare Annual Wellness questionnaire and have noted the following in the patient's chart:  A. Medical and social history B. Use of alcohol, tobacco or illicit drugs  C. Current medications and supplements D. Functional ability and status E.  Nutritional status F.  Physical activity G. Advance directives H. List of other physicians I.  Hospitalizations, surgeries, and ER visits in previous 12 months J.  [place] to include hearing, vision, cognitive, depression L. Referrals and appointments - none  In addition, I have reviewed and discussed with patient certain preventive protocols, quality metrics, and best practice recommendations. A written personalized care plan for preventive services as well as general preventive health recommendations were provided to patient.  See attached scanned questionnaire for additional information.   Signed,   Lindell Noe, MHA, BS, LPN Health Coach

## 2016-06-19 NOTE — Patient Instructions (Signed)
Heidi Middleton , Thank you for taking time to come for your Medicare Wellness Visit. I appreciate your ongoing commitment to your health goals. Please review the following plan we discussed and let me know if I can assist you in the future.   These are the goals we discussed: Goals    . Increase physical activity          Starting 06/19/2016, I will continue to exercise for 60 min 4 days a week.        This is a list of the screening recommended for you and due dates:  Health Maintenance  Topic Date Due  . Flu Shot  10/17/2016  . Mammogram  05/31/2017  . Colon Cancer Screening  10/27/2017  . Tetanus Vaccine  06/20/2024  . DEXA scan (bone density measurement)  Completed  .  Hepatitis C: One time screening is recommended by Center for Disease Control  (CDC) for  adults born from [birth year] through 1966.   Completed  . Pneumonia vaccines  Completed   Preventive Care for Adults  A healthy lifestyle and preventive care can promote health and wellness. Preventive health guidelines for adults include the following key practices.  . A routine yearly physical is a good way to check with your health care provider about your health and preventive screening. It is a chance to share any concerns and updates on your health and to receive a thorough exam.  . Visit your dentist for a routine exam and preventive care every 6 months. Brush your teeth twice a day and floss once a day. Good oral hygiene prevents tooth decay and gum disease.  . The frequency of eye exams is based on your age, health, family medical history, use  of contact lenses, and other factors. Follow your health care provider's ecommendations for frequency of eye exams.  . Eat a healthy diet. Foods like vegetables, fruits, whole grains, low-fat dairy products, and lean protein foods contain the nutrients you need without too many calories. Decrease your intake of foods high in solid fats, added sugars, and salt. Eat the right amount of  calories for you. Get information about a proper diet from your health care provider, if necessary.  . Regular physical exercise is one of the most important things you can do for your health. Most adults should get at least 150 minutes of moderate-intensity exercise (any activity that increases your heart rate and causes you to sweat) each week. In addition, most adults need muscle-strengthening exercises on 2 or more days a week.  Silver Sneakers may be a benefit available to you. To determine eligibility, you may visit the website: www.silversneakers.com or contact program at 249-119-6513 Mon-Fri between 8AM-8PM.   . Maintain a healthy weight. The body mass index (BMI) is a screening tool to identify possible weight problems. It provides an estimate of body fat based on height and weight. Your health care provider can find your BMI and can help you achieve or maintain a healthy weight.   For adults 20 years and older: ? A BMI below 18.5 is considered underweight. ? A BMI of 18.5 to 24.9 is normal. ? A BMI of 25 to 29.9 is considered overweight. ? A BMI of 30 and above is considered obese.   . Maintain normal blood lipids and cholesterol levels by exercising and minimizing your intake of saturated fat. Eat a balanced diet with plenty of fruit and vegetables. Blood tests for lipids and cholesterol should begin at age 75 and  be repeated every 5 years. If your lipid or cholesterol levels are high, you are over 50, or you are at high risk for heart disease, you may need your cholesterol levels checked more frequently. Ongoing high lipid and cholesterol levels should be treated with medicines if diet and exercise are not working.  . If you smoke, find out from your health care provider how to quit. If you do not use tobacco, please do not start.  . If you choose to drink alcohol, please do not consume more than 2 drinks per day. One drink is considered to be 12 ounces (355 mL) of beer, 5 ounces  (148 mL) of wine, or 1.5 ounces (44 mL) of liquor.  . If you are 66-54 years old, ask your health care provider if you should take aspirin to prevent strokes.  . Use sunscreen. Apply sunscreen liberally and repeatedly throughout the day. You should seek shade when your shadow is shorter than you. Protect yourself by wearing long sleeves, pants, a wide-brimmed hat, and sunglasses year round, whenever you are outdoors.  . Once a month, do a whole body skin exam, using a mirror to look at the skin on your back. Tell your health care provider of new moles, moles that have irregular borders, moles that are larger than a pencil eraser, or moles that have changed in shape or color.

## 2016-06-19 NOTE — Progress Notes (Signed)
Pre visit review using our clinic review tool, if applicable. No additional management support is needed unless otherwise documented below in the visit note. 

## 2016-06-19 NOTE — Progress Notes (Signed)
PCP notes:   Health maintenance:  No gaps identified.  Abnormal screenings:   None  Patient concerns:   None  Nurse concerns:  None  Next PCP appt:   06/26/16 @ 0830

## 2016-06-20 NOTE — Progress Notes (Signed)
I reviewed health advisor's note, was available for consultation, and agree with documentation and plan.  

## 2016-06-24 ENCOUNTER — Other Ambulatory Visit: Payer: Self-pay | Admitting: Cardiology

## 2016-06-26 ENCOUNTER — Ambulatory Visit (INDEPENDENT_AMBULATORY_CARE_PROVIDER_SITE_OTHER): Payer: Medicare Other | Admitting: Family Medicine

## 2016-06-26 ENCOUNTER — Encounter: Payer: Self-pay | Admitting: Family Medicine

## 2016-06-26 VITALS — BP 136/72 | HR 91 | Temp 97.7°F | Ht 67.25 in | Wt 168.5 lb

## 2016-06-26 DIAGNOSIS — I10 Essential (primary) hypertension: Secondary | ICD-10-CM | POA: Diagnosis not present

## 2016-06-26 DIAGNOSIS — Z0000 Encounter for general adult medical examination without abnormal findings: Secondary | ICD-10-CM | POA: Diagnosis not present

## 2016-06-26 DIAGNOSIS — E7800 Pure hypercholesterolemia, unspecified: Secondary | ICD-10-CM | POA: Diagnosis not present

## 2016-06-26 DIAGNOSIS — E2839 Other primary ovarian failure: Secondary | ICD-10-CM

## 2016-06-26 DIAGNOSIS — I350 Nonrheumatic aortic (valve) stenosis: Secondary | ICD-10-CM

## 2016-06-26 DIAGNOSIS — M8580 Other specified disorders of bone density and structure, unspecified site: Secondary | ICD-10-CM

## 2016-06-26 MED ORDER — ESOMEPRAZOLE MAGNESIUM 40 MG PO CPDR: DELAYED_RELEASE_CAPSULE | ORAL | 3 refills | Status: DC

## 2016-06-26 MED ORDER — ATORVASTATIN CALCIUM 10 MG PO TABS: ORAL_TABLET | ORAL | 3 refills | Status: DC

## 2016-06-26 NOTE — Progress Notes (Signed)
Pre visit review using our clinic review tool, if applicable. No additional management support is needed unless otherwise documented below in the visit note. 

## 2016-06-26 NOTE — Assessment & Plan Note (Signed)
s/p valve replacement Some sob lately  Has echo planned with cardiology

## 2016-06-26 NOTE — Assessment & Plan Note (Signed)
Reviewed health habits including diet and exercise and skin cancer prevention Reviewed appropriate screening tests for age  Also reviewed health mt list, fam hx and immunization status , as well as social and family history   See HPI Labs rev AMW reviewed dexa ordered  utd screening and imms  Enc exercise  f/u with cardiology as planned  Glucose 100 will watch

## 2016-06-26 NOTE — Assessment & Plan Note (Signed)
bp in fair control at this time  BP Readings from Last 1 Encounters:  06/26/16 136/72   No changes needed Disc lifstyle change with low sodium diet and exercise   Labs reviewed Enc exercise  f/u with cardiology / echo re: sob and valve Doing better overall with lower lisinopril dose

## 2016-06-26 NOTE — Patient Instructions (Signed)
Keep cholesterol low Eat a healthy diet Keep exercising Get your echocardiogram as planned  Labs look good  Make sure to drink lots of water- aim for 64 oz of fluids daily for kidney health  Take care of yourself

## 2016-06-26 NOTE — Progress Notes (Signed)
Subjective:    Patient ID: Heidi Middleton, female    DOB: 06-24-1945, 71 y.o.   MRN: [id number]  HPI Here for health maintenance exam and to review chronic medical problems    Has had a hectic week  Husband had a cardiac event last Friday-still doing tests now / in the hospital   Had AMW visit 06/19/16-no concerns   Wt Readings from Last 3 Encounters:  06/26/16 168 lb 8 oz (76.4 kg)  06/19/16 167 lb 4 oz (75.9 kg)  05/29/16 168 lb (76.2 kg)  bmi is 26.1   Mammogram 3/18 neg  Self breast exam - no lumps  No gyn problems or symptoms-has had a hysterectomy   Colonoscopy 8/09- no polyps at that time   dexa 11/13-in the normal range  Wants to do another bone density test  Has family history of OP  Personal past hx of osteopenia  Taking vit D and a MVI  No falls or fractures   utd imms and ID screening  Zoster vaccine 8/08  bp is stable today  No cp or palpitations or headaches or edema  No side effects to medicines  BP Readings from Last 3 Encounters:  06/26/16 136/72  06/19/16 122/78  05/29/16 124/64     Hx of hyperlipidemia Lab Results  Component Value Date   CHOL 181 06/19/2016   CHOL 177 06/20/2015   CHOL 177 06/14/2014   Lab Results  Component Value Date   HDL 77.50 06/19/2016   HDL 78.30 06/20/2015   HDL 69.80 06/14/2014   Lab Results  Component Value Date   LDLCALC 89 06/19/2016   LDLCALC 84 06/20/2015   LDLCALC 91 06/14/2014   Lab Results  Component Value Date   TRIG 72.0 06/19/2016   TRIG 73.0 06/20/2015   TRIG 83.0 06/14/2014   Lab Results  Component Value Date   CHOLHDL 2 06/19/2016   CHOLHDL 2 06/20/2015   CHOLHDL 3 06/14/2014   No results found for: LDLDIRECT On atorvastatin and diet  Eating a healthy diet  Her cardiologist wanted her LDL under 70 - she will inc her fish oil to 2 daily  Intol of higher statin dose    Pt notes that she started becoming short of breath last month Her cardiologist cut her lisinopril in 1/2 due to  kidney function and also sob  That did help sob a little and has echo on Thursday this week   Results for orders placed or performed in visit on 06/19/16  CBC with Differential/Platelet  Result Value Ref Range   WBC 5.5 4.0 - 10.5 K/uL   RBC 4.29 3.87 - 5.11 Mil/uL   Hemoglobin 12.7 12.0 - 15.0 g/dL   HCT 38.5 36.0 - 46.0 %   MCV 89.7 78.0 - 100.0 fl   MCHC 33.1 30.0 - 36.0 g/dL   RDW 13.8 11.5 - 15.5 %   Platelets 245.0 150.0 - 400.0 K/uL   Neutrophils Relative % 59.4 43.0 - 77.0 %   Lymphocytes Relative 27.3 12.0 - 46.0 %   Monocytes Relative 9.4 3.0 - 12.0 %   Eosinophils Relative 2.2 0.0 - 5.0 %   Basophils Relative 1.7 0.0 - 3.0 %   Neutro Abs 3.2 1.4 - 7.7 K/uL   Lymphs Abs 1.5 0.7 - 4.0 K/uL   Monocytes Absolute 0.5 0.1 - 1.0 K/uL   Eosinophils Absolute 0.1 0.0 - 0.7 K/uL   Basophils Absolute 0.1 0.0 - 0.1 K/uL  Comprehensive metabolic panel  Result  Value Ref Range   Sodium 140 135 - 145 mEq/L   Potassium 4.7 3.5 - 5.1 mEq/L   Chloride 105 96 - 112 mEq/L   CO2 30 19 - 32 mEq/L   Glucose, Bld 100 (H) 70 - 99 mg/dL   BUN 17 6 - 23 mg/dL   Creatinine, Ser 0.99 0.40 - 1.20 mg/dL   Total Bilirubin 0.6 0.2 - 1.2 mg/dL   Alkaline Phosphatase 76 39 - 117 U/L   AST 25 0 - 37 U/L   ALT 16 0 - 35 U/L   Total Protein 7.2 6.0 - 8.3 g/dL   Albumin 4.2 3.5 - 5.2 g/dL   Calcium 9.9 8.4 - 10.5 mg/dL   GFR 58.64 (L) >60.00 mL/min  Lipid panel  Result Value Ref Range   Cholesterol 181 0 - 200 mg/dL   Triglycerides 72.0 0.0 - 149.0 mg/dL   HDL 77.50 >39.00 mg/dL   VLDL 14.4 0.0 - 40.0 mg/dL   LDL Cholesterol 89 0 - 99 mg/dL   Total CHOL/HDL Ratio 2    NonHDL 103.02   TSH  Result Value Ref Range   TSH 1.50 0.35 - 4.50 uIU/mL    Patient Active Problem List   Diagnosis Date Noted  . Estrogen deficiency 06/26/2016  . Aortic valve stenosis   . Aortic stenosis 07/07/2015  . Routine general medical examination at a health care facility 06/24/2015  . Heart murmur, systolic  40/98/1191  . Need for hepatitis C screening test 06/20/2015  . Medicare annual wellness visit, subsequent 06/20/2015  . Encounter for Medicare annual wellness exam 06/13/2012  . Osteopenia 01/31/2010  . GERD 12/21/2009  . HYPERCHOLESTEROLEMIA 10/09/2006  . Quit smoking 10/09/2006  . GLAUCOMA 10/09/2006  . Essential hypertension 10/09/2006  . 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 OSTEOARTHRITIS 10/09/2006  . COLONIC POLYPS, HX OF 10/09/2006   Past Medical History:  Diagnosis Date  . Aortic stenosis   . Colonic polyp   . GERD (gastroesophageal reflux disease)   . Headache    migraines prior to hysterectomy  . Hypertension   . PONV (postoperative nausea and vomiting)    nausea  . Shingles    Past Surgical History:  Procedure Laterality Date  . ABDOMINAL HYSTERECTOMY    . AORTIC VALVE REPLACEMENT N/A 08/16/2015   Procedure: AORTIC VALVE REPLACEMENT (AVR);  Surgeon: Ivin Poot, MD;  Location: [facility];  Service: Open Heart Surgery;  Laterality: N/A;  . APPENDECTOMY     taken out with hysterectomy  . BREAST BIOPSY Right 1990   EXCISIONAL - NEG  . CARDIAC CATHETERIZATION Bilateral 08/04/2015   Procedure: Right/Left Heart Cath and Coronary Angiography;  Surgeon: Wellington Hampshire, MD;  Location: [facility];  Service: Cardiovascular;  Laterality: Bilateral;  . EYE SURGERY N/A    detatched retina  . KNEE SURGERY  10/2005   calcinosis - in office procedure  . TEE WITHOUT CARDIOVERSION N/A 08/16/2015   Procedure: TRANSESOPHAGEAL ECHOCARDIOGRAM (TEE);  Surgeon: Ivin Poot, MD;  Location: [facility];  Service: Open Heart Surgery;  Laterality: N/A;   Social History  Substance Use Topics  . Smoking status: Former Smoker    Quit date: 07/07/2002  . Smokeless tobacco: Never Used  . Alcohol use No   Family History  Problem Relation Age of Onset  . Cancer Mother     lung  . Stroke Mother   . Cancer Father     pancreatic  . Heart disease Sister   .  Hypertension Brother     . Cancer Brother   . Diabetes Maternal Grandmother   . Hypertension Brother   . Obesity Sister   . Lung cancer Brother   . Breast cancer Neg Hx    Allergies  Allergen Reactions  . Omeprazole     REACTION: not effective  . Ranitidine Hcl     REACTION: not effective  . Codeine Nausea And Vomiting    REACTION: vomiting   Current Outpatient Prescriptions on File Prior to Visit  Medication Sig Dispense Refill  . acetaminophen (TYLENOL) 500 MG tablet Take 2 tablets (1,000 mg total) by mouth every 6 (six) hours as needed for mild pain or fever. 30 tablet 0  . aspirin EC 325 MG EC tablet Take 1 tablet (325 mg total) by mouth daily. 30 tablet 0  . cholecalciferol (VITAMIN D) 1000 UNITS tablet Take 1,000 Units by mouth 2 (two) times daily.     Marland Kitchen lisinopril (PRINIVIL,ZESTRIL) 5 MG tablet Take 1 tablet (5 mg total) by mouth daily. 30 tablet 3  . metoprolol tartrate (LOPRESSOR) 25 MG tablet TAKE 0.5 TABLETS (12.5 MG TOTAL) BY MOUTH 2 (TWO) TIMES DAILY. 30 tablet 6  . Multiple Vitamin (MULTIVITAMIN) tablet Take 1 tablet by mouth daily.    . Omega-3 Fatty Acids (FISH OIL) 1000 MG CPDR Take 1,000 mg by mouth 2 (two) times daily.     No current facility-administered medications on file prior to visit.     Review of Systems Review of Systems  Constitutional: Negative for fever, appetite change, fatigue and unexpected weight change.  Eyes: Negative for pain and visual disturbance.  Respiratory: Negative for cough and shortness of breath.   Cardiovascular: Negative for cp or palpitations    Gastrointestinal: Negative for nausea, diarrhea and constipation.  Genitourinary: Negative for urgency and frequency.  Skin: Negative for pallor or rash   Neurological: Negative for weakness, light-headedness, numbness and headaches.  Hematological: Negative for adenopathy. Does not bruise/bleed easily.  Psychiatric/Behavioral: Negative for dysphoric mood. The patient is not nervous/anxious. Pos for stressors          Objective:   Physical Exam  Constitutional: She appears well-developed and well-nourished. No distress.  Well appearing   HENT:  Head: Normocephalic and atraumatic.  Right Ear: External ear normal.  Left Ear: External ear normal.  Mouth/Throat: Oropharynx is clear and moist.  Eyes: Conjunctivae and EOM are normal. Pupils are equal, round, and reactive to light. No scleral icterus.  Neck: Normal range of motion. Neck supple. No JVD present. Carotid bruit is not present. No thyromegaly present.  Cardiovascular: Normal rate, regular rhythm, normal heart sounds and intact distal pulses.  Exam reveals no gallop.   Pulmonary/Chest: Effort normal and breath sounds normal. No respiratory distress. She has no wheezes. She exhibits no tenderness.  Abdominal: Soft. Bowel sounds are normal. She exhibits no distension, no abdominal bruit and no mass. There is no tenderness.  Genitourinary: No breast swelling, tenderness, discharge or bleeding.  Genitourinary Comments: Breast exam: No mass, nodules, thickening, tenderness, bulging, retraction, inflamation, nipple discharge or skin changes noted.  No axillary or clavicular LA.      Musculoskeletal: Normal range of motion. She exhibits no edema or tenderness.  Lymphadenopathy:    She has no cervical adenopathy.  Neurological: She is alert. She has normal reflexes. She displays no tremor. No cranial nerve deficit. She exhibits normal muscle tone. Coordination normal.  Skin: Skin is warm and dry. No rash noted. No erythema. No pallor.  Few lentigines and sks  Psychiatric: She has a normal mood and affect.  Seems stressed today but pleasant affect          Assessment & Plan:   Problem List Items Addressed This Visit      Cardiovascular and Mediastinum   Aortic valve stenosis    s/p valve replacement Some sob lately  Has echo planned with cardiology       Relevant Medications   atorvastatin (LIPITOR) 10 MG tablet   Essential  hypertension    bp in fair control at this time  BP Readings from Last 1 Encounters:  06/26/16 136/72   No changes needed Disc lifstyle change with low sodium diet and exercise   Labs reviewed Enc exercise  f/u with cardiology / echo re: sob and valve Doing better overall with lower lisinopril dose      Relevant Medications   atorvastatin (LIPITOR) 10 MG tablet     Musculoskeletal and Integument   Osteopenia    Past hx of openia but last dexa in nl range 2013 Ordered dexa today No falls or fx Takes vit D Has a family hx        Other   Estrogen deficiency   Relevant Orders   DG Bone Density   HYPERCHOLESTEROLEMIA    Lipids are fairly well controlled but her cardiologist wants LDL under 70 if possible She cannot tolerate higher statin dose She did inc her fish oil dose  Continue to follow  Hx of aortic stenosis with valve repl in the past       Relevant Medications   atorvastatin (LIPITOR) 10 MG tablet   Routine general medical examination at a health care facility - Primary    Reviewed health habits including diet and exercise and skin cancer prevention Reviewed appropriate screening tests for age  Also reviewed health mt list, fam hx and immunization status , as well as social and family history   See HPI Labs rev AMW reviewed dexa ordered  utd screening and imms  Enc exercise  f/u with cardiology as planned  Glucose 100 will watch

## 2016-06-26 NOTE — Assessment & Plan Note (Signed)
Past hx of openia but last dexa in nl range 2013 Ordered dexa today No falls or fx Takes vit D Has a family hx

## 2016-06-26 NOTE — Assessment & Plan Note (Signed)
Lipids are fairly well controlled but her cardiologist wants LDL under 70 if possible She cannot tolerate higher statin dose She did inc her fish oil dose  Continue to follow  Hx of aortic stenosis with valve repl in the past

## 2016-06-28 ENCOUNTER — Ambulatory Visit (INDEPENDENT_AMBULATORY_CARE_PROVIDER_SITE_OTHER): Payer: Medicare Other

## 2016-06-28 ENCOUNTER — Other Ambulatory Visit: Payer: Self-pay

## 2016-06-28 DIAGNOSIS — I350 Nonrheumatic aortic (valve) stenosis: Secondary | ICD-10-CM | POA: Diagnosis not present

## 2016-07-22 ENCOUNTER — Other Ambulatory Visit: Payer: Self-pay | Admitting: Cardiology

## 2016-08-22 ENCOUNTER — Other Ambulatory Visit: Payer: Self-pay

## 2016-08-22 MED ORDER — METOPROLOL TARTRATE 25 MG PO TABS: 12.5000 mg | ORAL_TABLET | Freq: Two times a day (BID) | ORAL | 6 refills | Status: DC

## 2016-08-22 MED ORDER — LISINOPRIL 5 MG PO TABS: 5.0000 mg | ORAL_TABLET | Freq: Every day | ORAL | 3 refills | Status: DC

## 2016-08-22 NOTE — Telephone Encounter (Signed)
Refill sent for metoprolol tart  

## 2016-08-23 ENCOUNTER — Other Ambulatory Visit: Payer: Self-pay | Admitting: Family Medicine

## 2016-09-15 IMAGING — CR DG CHEST 2V
2 series · 2 of 2 positions shown · non-contrast
Comparison: August 23, 2015

CLINICAL DATA: Status post aortic valvular replacement without
cardioversion in Saturday July, 2015.

EXAM:
CHEST  2 VIEW

[w chest pa]
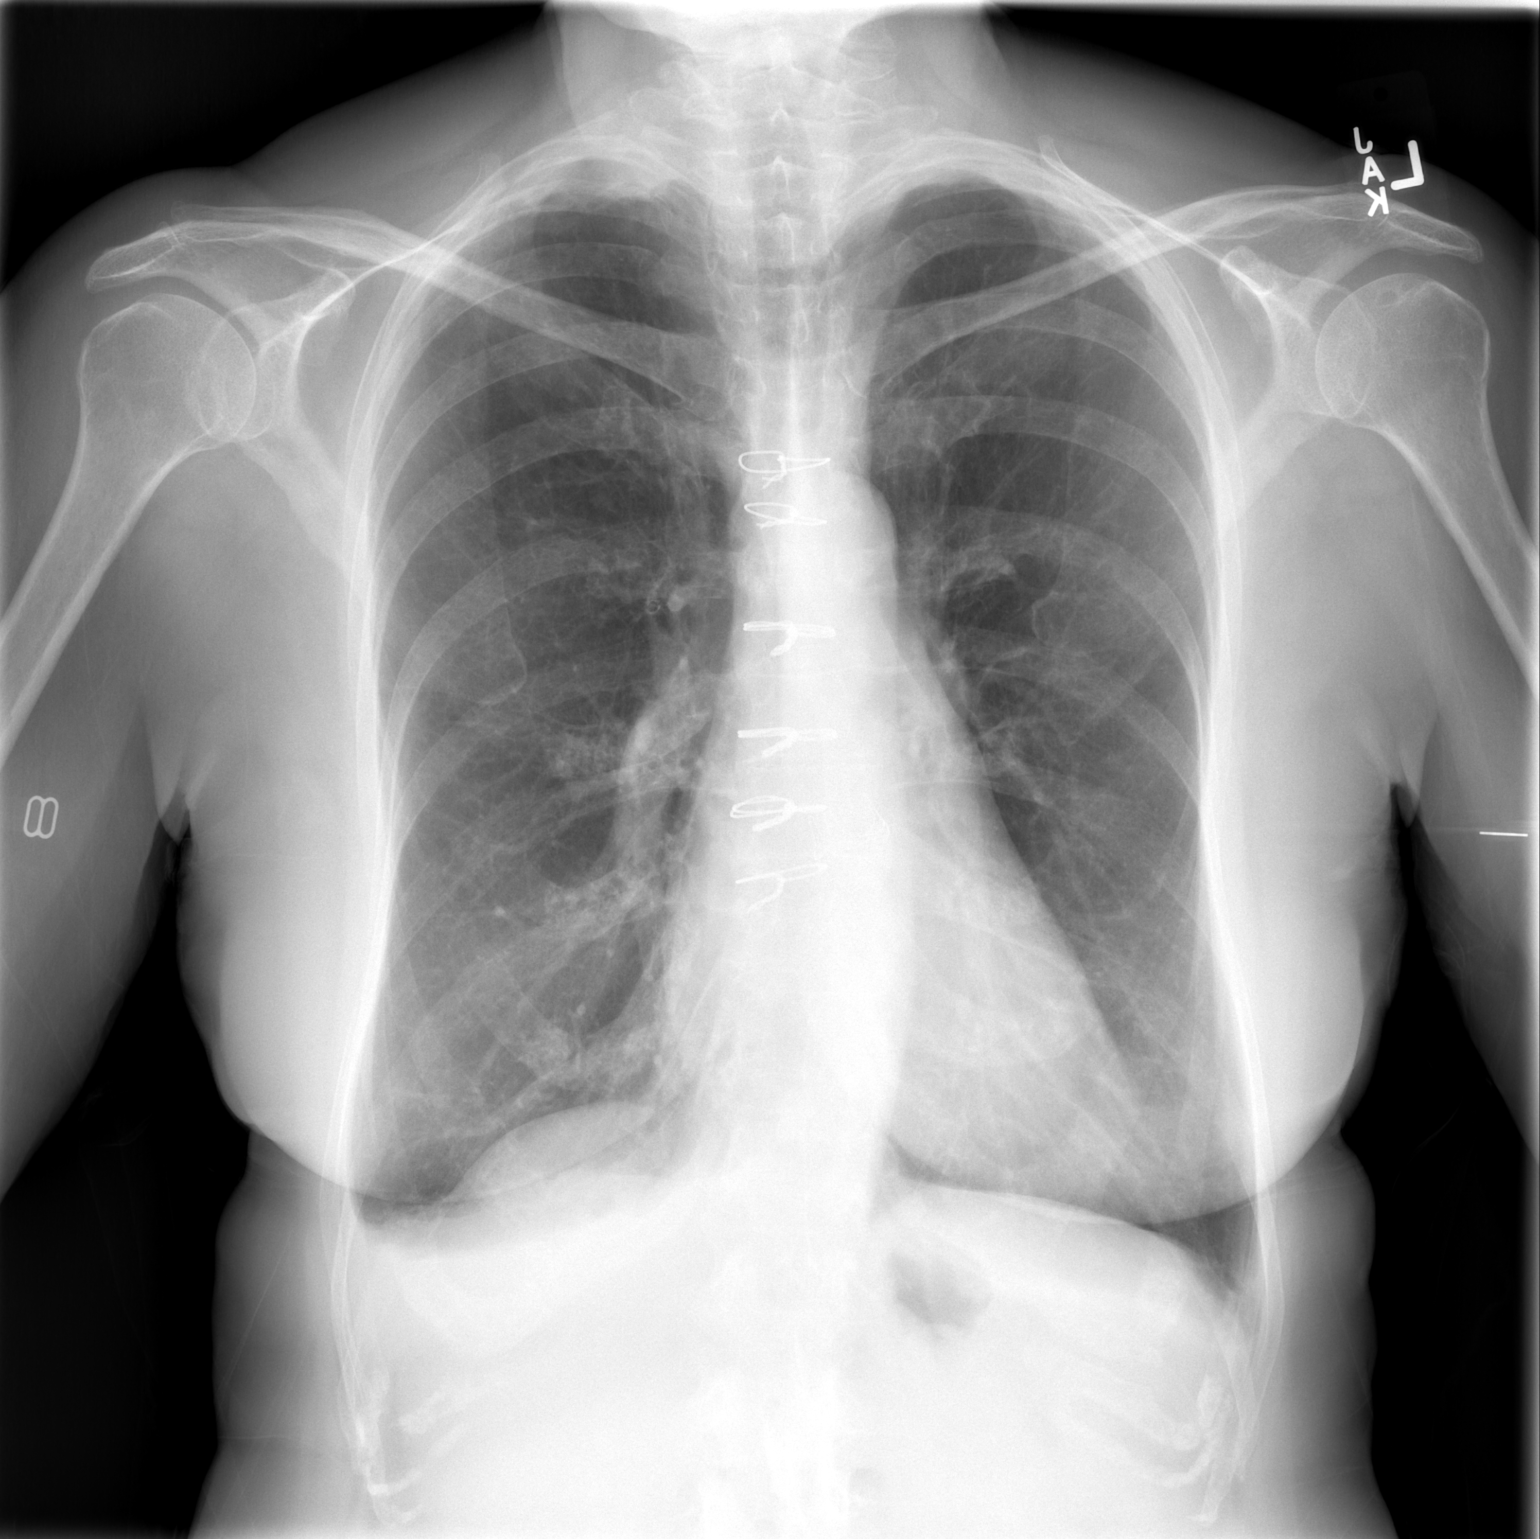

[w chest lat]
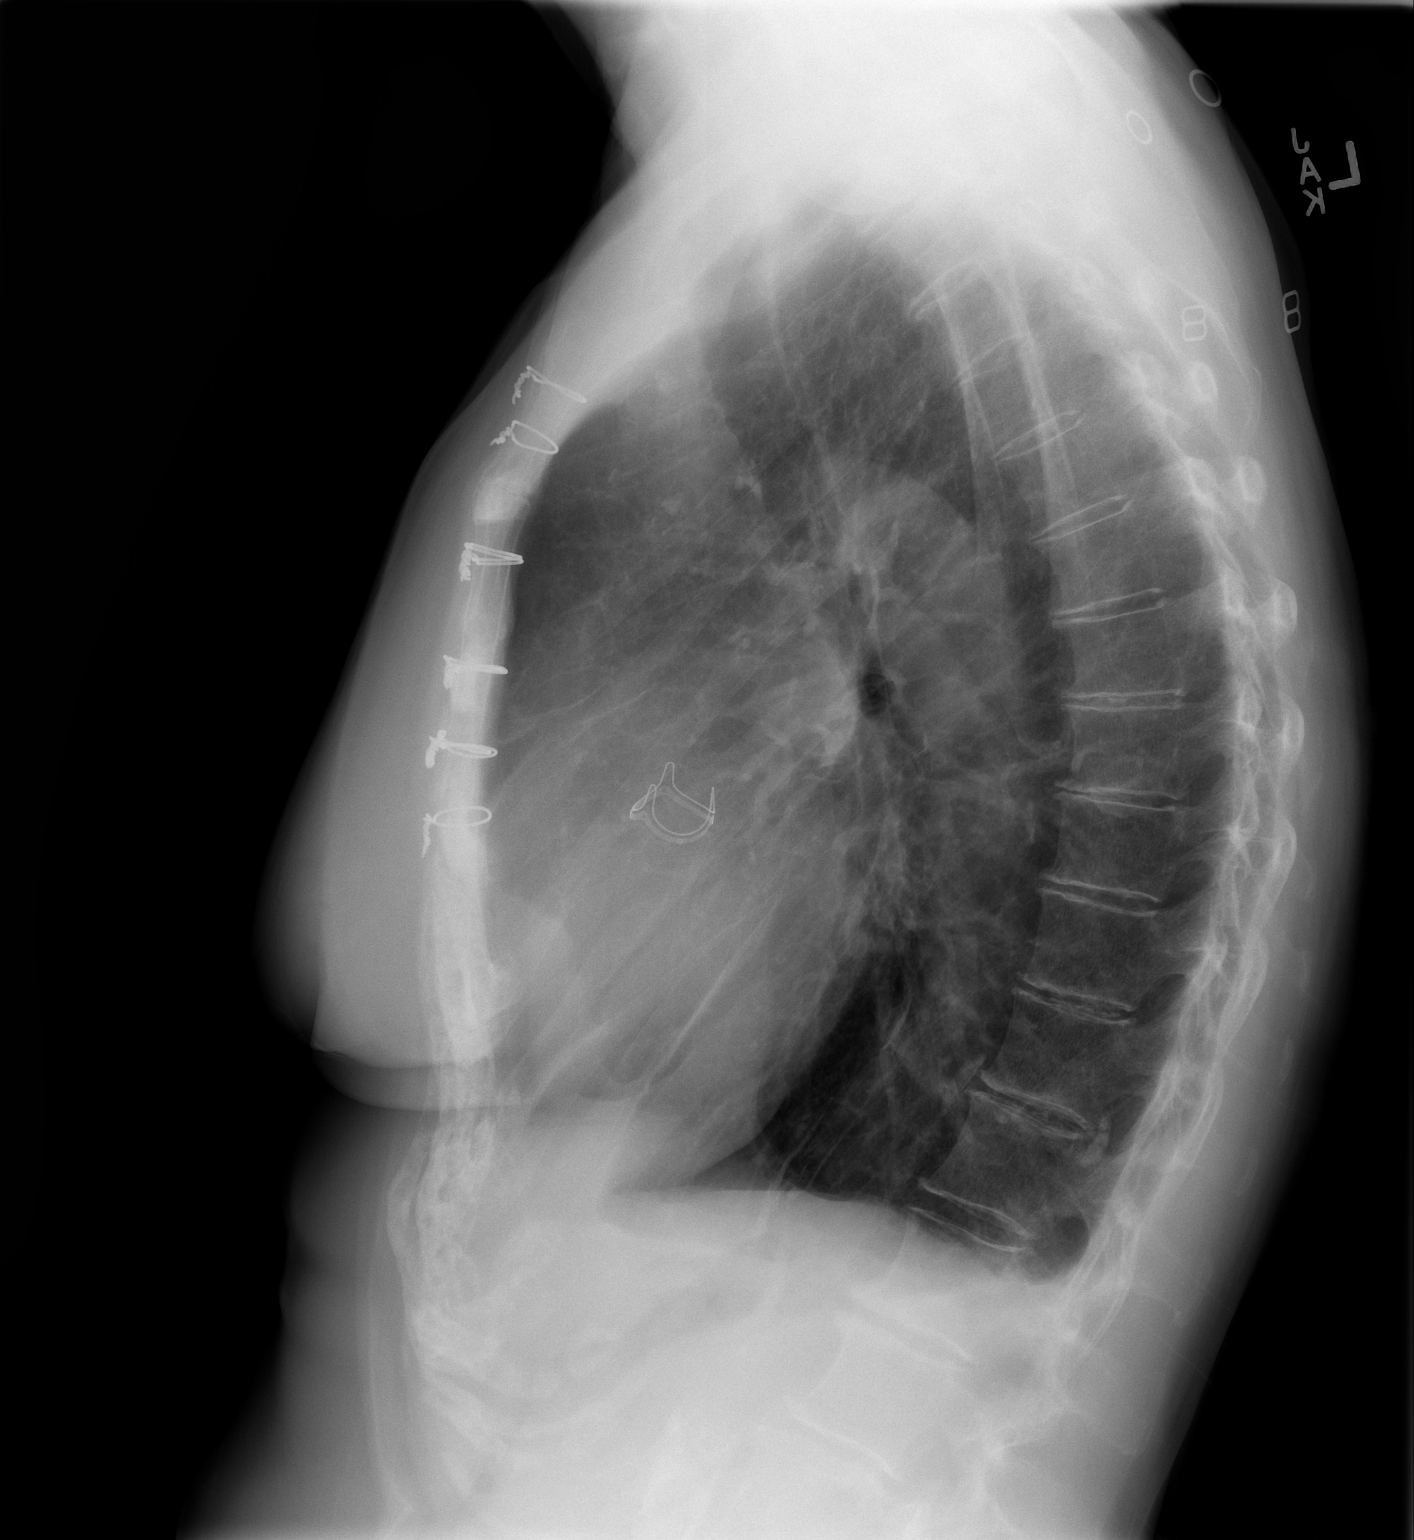

[2 of 2 positions shown; findings below may reference images not displayed]

FINDINGS: The heart size and mediastinal contours are stable. Aortic valvular
replacement ring is identified. Minimal right pleural effusion is
noted. There is no focal pneumonia or pulmonary edema. The
visualized skeletal structures are unremarkable.
IMPRESSION: Minimal right pleural effusion. No focal pneumonia or pulmonary
edema.

## 2016-09-23 ENCOUNTER — Encounter (HOSPITAL_COMMUNITY): Payer: Self-pay | Admitting: Emergency Medicine

## 2016-09-23 ENCOUNTER — Emergency Department (HOSPITAL_COMMUNITY)
Admission: EM | Admit: 2016-09-23 | Discharge: 2016-09-23 | Disposition: A | Discharge status: Home / Self Care | Payer: Medicare Other | Attending: Emergency Medicine | Admitting: Emergency Medicine

## 2016-09-23 ENCOUNTER — Emergency Department (HOSPITAL_COMMUNITY): Payer: Medicare Other

## 2016-09-23 DIAGNOSIS — I10 Essential (primary) hypertension: Secondary | ICD-10-CM | POA: Insufficient documentation

## 2016-09-23 DIAGNOSIS — K529 Noninfective gastroenteritis and colitis, unspecified: Secondary | ICD-10-CM | POA: Diagnosis not present

## 2016-09-23 DIAGNOSIS — Z79899 Other long term (current) drug therapy: Secondary | ICD-10-CM | POA: Diagnosis not present

## 2016-09-23 DIAGNOSIS — Z87891 Personal history of nicotine dependence: Secondary | ICD-10-CM | POA: Diagnosis not present

## 2016-09-23 DIAGNOSIS — R112 Nausea with vomiting, unspecified: Secondary | ICD-10-CM | POA: Insufficient documentation

## 2016-09-23 DIAGNOSIS — Z7982 Long term (current) use of aspirin: Secondary | ICD-10-CM | POA: Insufficient documentation

## 2016-09-23 DIAGNOSIS — R197 Diarrhea, unspecified: Secondary | ICD-10-CM | POA: Diagnosis present

## 2016-09-23 LAB — HEPATIC FUNCTION PANEL
ALT: 35 U/L (ref 14–54)
AST: 47 U/L — ABNORMAL HIGH (ref 15–41)
Albumin: 3.3 g/dL — ABNORMAL LOW (ref 3.5–5.0)
Alkaline Phosphatase: 75 U/L (ref 38–126)
Bilirubin, Direct: <0.1 mg/dL — ABNORMAL LOW (ref 0.1–0.5)
Total Bilirubin: 0.5 mg/dL (ref 0.3–1.2)
Total Protein: 6.6 g/dL (ref 6.5–8.1)

## 2016-09-23 LAB — URINALYSIS, ROUTINE W REFLEX MICROSCOPIC
Bilirubin Urine: NEGATIVE
Glucose, UA: NEGATIVE mg/dL
Hgb urine dipstick: NEGATIVE
Ketones, ur: NEGATIVE mg/dL
Leukocytes, UA: NEGATIVE
Nitrite: NEGATIVE
Protein, ur: NEGATIVE mg/dL
Specific Gravity, Urine: >1.046 — ABNORMAL HIGH (ref 1.005–1.030)
pH: 5 (ref 5.0–8.0)

## 2016-09-23 LAB — CBC WITH DIFFERENTIAL/PLATELET
Basophils Absolute: 0 10*3/uL (ref 0.0–0.1)
Basophils Relative: 0 %
Eosinophils Absolute: 0 10*3/uL (ref 0.0–0.7)
Eosinophils Relative: 0 %
HCT: 36.1 % (ref 36.0–46.0)
Hemoglobin: 12.3 g/dL (ref 12.0–15.0)
Lymphocytes Relative: 13 %
Lymphs Abs: 0.9 10*3/uL (ref 0.7–4.0)
MCH: 29.9 pg (ref 26.0–34.0)
MCHC: 34.1 g/dL (ref 30.0–36.0)
MCV: 87.6 fL (ref 78.0–100.0)
Monocytes Absolute: 0.5 10*3/uL (ref 0.1–1.0)
Monocytes Relative: 7 %
Neutro Abs: 5.6 10*3/uL (ref 1.7–7.7)
Neutrophils Relative %: 80 %
Platelets: 185 10*3/uL (ref 150–400)
RBC: 4.12 MIL/uL (ref 3.87–5.11)
RDW: 13.3 % (ref 11.5–15.5)
WBC: 7.1 10*3/uL (ref 4.0–10.5)

## 2016-09-23 LAB — BASIC METABOLIC PANEL
Anion gap: 9 (ref 5–15)
BUN: 22 mg/dL — ABNORMAL HIGH (ref 6–20)
CO2: 21 mmol/L — ABNORMAL LOW (ref 22–32)
Calcium: 9.2 mg/dL (ref 8.9–10.3)
Chloride: 104 mmol/L (ref 101–111)
Creatinine, Ser: 1.17 mg/dL — ABNORMAL HIGH (ref 0.44–1.00)
GFR calc Af Amer: 53 mL/min — ABNORMAL LOW (ref >60)
GFR calc non Af Amer: 45 mL/min — ABNORMAL LOW (ref >60)
Glucose, Bld: 111 mg/dL — ABNORMAL HIGH (ref 65–99)
Potassium: 3.7 mmol/L (ref 3.5–5.1)
Sodium: 134 mmol/L — ABNORMAL LOW (ref 135–145)

## 2016-09-23 LAB — LIPASE, BLOOD: Lipase: 30 U/L (ref 11–51)

## 2016-09-23 LAB — CBG MONITORING, ED: Glucose-Capillary: 106 mg/dL — ABNORMAL HIGH (ref 65–99)

## 2016-09-23 MED ORDER — IOPAMIDOL (ISOVUE-300) INJECTION 61%
80.0000 mL | Freq: Once | INTRAVENOUS | Status: AC | PRN
  Administered 2016-09-23: 80 mL via INTRAVENOUS

## 2016-09-23 MED ORDER — IOPAMIDOL (ISOVUE-300) INJECTION 61%
INTRAVENOUS | Status: AC
  Filled 2016-09-23: qty 100

## 2016-09-23 MED ORDER — CIPROFLOXACIN HCL 500 MG PO TABS: 500.0000 mg | ORAL_TABLET | Freq: Two times a day (BID) | ORAL | 0 refills | Status: DC

## 2016-09-23 MED ORDER — ONDANSETRON HCL 4 MG/2ML IJ SOLN
4.0000 mg | Freq: Once | INTRAMUSCULAR | Status: AC
  Administered 2016-09-23: 4 mg via INTRAVENOUS
  Filled 2016-09-23: qty 2

## 2016-09-23 MED ORDER — SODIUM CHLORIDE 0.9 % IV BOLUS (SEPSIS)
500.0000 mL | Freq: Once | INTRAVENOUS | Status: AC
  Administered 2016-09-23: 500 mL via INTRAVENOUS

## 2016-09-23 MED ORDER — METRONIDAZOLE 500 MG PO TABS
500.0000 mg | ORAL_TABLET | Freq: Once | ORAL | Status: AC
  Administered 2016-09-23: 500 mg via ORAL
  Filled 2016-09-23: qty 1

## 2016-09-23 MED ORDER — METRONIDAZOLE 500 MG PO TABS: 500.0000 mg | ORAL_TABLET | Freq: Two times a day (BID) | ORAL | 0 refills | Status: DC

## 2016-09-23 MED ORDER — CIPROFLOXACIN HCL 500 MG PO TABS
500.0000 mg | ORAL_TABLET | Freq: Once | ORAL | Status: AC
  Administered 2016-09-23: 500 mg via ORAL
  Filled 2016-09-23: qty 1

## 2016-09-23 NOTE — ED Notes (Signed)
Unable to get urine sample because pt just went to the bathroom.  

## 2016-09-23 NOTE — ED Provider Notes (Signed)
[facility] DEPT Provider Note   CSN: [id number] Arrival date & time: 09/23/16  0245     History   Chief Complaint Chief Complaint  Patient presents with  . Loss of Consciousness  . Diarrhea    HPI Heidi Middleton is a 71 y.o. female.  Patient with a history of valvular disease (Aortic stenosis), HTN, GERD, HLD presents with nausea, vomiting and diarrhea for the past 4 days. Today while using the bathroom she had a syncopal episode. No injury. She denies fever, hematemesis, melena or bloody stools during her illness. She has been unable to eat or drink anything during the last 4 days while she has been sick. She denies pain.    The history is provided by the patient. No language interpreter was used.  Loss of Consciousness   Associated symptoms include nausea, vomiting and weakness. Pertinent negatives include abdominal pain, chest pain and fever.  Diarrhea   Associated symptoms include vomiting. Pertinent negatives include no abdominal pain, no chills and no cough.    Past Medical History:  Diagnosis Date  . Aortic stenosis   . Colonic polyp   . GERD (gastroesophageal reflux disease)   . Headache    migraines prior to hysterectomy  . Hypertension   . PONV (postoperative nausea and vomiting)    nausea  . Shingles     Patient Active Problem List   Diagnosis Date Noted  . Estrogen deficiency 06/26/2016  . Aortic valve stenosis   . Aortic stenosis 07/07/2015  . Routine general medical examination at a health care facility 06/24/2015  . Heart murmur, systolic 94/70/9628  . Need for hepatitis C screening test 06/20/2015  . Medicare annual wellness visit, subsequent 06/20/2015  . Encounter for Medicare annual wellness exam 06/13/2012  . Osteopenia 01/31/2010  . GERD 12/21/2009  . HYPERCHOLESTEROLEMIA 10/09/2006  . Quit smoking 10/09/2006  . GLAUCOMA 10/09/2006  . Essential hypertension 10/09/2006  . 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 OSTEOARTHRITIS 10/09/2006    . COLONIC POLYPS, HX OF 10/09/2006    Past Surgical History:  Procedure Laterality Date  . ABDOMINAL HYSTERECTOMY    . AORTIC VALVE REPLACEMENT N/A 08/16/2015   Procedure: AORTIC VALVE REPLACEMENT (AVR);  Surgeon: Ivin Poot, MD;  Location: [facility];  Service: Open Heart Surgery;  Laterality: N/A;  . APPENDECTOMY     taken out with hysterectomy  . BREAST BIOPSY Right 1990   EXCISIONAL - NEG  . CARDIAC CATHETERIZATION Bilateral 08/04/2015   Procedure: Right/Left Heart Cath and Coronary Angiography;  Surgeon: Wellington Hampshire, MD;  Location: [facility];  Service: Cardiovascular;  Laterality: Bilateral;  . EYE SURGERY N/A    detatched retina  . KNEE SURGERY  10/2005   calcinosis - in office procedure  . TEE WITHOUT CARDIOVERSION N/A 08/16/2015   Procedure: TRANSESOPHAGEAL ECHOCARDIOGRAM (TEE);  Surgeon: Ivin Poot, MD;  Location: [facility];  Service: Open Heart Surgery;  Laterality: N/A;    OB History    No data available       Home Medications    Prior to Admission medications   Medication Sig Start Date End Date Taking? Authorizing Provider  acetaminophen (TYLENOL) 500 MG tablet Take 2 tablets (1,000 mg total) by mouth every 6 (six) hours as needed for mild pain or fever. 08/21/15   Barrett, Lodema Hong, PA-C  aspirin EC 325 MG EC tablet Take 1 tablet (325 mg total) by mouth daily. 08/21/15   Barrett, Erin R, PA-C  atorvastatin (LIPITOR) 10  MG tablet TAKE 0.5 TABLETS (5 MG TOTAL) BY MOUTH DAILY. 06/26/16   Tower, Wynelle Fanny, MD  cholecalciferol (VITAMIN D) 1000 UNITS tablet Take 1,000 Units by mouth 2 (two) times daily.     [provider]  esomeprazole (NEXIUM) 40 MG capsule TAKE 1 CAPSULE (40 MG TOTAL) BY MOUTH DAILY BEFORE BREAKFAST. 06/26/16   Tower, Wynelle Fanny, MD  lisinopril (PRINIVIL,ZESTRIL) 5 MG tablet Take 1 tablet (5 mg total) by mouth daily. 08/22/16 11/20/16  End, Harrell Gave, MD  metoprolol tartrate (LOPRESSOR) 25 MG tablet Take 0.5 tablets (12.5 mg total) by mouth  2 (two) times daily. 08/22/16   End, Harrell Gave, MD  Multiple Vitamin (MULTIVITAMIN) tablet Take 1 tablet by mouth daily.    [provider]  Omega-3 Fatty Acids (FISH OIL) 1000 MG CPDR Take 1,000 mg by mouth 2 (two) times daily.    [provider]    Family History Family History  Problem Relation Age of Onset  . Cancer Mother        lung  . Stroke Mother   . Cancer Father        pancreatic  . Heart disease Sister   . Hypertension Brother   . Cancer Brother   . Diabetes Maternal Grandmother   . Hypertension Brother   . Obesity Sister   . Lung cancer Brother   . Breast cancer Neg Hx     Social History Social History  Substance Use Topics  . Smoking status: Former Smoker    Quit date: 07/07/2002  . Smokeless tobacco: Never Used  . Alcohol use No     Allergies   Omeprazole; Ranitidine hcl; and Codeine   Review of Systems Review of Systems  Constitutional: Positive for activity change and appetite change. Negative for chills and fever.  HENT: Negative.   Respiratory: Negative.  Negative for cough and shortness of breath.   Cardiovascular: Positive for syncope. Negative for chest pain.  Gastrointestinal: Positive for diarrhea, nausea and vomiting. Negative for abdominal pain.  Genitourinary: Positive for decreased urine volume.  Musculoskeletal: Negative.   Skin: Negative.  Negative for rash.  Neurological: Positive for syncope and weakness.     Physical Exam Updated Vital Signs BP (!) 144/65 (BP Location: Right Arm)   Pulse 73   Temp 97.6 F (36.4 C) (Oral)   Resp 18   SpO2 98%   Physical Exam  Constitutional: She is oriented to person, place, and time. She appears well-developed and well-nourished. No distress.  HENT:  Head: Normocephalic.  Mouth/Throat: Mucous membranes are dry.  Neck: Normal range of motion. Neck supple.  Cardiovascular: Normal rate and regular rhythm.   Pulmonary/Chest: Effort normal and breath sounds normal. She has  no wheezes. She has no rales.  Abdominal: Soft. Bowel sounds are normal. She exhibits no distension. There is tenderness (Mild, diffuse tenderness.). There is no rebound and no guarding.  Musculoskeletal: Normal range of motion.  Neurological: She is alert and oriented to person, place, and time.  Skin: Skin is warm and dry. No rash noted.  Psychiatric: She has a normal mood and affect.     ED Treatments / Results  Labs (all labs ordered are listed, but only abnormal results are displayed) Labs Reviewed  CBG MONITORING, ED - Abnormal; Notable for the following:       Result Value   Glucose-Capillary 106 (*)    All other components within normal limits  BASIC METABOLIC PANEL  URINALYSIS, ROUTINE W REFLEX MICROSCOPIC  CBC WITH  DIFFERENTIAL/PLATELET    EKG  EKG Interpretation None       Radiology No results found.  Procedures Procedures (including critical care time)  Medications Ordered in ED Medications  ondansetron (ZOFRAN) injection 4 mg (not administered)     Initial Impression / Assessment and Plan / ED Course  I have reviewed the triage vital signs and the nursing notes.  Pertinent labs & imaging results that were available during my care of the patient were reviewed by me and considered in my medical decision making (see chart for details).     Patient presents with N, V, D x 4 days, today with syncopal episode while using the bathroom. No injury during syncope. She is started on fluids, given Zofran for nausea. VSS, no tachycardia, hypoxia, afebrile.   CT scan ordered to evaluate abdomen and is pending at the time of sign out to Alyse Low, PA-C. Plan: PO challenge, review CT results and disposition appropriately.     Final Clinical Impressions(s) / ED Diagnoses   Final diagnoses:  None   1. Nausea, vomiting, diarrhea  New Prescriptions New Prescriptions   No medications on file     Charlann Lange, Hershal Coria 10/01/16 1275    Duffy Bruce,  MD 10/01/16 0700

## 2016-09-23 NOTE — Discharge Instructions (Signed)
See your Physician for recheck in 2-3 days.   Take antibiotic as directed

## 2016-09-23 NOTE — ED Provider Notes (Signed)
Pt feels better after Iv fluids,  Orthostatics normal.   Pt counseled on results of ct scan.    Meds ordered this encounter  Medications  . ondansetron (ZOFRAN) injection 4 mg  . aspirin EC 325 MG tablet    Sig: Take 325 mg by mouth daily.  Marland Kitchen omega-3 acid ethyl esters (LOVAZA) 1 g capsule    Sig: Take 1 g by mouth 2 (two) times daily.  . Multiple Vitamin (MULTIVITAMIN WITH MINERALS) TABS tablet    Sig: Take 1 tablet by mouth daily.  . fluticasone (FLONASE) 50 MCG/ACT nasal spray    Sig: Place 1-2 sprays into both nostrils daily as needed for rhinitis.  . Bismuth Subsalicylate (PEPTO-BISMOL) 262 MG TABS    Sig: Take 524 mg by mouth every 6 (six) hours as needed (for GI upset).  Marland Kitchen loperamide (IMODIUM) 2 MG capsule    Sig: Take 2-4 mg by mouth every 6 (six) hours as needed for diarrhea or loose stools.  Marland Kitchen acetaminophen (TYLENOL) 325 MG tablet    Sig: Take 650 mg by mouth every 6 (six) hours as needed for mild pain, moderate pain, fever or headache.  . sodium chloride 0.9 % bolus 500 mL  . iopamidol (ISOVUE-300) 61 % injection 80 mL  . DISCONTD: iopamidol (ISOVUE-300) 61 % injection    May, Robert   : cabinet override  . sodium chloride 0.9 % bolus 500 mL  . ciprofloxacin (CIPRO) tablet 500 mg  . metroNIDAZOLE (FLAGYL) tablet 500 mg  . ciprofloxacin (CIPRO) 500 MG tablet    Sig: Take 1 tablet (500 mg total) by mouth 2 (two) times daily.    Dispense:  20 tablet    Refill:  0    Order Specific Question:   Supervising Provider    Answer:   MILLER, BRIAN [[id number]]  . metroNIDAZOLE (FLAGYL) 500 MG tablet    Sig: Take 1 tablet (500 mg total) by mouth 2 (two) times daily.    Dispense:  14 tablet    Refill:  0    Order Specific Question:   Supervising Provider    Answer:   Noemi Chapel [[id number]]   An After Visit Summary was printed and given to the patient.    Fransico Meadow, PA-C 09/23/16 1623    Duffy Bruce, MD 09/24/16 [phone]

## 2016-09-23 NOTE — ED Notes (Signed)
Bed: LT53 Expected date:  Expected time:  Means of arrival:  Comments: syncope

## 2016-09-23 NOTE — ED Triage Notes (Signed)
Per EMS pt. From home reported of syncopal episode while using the bathroom at 1am today, pt. Has diarrhea with N/V x 3 days. Denied fever. Pt. Found to be hypotensive initially by EMS, 565ml NS given. Alert and oriented upon arrival to ED. Abdominal  pain at 8/10 RUQ. Received 4 mg ODT Zofran via EMS.

## 2016-09-23 NOTE — ED Notes (Signed)
Pt unable to give urine sample, but aware that we need one.  

## 2016-09-25 ENCOUNTER — Ambulatory Visit (INDEPENDENT_AMBULATORY_CARE_PROVIDER_SITE_OTHER): Payer: Medicare Other | Admitting: Family Medicine

## 2016-09-25 ENCOUNTER — Encounter: Payer: Self-pay | Admitting: Family Medicine

## 2016-09-25 DIAGNOSIS — K8043 Calculus of bile duct with acute cholecystitis with obstruction: Secondary | ICD-10-CM | POA: Diagnosis not present

## 2016-09-25 DIAGNOSIS — K529 Noninfective gastroenteritis and colitis, unspecified: Secondary | ICD-10-CM | POA: Insufficient documentation

## 2016-09-25 DIAGNOSIS — K8020 Calculus of gallbladder without cholecystitis without obstruction: Secondary | ICD-10-CM | POA: Insufficient documentation

## 2016-09-25 LAB — CBC WITH DIFFERENTIAL/PLATELET
Basophils Absolute: 0 10*3/uL (ref 0.0–0.1)
Basophils Relative: 0.5 % (ref 0.0–3.0)
Eosinophils Absolute: 0.1 10*3/uL (ref 0.0–0.7)
Eosinophils Relative: 0.9 % (ref 0.0–5.0)
HCT: 36 % (ref 36.0–46.0)
Hemoglobin: 12.4 g/dL (ref 12.0–15.0)
Lymphocytes Relative: 19 % (ref 12.0–46.0)
Lymphs Abs: 1.4 10*3/uL (ref 0.7–4.0)
MCHC: 34.5 g/dL (ref 30.0–36.0)
MCV: 88.1 fl (ref 78.0–100.0)
Monocytes Absolute: 0.9 10*3/uL (ref 0.1–1.0)
Monocytes Relative: 12 % (ref 3.0–12.0)
Neutro Abs: 5 10*3/uL (ref 1.4–7.7)
Neutrophils Relative %: 67.6 % (ref 43.0–77.0)
Platelets: 261 10*3/uL (ref 150.0–400.0)
RBC: 4.09 Mil/uL (ref 3.87–5.11)
RDW: 13.4 % (ref 11.5–15.5)
WBC: 7.5 10*3/uL (ref 4.0–10.5)

## 2016-09-25 LAB — COMPREHENSIVE METABOLIC PANEL
ALT: 29 U/L (ref 0–35)
AST: 35 U/L (ref 0–37)
Albumin: 3.4 g/dL — ABNORMAL LOW (ref 3.5–5.2)
Alkaline Phosphatase: 90 U/L (ref 39–117)
BUN: 11 mg/dL (ref 6–23)
CO2: 27 mEq/L (ref 19–32)
Calcium: 9.4 mg/dL (ref 8.4–10.5)
Chloride: 103 mEq/L (ref 96–112)
Creatinine, Ser: 0.98 mg/dL (ref 0.40–1.20)
GFR: 59.28 mL/min — ABNORMAL LOW (ref >60.00)
Glucose, Bld: 104 mg/dL — ABNORMAL HIGH (ref 70–99)
Potassium: 3.3 mEq/L — ABNORMAL LOW (ref 3.5–5.1)
Sodium: 138 mEq/L (ref 135–145)
Total Bilirubin: 0.3 mg/dL (ref 0.2–1.2)
Total Protein: 6.5 g/dL (ref 6.0–8.3)

## 2016-09-25 NOTE — Patient Instructions (Signed)
Great to see you.  I will call you with your lab results.  Please stop by to see Rosaria Ferries on your way out to schedule your GI appointment.

## 2016-09-25 NOTE — Assessment & Plan Note (Signed)
Check LFTs today. Refer to GI.

## 2016-09-25 NOTE — Assessment & Plan Note (Signed)
Symptoms improved with cipro and flagyl. Will refer to GI for further work up given ?biliary stone/obstruction. Check labs today. Orders Placed This Encounter  Procedures  . Comprehensive metabolic panel  . CBC with Differential/Platelet  . Ambulatory referral to Gastroenterology

## 2016-09-25 NOTE — Progress Notes (Signed)
Subjective:   Patient ID: Heidi Middleton, female    DOB: 30-Mar-1945, 71 y.o.   MRN: [id number]  Heidi Middleton is a pleasant 71 y.o. year old female pt of Dr. Glori Bickers, who presents to clinic today with Hospitalization Follow-up  on 09/25/2016  HPI:  Was seen in the ER on 09/24/26 after she passed out on toilet and husband called EMS. Had been having abdominal pain and diarrhea- notes have not been completed. Black stools.  CT of abdomen was consistent with colitis along with ? Bilary stone with obstruction- MRCP or ERCP recommended.  Hepatic Function Latest Ref Rng & Units 09/23/2016 06/19/2016 08/11/2015  Total Protein 6.5 - 8.1 g/dL 6.6 7.2 7.3  Albumin 3.5 - 5.0 g/dL 3.3(L) 4.2 3.8  AST 15 - 41 U/L 47(H) 25 29  ALT 14 - 54 U/L 35 16 22  Alk Phosphatase 38 - 126 U/L 75 76 80  Total Bilirubin 0.3 - 1.2 mg/dL 0.5 0.6 0.6  Bilirubin, Direct 0.1 - 0.5 mg/dL <0.1(L) - -     CBC, UA unremarkable.   Ct Abdomen Pelvis W Contrast  Result Date: 09/23/2016 CLINICAL DATA:  Acute onset of syncope. Nausea, vomiting and diarrhea. Hypotension. Initial encounter. EXAM: CT ABDOMEN AND PELVIS WITH CONTRAST TECHNIQUE: Multidetector CT imaging of the abdomen and pelvis was performed using the standard protocol following bolus administration of intravenous contrast. CONTRAST:  21m ISOVUE-300 IOPAMIDOL (ISOVUE-300) INJECTION 61% COMPARISON:  None. FINDINGS: Lower chest: Minimal scarring is noted at the lung bases. The visualized portions of the mediastinum are unremarkable. Hepatobiliary: There is mild dilatation of the common bile duct to 1.0 cm in diameter, with a 4 mm stone noted at the distal common bile duct. There is slight prominence of the intrahepatic biliary ducts. The gallbladder is grossly unremarkable, aside from a stone within the gallbladder. The liver is otherwise unremarkable in appearance. Pancreas: The pancreas is within normal limits. Spleen: The spleen is unremarkable in appearance.  Adrenals/Urinary Tract: The adrenal glands are unremarkable in appearance. The kidneys are within normal limits. There is no evidence of hydronephrosis. No renal or ureteral stones are identified. No perinephric stranding is seen. Stomach/Bowel: The stomach is unremarkable in appearance. The small bowel is within normal limits. The patient is status post appendectomy. Vague soft tissue inflammation is noted about the cecum, and mild wall thickening is noted along the splenic flexure of the colon, and along the descending and sigmoid colon, concerning for an acute infectious or inflammatory process. Mild diverticulosis is noted at the mid sigmoid colon, without evidence of diverticulitis. Vascular/Lymphatic: Diffuse calcification is seen along the abdominal aorta and its branches. The abdominal aorta is otherwise grossly unremarkable. The inferior vena cava is grossly unremarkable. No retroperitoneal lymphadenopathy is seen. No pelvic sidewall lymphadenopathy is identified. Reproductive: The bladder is mildly distended and grossly unremarkable. The patient is status post hysterectomy. No suspicious adnexal masses are seen. The left ovary is unremarkable in appearance. Other: No additional soft tissue abnormalities are seen. Musculoskeletal: No acute osseous abnormalities are identified. A calcified posterior disc protrusion is noted at T11-T12, measuring 8 mm. This likely impresses on the underlying spinal cord. The visualized musculature is unremarkable in appearance. IMPRESSION: 1. Vague soft tissue inflammation about the cecum. Mild wall thickening along the splenic flexure of the colon, and along the descending and sigmoid colon, concerning for an acute infectious or inflammatory process. 2. Mild dilatation of the common bile duct to 1.0 cm in diameter, with a 4 mm  stone noted at the distal common bile duct, raising concern for mild obstruction. Slight prominence of the intrahepatic biliary ducts. Cholelithiasis  noted. Would correlate with LFTs, and consider MRCP or ERCP as deemed clinically appropriate. 3. Diffuse aortic atherosclerosis. 4. Calcified posterior disc protrusion at T11-T12, measuring 8 mm. This likely impresses on the underlying spinal cord. If the patient has associated symptoms, MRI of the thoracic spine could be considered for further evaluation. Electronically Signed   By: Garald Balding M.D.   On: 09/23/2016 06:04   Placed on cipro and flagyl and advised to follow up here today.   Colonoscopy in 10/2017- reviewed- polyps.  Today diarrhea has improved.  Abdominal pain improving but she is still in "quite a bit of pain." Current Outpatient Prescriptions on File Prior to Visit  Medication Sig Dispense Refill  . acetaminophen (TYLENOL) 325 MG tablet Take 650 mg by mouth every 6 (six) hours as needed for mild pain, moderate pain, fever or headache.    Marland Kitchen aspirin EC 325 MG tablet Take 325 mg by mouth daily.    Marland Kitchen atorvastatin (LIPITOR) 10 MG tablet TAKE 0.5 TABLETS (5 MG TOTAL) BY MOUTH DAILY. (Patient taking differently: Take 5 mg by mouth at bedtime. ) 45 tablet 3  . cholecalciferol (VITAMIN D) 1000 UNITS tablet Take 1,000 Units by mouth 2 (two) times daily.     . ciprofloxacin (CIPRO) 500 MG tablet Take 1 tablet (500 mg total) by mouth 2 (two) times daily. 20 tablet 0  . esomeprazole (NEXIUM) 40 MG capsule TAKE 1 CAPSULE (40 MG TOTAL) BY MOUTH DAILY BEFORE BREAKFAST. (Patient taking differently: Take 40 mg by mouth daily before breakfast. ) 90 capsule 3  . fluticasone (FLONASE) 50 MCG/ACT nasal spray Place 1-2 sprays into both nostrils daily as needed for rhinitis.    Marland Kitchen lisinopril (PRINIVIL,ZESTRIL) 5 MG tablet Take 1 tablet (5 mg total) by mouth daily. 30 tablet 3  . metoprolol tartrate (LOPRESSOR) 25 MG tablet Take 0.5 tablets (12.5 mg total) by mouth 2 (two) times daily. 30 tablet 6  . metroNIDAZOLE (FLAGYL) 500 MG tablet Take 1 tablet (500 mg total) by mouth 2 (two) times daily. 14  tablet 0  . Multiple Vitamin (MULTIVITAMIN WITH MINERALS) TABS tablet Take 1 tablet by mouth daily.    Marland Kitchen omega-3 acid ethyl esters (LOVAZA) 1 g capsule Take 1 g by mouth 2 (two) times daily.     No current facility-administered medications on file prior to visit.     Allergies  Allergen Reactions  . Codeine Nausea And Vomiting    Past Medical History:  Diagnosis Date  . Aortic stenosis   . Colonic polyp   . GERD (gastroesophageal reflux disease)   . Headache    migraines prior to hysterectomy  . Hypertension   . PONV (postoperative nausea and vomiting)    nausea  . Shingles     Past Surgical History:  Procedure Laterality Date  . ABDOMINAL HYSTERECTOMY    . AORTIC VALVE REPLACEMENT N/A 08/16/2015   Procedure: AORTIC VALVE REPLACEMENT (AVR);  Surgeon: Ivin Poot, MD;  Location: [facility];  Service: Open Heart Surgery;  Laterality: N/A;  . APPENDECTOMY     taken out with hysterectomy  . BREAST BIOPSY Right 1990   EXCISIONAL - NEG  . CARDIAC CATHETERIZATION Bilateral 08/04/2015   Procedure: Right/Left Heart Cath and Coronary Angiography;  Surgeon: Wellington Hampshire, MD;  Location: [place] [facility];  Service: Cardiovascular;  Laterality: Bilateral;  . EYE  SURGERY N/A    detatched retina  . KNEE SURGERY  10/2005   calcinosis - in office procedure  . TEE WITHOUT CARDIOVERSION N/A 08/16/2015   Procedure: TRANSESOPHAGEAL ECHOCARDIOGRAM (TEE);  Surgeon: Ivin Poot, MD;  Location: [facility];  Service: Open Heart Surgery;  Laterality: N/A;    Family History  Problem Relation Age of Onset  . Cancer Mother        lung  . Stroke Mother   . Cancer Father        pancreatic  . Heart disease Sister   . Hypertension Brother   . Cancer Brother   . Diabetes Maternal Grandmother   . Hypertension Brother   . Obesity Sister   . Lung cancer Brother   . Breast cancer Neg Hx     Social History   Social History  . Marital status: Married    Spouse name: N/A  . Number of  children: 3  . Years of education: N/A   Occupational History  . Teacher    Social History Main Topics  . Smoking status: Former Smoker    Quit date: 07/07/2002  . Smokeless tobacco: Never Used  . Alcohol use No  . Drug use: No  . Sexual activity: No   Other Topics Concern  . Not on file   Social History Narrative  . No narrative on file   The PMH, PSH, Social History, Family History, Medications, and allergies have been reviewed in [facility], and have been updated if relevant.   Review of Systems  Constitutional: Negative.   HENT: Negative.   Respiratory: Negative.   Cardiovascular: Negative.   Gastrointestinal: Positive for diarrhea. Negative for blood in stool, constipation, nausea, rectal pain and vomiting.  Genitourinary: Negative.   Neurological: Negative.   Hematological: Negative.   Psychiatric/Behavioral: Negative.   All other systems reviewed and are negative.      Objective:    BP 120/70   Pulse 77   Ht 5' 7.25" (1.708 m)   Wt 166 lb (75.3 kg)   SpO2 97%   BMI 25.81 kg/m   Wt Readings from Last 3 Encounters:  09/25/16 166 lb (75.3 kg)  06/26/16 168 lb 8 oz (76.4 kg)  06/19/16 167 lb 4 oz (75.9 kg)    Physical Exam  Constitutional: She is oriented to person, place, and time. She appears well-developed and well-nourished. No distress.  HENT:  Head: Normocephalic and atraumatic.  Eyes: Conjunctivae are normal.  Cardiovascular: Normal rate.   Pulmonary/Chest: Effort normal.  Abdominal: Soft. Bowel sounds are normal. She exhibits no distension. There is no tenderness. There is no rebound and no guarding.  Neurological: She is alert and oriented to person, place, and time. No cranial nerve deficit.  Skin: Skin is warm and dry. She is not diaphoretic.  Psychiatric: She has a normal mood and affect. Her behavior is normal. Judgment and thought content normal.  Nursing note and vitals reviewed.         Assessment & Plan:   Colitis - Plan: Ambulatory  referral to Gastroenterology  Calculus of bile duct with acute cholecystitis and obstruction - Plan: Ambulatory referral to Gastroenterology No Follow-up on file.

## 2016-09-26 ENCOUNTER — Encounter: Payer: Self-pay | Admitting: Gastroenterology

## 2016-09-26 ENCOUNTER — Ambulatory Visit (INDEPENDENT_AMBULATORY_CARE_PROVIDER_SITE_OTHER): Payer: Medicare Other | Admitting: Gastroenterology

## 2016-09-26 VITALS — BP 130/66 | HR 76 | Ht 67.25 in | Wt 167.4 lb

## 2016-09-26 DIAGNOSIS — R933 Abnormal findings on diagnostic imaging of other parts of digestive tract: Secondary | ICD-10-CM

## 2016-09-26 DIAGNOSIS — R1032 Left lower quadrant pain: Secondary | ICD-10-CM | POA: Diagnosis not present

## 2016-09-26 DIAGNOSIS — R197 Diarrhea, unspecified: Secondary | ICD-10-CM

## 2016-09-26 DIAGNOSIS — R932 Abnormal findings on diagnostic imaging of liver and biliary tract: Secondary | ICD-10-CM

## 2016-09-26 MED ORDER — DICYCLOMINE HCL 10 MG PO CAPS: 10.0000 mg | ORAL_CAPSULE | Freq: Three times a day (TID) | ORAL | 0 refills | Status: DC | PRN

## 2016-09-26 NOTE — Patient Instructions (Addendum)
Remain on bland diet including caffeine free, low fat and lactose free. Advance diet as tolerated.   We have sent the following medications to your pharmacy for you to pick up at your convenience: Bentyl.   You have been scheduled for an MRI/MRCP at [facility] (beside hospital [place]on 10/03/16. Your appointment time is 10:00am. Please arrive 15 minutes prior to your appointment time for registration purposes. Please make certain not to have anything to eat or drink 3 hours prior to your test. In addition, if you have any metal in your body, have a pacemaker or defibrillator, please be sure to let your ordering physician know. This test typically takes 45 minutes to 1 hour to complete. Should you need to reschedule, please call [phone] to do so.  Thank you for choosing me and [facility].  Pricilla Riffle. Dagoberto Ligas., MD., [facility] Regal

## 2016-09-26 NOTE — Progress Notes (Addendum)
History of Present Illness: This is a 71 year old female referred by Tower, Heidi Fanny, MD for the evaluation of abnormal abdominal CT scan, diarrhea and abdominal pain. On July 4 she developed the onset of diarrhea and nausea vomiting lower abdominal discomfort and low-grade fevers. The symptoms intensified and worsened steadily until she had a syncopal episode and presented to the emergency department via EMS. CT scan below. AST=47 and other LFTs normal. An abnormal BMET with sodium of 134, an elevated BUN/creatinine, low CO2. She was place cipro and flagyl. She has had substantial improvement in her diarrhea now only several times a day from over 20 times per day. Her abdominal pain is improved she has minimal left lower quadrant pain. Nausea and vomiting and low-grade fevers have completely resolved and she is feeling quite well and tolerating a bland diet. Repeat CMP yesterday showed normal LFTs, potassium low at 3.3, albumin low at 3.4, otherwise unremarkable. Denies weight loss, constipation, change in stool caliber, melena, hematochezia, dysphagia, reflux symptoms, chest pain.  Abd/pelvic CT IMPRESSION: 1. Vague soft tissue inflammation about the cecum. Mild wall thickening along the splenic flexure of the colon, and along the descending and sigmoid colon, concerning for an acute infectious or inflammatory process. 2. Mild dilatation of the common bile duct to 1.0 cm in diameter, with a 4 mm stone noted at the distal common bile duct, raising concern for mild obstruction. Slight prominence of the intrahepatic biliary ducts. Cholelithiasis noted. Would correlate with LFTs, and consider MRCP or ERCP as deemed clinically appropriate. 3. Diffuse aortic atherosclerosis. 4. Calcified posterior disc protrusion at T11-T12, measuring 8 mm. This likely impresses on the underlying spinal cord. If the patient has associated symptoms, MRI of the thoracic spine could be considered for further  evaluation.   Colonoscopy 10/2007: severe left colon diverticulosis  EGD 06/2002: normal    Allergies  Allergen Reactions  . Codeine Nausea And Vomiting   Outpatient Medications Prior to Visit  Medication Sig Dispense Refill  . acetaminophen (TYLENOL) 325 MG tablet Take 650 mg by mouth every 6 (six) hours as needed for mild pain, moderate pain, fever or headache.    Marland Kitchen aspirin EC 325 MG tablet Take 325 mg by mouth daily.    Marland Kitchen atorvastatin (LIPITOR) 10 MG tablet TAKE 0.5 TABLETS (5 MG TOTAL) BY MOUTH DAILY. (Patient taking differently: Take 5 mg by mouth at bedtime. ) 45 tablet 3  . cholecalciferol (VITAMIN D) 1000 UNITS tablet Take 1,000 Units by mouth 2 (two) times daily.     . ciprofloxacin (CIPRO) 500 MG tablet Take 1 tablet (500 mg total) by mouth 2 (two) times daily. 20 tablet 0  . esomeprazole (NEXIUM) 40 MG capsule TAKE 1 CAPSULE (40 MG TOTAL) BY MOUTH DAILY BEFORE BREAKFAST. (Patient taking differently: Take 40 mg by mouth daily before breakfast. ) 90 capsule 3  . fluticasone (FLONASE) 50 MCG/ACT nasal spray Place 1-2 sprays into both nostrils daily as needed for rhinitis.    Marland Kitchen lisinopril (PRINIVIL,ZESTRIL) 5 MG tablet Take 1 tablet (5 mg total) by mouth daily. 30 tablet 3  . metoprolol tartrate (LOPRESSOR) 25 MG tablet Take 0.5 tablets (12.5 mg total) by mouth 2 (two) times daily. 30 tablet 6  . metroNIDAZOLE (FLAGYL) 500 MG tablet Take 1 tablet (500 mg total) by mouth 2 (two) times daily. 14 tablet 0  . Multiple Vitamin (MULTIVITAMIN WITH MINERALS) TABS tablet Take 1 tablet by mouth daily.    Marland Kitchen omega-3 acid ethyl esters (  LOVAZA) 1 g capsule Take 1 g by mouth 2 (two) times daily.     No facility-administered medications prior to visit.    Past Medical History:  Diagnosis Date  . Adenomatous colon polyp 05/1985  . Aortic stenosis   . Diverticulosis   . GERD (gastroesophageal reflux disease)   . Headache    migraines prior to hysterectomy  . Hypertension   . PONV  (postoperative nausea and vomiting)    nausea  . Shingles    Past Surgical History:  Procedure Laterality Date  . ABDOMINAL HYSTERECTOMY    . AORTIC VALVE REPLACEMENT N/A 08/16/2015   Procedure: AORTIC VALVE REPLACEMENT (AVR);  Surgeon: Ivin Poot, MD;  Location: [facility];  Service: Open Heart Surgery;  Laterality: N/A;  . APPENDECTOMY     taken out with hysterectomy  . BREAST BIOPSY Right 1990   EXCISIONAL - NEG  . CARDIAC CATHETERIZATION Bilateral 08/04/2015   Procedure: Right/Left Heart Cath and Coronary Angiography;  Surgeon: Wellington Hampshire, MD;  Location: [place] [facility];  Service: Cardiovascular;  Laterality: Bilateral;  . EYE SURGERY N/A    detatched retina  . KNEE SURGERY  10/2005   calcinosis - in office procedure  . TEE WITHOUT CARDIOVERSION N/A 08/16/2015   Procedure: TRANSESOPHAGEAL ECHOCARDIOGRAM (TEE);  Surgeon: Ivin Poot, MD;  Location: [facility];  Service: Open Heart Surgery;  Laterality: N/A;   Social History   Social History  . Marital status: Married    Spouse name: N/A  . Number of children: 3  . Years of education: N/A   Occupational History  . Teacher    Social History Main Topics  . Smoking status: Former Smoker    Quit date: 07/07/2002  . Smokeless tobacco: Never Used  . Alcohol use No  . Drug use: No  . Sexual activity: No   Other Topics Concern  . None   Social History Narrative  . None   Family History  Problem Relation Age of Onset  . Cancer Mother        lung  . Stroke Mother   . Cancer Father        pancreatic  . Heart disease Sister   . Hypertension Brother   . Cancer Brother   . Diabetes Maternal Grandmother   . Hypertension Brother   . Obesity Sister   . Lung cancer Brother   . Breast cancer Neg Hx       Review of Systems: Pertinent positive and negative review of systems were noted in the above HPI section. All other review of systems were otherwise negative.   Physical Exam: General: Well developed, well  nourished, no acute distress Head: Normocephalic and bruising, abrasions, small lacerations around the bridge of nose and eyes Eyes:  sclerae anicteric, EOMI Ears: Normal auditory acuity Mouth: No deformity or lesions Neck: Supple, no masses or thyromegaly Lungs: Clear throughout to auscultation Heart: Regular rate and rhythm; no murmurs, rubs or bruits Abdomen: Soft, minimal LLQ tenderness and non distended. No masses, hepatosplenomegaly or hernias noted. Normal Bowel sounds Rectal: not done Musculoskeletal: Symmetrical with no gross deformities  Skin: No lesions on visible extremities Pulses:  Normal pulses noted Extremities: No clubbing, cyanosis, edema or deformities noted Neurological: Alert oriented x 4, grossly nonfocal Cervical Nodes:  No significant cervical adenopathy Inguinal Nodes: No significant inguinal adenopathy Psychological:  Alert and cooperative. Normal mood and affect   Assessment and Recommendations:  1. Presumed infectious colitis leading to current symptoms and  abnormal colon findings on CT scan.. Stool studies not obtained. Complete course of Cipro and Flagyl as prescribed. Begin Bentyl 10 mg 3 times a day when necessary abdominal pain, cramping.Bland low-fat, lactose free diet for now and advance slowly as tolerated. REV in 2-3 weeks.   2. Cholelithiasis, mild CBD dilation, suspected choledocholithiasis. AST was minimally elevated and now normal. All other LFTs are normal. Schedule MRCP to further. ERCP could be necessary. The risks benefits and alternatives to ERCP with possible sphincterotomy, possible stent placement were discussed with the patient and she consents to proceed if necessary. As she is currently asymptomatic from a biliary standpoint we will delay ERCP, if it is necessary, until her acute illness has resolved. Cholecystectomy ultimately may be required as well.   cc: Abner Greenspan, MD [address] [place], [place] [address]

## 2016-10-03 ENCOUNTER — Ambulatory Visit (HOSPITAL_COMMUNITY)
Admission: RE | Admit: 2016-10-03 | Discharge: 2016-10-03 | Disposition: A | Discharge status: Home / Self Care | Payer: Medicare Other | Source: Ambulatory Visit | Attending: Gastroenterology | Admitting: Gastroenterology

## 2016-10-03 ENCOUNTER — Other Ambulatory Visit: Payer: Self-pay | Admitting: Gastroenterology

## 2016-10-03 DIAGNOSIS — K838 Other specified diseases of biliary tract: Secondary | ICD-10-CM | POA: Insufficient documentation

## 2016-10-03 DIAGNOSIS — R932 Abnormal findings on diagnostic imaging of liver and biliary tract: Secondary | ICD-10-CM | POA: Insufficient documentation

## 2016-10-03 DIAGNOSIS — K8070 Calculus of gallbladder and bile duct without cholecystitis without obstruction: Secondary | ICD-10-CM | POA: Diagnosis not present

## 2016-10-03 MED ORDER — GADOBENATE DIMEGLUMINE 529 MG/ML IV SOLN
15.0000 mL | Freq: Once | INTRAVENOUS | Status: AC | PRN
  Administered 2016-10-03: 14 mL via INTRAVENOUS

## 2016-10-04 ENCOUNTER — Other Ambulatory Visit: Payer: Self-pay

## 2016-10-04 DIAGNOSIS — K8050 Calculus of bile duct without cholangitis or cholecystitis without obstruction: Secondary | ICD-10-CM

## 2016-10-04 NOTE — Progress Notes (Signed)
Dr. Fuller Plan ERCP is currently scheduled for Wed 8/15 at 9:00 at [facility].  They do not have MAC at [facility] on either day in the am.  They could do the ERCP on Tues in the pm sometime after 12:00.    Before I contact the patient would you rather me move it to [facility] on 8/14 in the pm or leave at [facility] on 8/15 9:00?

## 2016-10-04 NOTE — Addendum Note (Signed)
Addended by: Marlon Pel on: 10/04/2016 02:16 PM   Modules accepted: Orders

## 2016-10-04 NOTE — Progress Notes (Signed)
As scheduled at [facility] is fine.

## 2016-10-30 ENCOUNTER — Encounter (HOSPITAL_COMMUNITY): Payer: Self-pay | Admitting: *Deleted

## 2016-10-31 ENCOUNTER — Ambulatory Visit (HOSPITAL_COMMUNITY)
Admission: RE | Admit: 2016-10-31 | Discharge: 2016-10-31 | Disposition: A | Discharge status: Home / Self Care | Payer: Medicare Other | Source: Ambulatory Visit | Attending: Gastroenterology | Admitting: Gastroenterology

## 2016-10-31 ENCOUNTER — Ambulatory Visit (HOSPITAL_COMMUNITY): Payer: Medicare Other | Admitting: Certified Registered Nurse Anesthetist

## 2016-10-31 ENCOUNTER — Ambulatory Visit (HOSPITAL_COMMUNITY): Payer: Medicare Other

## 2016-10-31 ENCOUNTER — Encounter (HOSPITAL_COMMUNITY)
Admission: RE | Disposition: A | Discharge status: Home / Self Care | Payer: Self-pay | Source: Ambulatory Visit | Attending: Gastroenterology

## 2016-10-31 ENCOUNTER — Encounter (HOSPITAL_COMMUNITY): Payer: Self-pay | Admitting: Gastroenterology

## 2016-10-31 DIAGNOSIS — K8050 Calculus of bile duct without cholangitis or cholecystitis without obstruction: Secondary | ICD-10-CM

## 2016-10-31 DIAGNOSIS — Z809 Family history of malignant neoplasm, unspecified: Secondary | ICD-10-CM | POA: Diagnosis not present

## 2016-10-31 DIAGNOSIS — Z823 Family history of stroke: Secondary | ICD-10-CM | POA: Insufficient documentation

## 2016-10-31 DIAGNOSIS — I10 Essential (primary) hypertension: Secondary | ICD-10-CM | POA: Insufficient documentation

## 2016-10-31 DIAGNOSIS — K219 Gastro-esophageal reflux disease without esophagitis: Secondary | ICD-10-CM | POA: Insufficient documentation

## 2016-10-31 DIAGNOSIS — Z800 Family history of malignant neoplasm of digestive organs: Secondary | ICD-10-CM | POA: Insufficient documentation

## 2016-10-31 DIAGNOSIS — Z8489 Family history of other specified conditions: Secondary | ICD-10-CM | POA: Diagnosis not present

## 2016-10-31 DIAGNOSIS — Z885 Allergy status to narcotic agent status: Secondary | ICD-10-CM | POA: Insufficient documentation

## 2016-10-31 DIAGNOSIS — Z801 Family history of malignant neoplasm of trachea, bronchus and lung: Secondary | ICD-10-CM | POA: Insufficient documentation

## 2016-10-31 DIAGNOSIS — K838 Other specified diseases of biliary tract: Secondary | ICD-10-CM | POA: Diagnosis not present

## 2016-10-31 DIAGNOSIS — Z8619 Personal history of other infectious and parasitic diseases: Secondary | ICD-10-CM | POA: Diagnosis not present

## 2016-10-31 DIAGNOSIS — Q2733 Arteriovenous malformation of digestive system vessel: Secondary | ICD-10-CM | POA: Insufficient documentation

## 2016-10-31 DIAGNOSIS — Z8249 Family history of ischemic heart disease and other diseases of the circulatory system: Secondary | ICD-10-CM | POA: Insufficient documentation

## 2016-10-31 DIAGNOSIS — Z87891 Personal history of nicotine dependence: Secondary | ICD-10-CM | POA: Diagnosis not present

## 2016-10-31 DIAGNOSIS — Z833 Family history of diabetes mellitus: Secondary | ICD-10-CM | POA: Diagnosis not present

## 2016-10-31 DIAGNOSIS — K5790 Diverticulosis of intestine, part unspecified, without perforation or abscess without bleeding: Secondary | ICD-10-CM | POA: Insufficient documentation

## 2016-10-31 DIAGNOSIS — Z952 Presence of prosthetic heart valve: Secondary | ICD-10-CM | POA: Diagnosis not present

## 2016-10-31 DIAGNOSIS — Z86010 Personal history of colonic polyps: Secondary | ICD-10-CM | POA: Insufficient documentation

## 2016-10-31 DIAGNOSIS — Z90710 Acquired absence of both cervix and uterus: Secondary | ICD-10-CM | POA: Diagnosis not present

## 2016-10-31 DIAGNOSIS — Z79899 Other long term (current) drug therapy: Secondary | ICD-10-CM | POA: Insufficient documentation

## 2016-10-31 DIAGNOSIS — Z7982 Long term (current) use of aspirin: Secondary | ICD-10-CM | POA: Insufficient documentation

## 2016-10-31 HISTORY — DX: Cardiac murmur, unspecified: R01.1

## 2016-10-31 HISTORY — PX: ERCP: SHX5425

## 2016-10-31 SURGERY — ERCP, WITH INTERVENTION IF INDICATED
Anesthesia: General

## 2016-10-31 MED ORDER — GLUCAGON HCL RDNA (DIAGNOSTIC) 1 MG IJ SOLR
INTRAMUSCULAR | Status: AC
  Filled 2016-10-31: qty 1

## 2016-10-31 MED ORDER — IOPAMIDOL (ISOVUE-370) INJECTION 76%
INTRAVENOUS | Status: DC | PRN
  Administered 2016-10-31: 50 mL via INTRAVENOUS

## 2016-10-31 MED ORDER — SCOPOLAMINE 1 MG/3DAYS TD PT72
1.0000 | MEDICATED_PATCH | TRANSDERMAL | Status: DC
  Administered 2016-10-31: 1 via TRANSDERMAL
  Filled 2016-10-31: qty 1

## 2016-10-31 MED ORDER — SUGAMMADEX SODIUM 500 MG/5ML IV SOLN
INTRAVENOUS | Status: DC | PRN
  Administered 2016-10-31: 300 mg via INTRAVENOUS

## 2016-10-31 MED ORDER — DEXAMETHASONE SODIUM PHOSPHATE 10 MG/ML IJ SOLN
INTRAMUSCULAR | Status: DC | PRN
  Administered 2016-10-31: 10 mg via INTRAVENOUS

## 2016-10-31 MED ORDER — SODIUM CHLORIDE 0.9 % IV SOLN
1.5000 g | Freq: Once | INTRAVENOUS | Status: AC
  Administered 2016-10-31: 1.5 g via INTRAVENOUS
  Filled 2016-10-31: qty 1.5

## 2016-10-31 MED ORDER — GLUCAGON HCL RDNA (DIAGNOSTIC) 1 MG IJ SOLR
INTRAMUSCULAR | Status: DC | PRN
  Administered 2016-10-31 (×2): 0.25 mg via INTRAVENOUS

## 2016-10-31 MED ORDER — ROCURONIUM BROMIDE 10 MG/ML (PF) SYRINGE
PREFILLED_SYRINGE | INTRAVENOUS | Status: DC | PRN
  Administered 2016-10-31: 30 mg via INTRAVENOUS
  Administered 2016-10-31: 10 mg via INTRAVENOUS

## 2016-10-31 MED ORDER — PROPOFOL 10 MG/ML IV BOLUS
INTRAVENOUS | Status: DC | PRN
  Administered 2016-10-31: 20 mg via INTRAVENOUS
  Administered 2016-10-31: 130 mg via INTRAVENOUS

## 2016-10-31 MED ORDER — PHENYLEPHRINE HCL 10 MG/ML IJ SOLN
INTRAVENOUS | Status: DC | PRN
  Administered 2016-10-31: 50 ug/min via INTRAVENOUS

## 2016-10-31 MED ORDER — LACTATED RINGERS IV SOLN
INTRAVENOUS | Status: AC | PRN
  Administered 2016-10-31: 1000 mL via INTRAVENOUS

## 2016-10-31 MED ORDER — SCOPOLAMINE 1 MG/3DAYS TD PT72: 1.0000 | MEDICATED_PATCH | TRANSDERMAL | 12 refills | Status: DC

## 2016-10-31 MED ORDER — LIDOCAINE 2% (20 MG/ML) 5 ML SYRINGE
INTRAMUSCULAR | Status: DC | PRN
  Administered 2016-10-31: 100 mg via INTRAVENOUS

## 2016-10-31 MED ORDER — FENTANYL CITRATE (PF) 250 MCG/5ML IJ SOLN
INTRAMUSCULAR | Status: DC | PRN
  Administered 2016-10-31: 50 ug via INTRAVENOUS

## 2016-10-31 MED ORDER — INDOMETHACIN 50 MG RE SUPP
RECTAL | Status: AC
  Filled 2016-10-31: qty 2

## 2016-10-31 MED ORDER — ONDANSETRON HCL 4 MG/2ML IJ SOLN
INTRAMUSCULAR | Status: DC | PRN
  Administered 2016-10-31: 4 mg via INTRAVENOUS

## 2016-10-31 MED ORDER — MIDAZOLAM HCL 2 MG/2ML IJ SOLN
INTRAMUSCULAR | Status: DC | PRN
  Administered 2016-10-31: 1 mg via INTRAVENOUS

## 2016-10-31 MED ORDER — INDOMETHACIN 50 MG RE SUPP: 100.0000 mg | Freq: Once | RECTAL | Status: DC

## 2016-10-31 MED ORDER — SODIUM CHLORIDE 0.9 % IV SOLN: INTRAVENOUS | Status: DC

## 2016-10-31 MED ORDER — IOPAMIDOL (ISOVUE-300) INJECTION 61%
INTRAVENOUS | Status: AC
  Filled 2016-10-31: qty 50

## 2016-10-31 MED ORDER — INDOMETHACIN 50 MG RE SUPP
RECTAL | Status: DC | PRN
  Administered 2016-10-31: 100 mg via RECTAL

## 2016-10-31 NOTE — H&P (View-Only) (Signed)
As scheduled at [facility] is fine.

## 2016-10-31 NOTE — Anesthesia Preprocedure Evaluation (Addendum)
Anesthesia Evaluation  Patient identified by MRN, date of birth, ID band Patient awake    Reviewed: Allergy & Precautions, NPO status , Patient's Chart, lab work & pertinent test results  History of Anesthesia Complications (+) PONV  Airway Mallampati: II  TM Distance: >3 FB Neck ROM: Full    Dental no notable dental hx. (+) Caps   Pulmonary neg pulmonary ROS, former smoker,    Pulmonary exam normal breath sounds clear to auscultation       Cardiovascular hypertension, Pt. on medications Normal cardiovascular exam Rhythm:Regular Rate:Normal  S/p AVR   Neuro/Psych negative neurological ROS  negative psych ROS   GI/Hepatic negative GI ROS, Neg liver ROS,   Endo/Other  negative endocrine ROS  Renal/GU negative Renal ROS  negative genitourinary   Musculoskeletal negative musculoskeletal ROS (+)   Abdominal   Peds negative pediatric ROS (+)  Hematology negative hematology ROS (+)   Anesthesia Other Findings   Reproductive/Obstetrics negative OB ROS                            Anesthesia Physical Anesthesia Plan  ASA: II  Anesthesia Plan: General   Post-op Pain Management:    Induction: Intravenous  PONV Risk Score and Plan: 4 or greater and Ondansetron, Dexamethasone, Midazolam and Scopolamine patch - Pre-op  Airway Management Planned: Oral ETT  Additional Equipment:   Intra-op Plan:   Post-operative Plan: Extubation in OR  Informed Consent: I have reviewed the patients History and Physical, chart, labs and discussed the procedure including the risks, benefits and alternatives for the proposed anesthesia with the patient or authorized representative who has indicated his/her understanding and acceptance.   Dental advisory given  Plan Discussed with: CRNA  Anesthesia Plan Comments:         Anesthesia Quick Evaluation

## 2016-10-31 NOTE — Transfer of Care (Signed)
Immediate Anesthesia Transfer of Care Note  Patient: Heidi Middleton  Procedure(s) Performed: Procedure(s): ENDOSCOPIC RETROGRADE CHOLANGIOPANCREATOGRAPHY (ERCP) (N/A)  Patient Location: Endoscopy Unit  Anesthesia Type:General  Level of Consciousness: awake, drowsy and patient cooperative  Airway & Oxygen Therapy: Patient Spontanous Breathing and Patient connected to nasal cannula oxygen  Post-op Assessment: Report given to RN, Post -op Vital signs reviewed and stable and Patient moving all extremities X 4  Post vital signs: Reviewed and stable  Last Vitals:  Vitals:   10/31/16 0750 10/31/16 0800  BP:    Pulse: 66 63  Resp: 14 16  Temp:    SpO2: 98% 97%    Last Pain: There were no vitals filed for this visit.       Complications: No apparent anesthesia complications

## 2016-10-31 NOTE — Anesthesia Postprocedure Evaluation (Signed)
Anesthesia Post Note  Patient: Heidi Middleton  Procedure(s) Performed: Procedure(s) (LRB): ENDOSCOPIC RETROGRADE CHOLANGIOPANCREATOGRAPHY (ERCP) (N/A)     Patient location during evaluation: PACU Anesthesia Type: General Level of consciousness: awake and alert Pain management: pain level controlled Vital Signs Assessment: post-procedure vital signs reviewed and stable Respiratory status: spontaneous breathing, nonlabored ventilation, respiratory function stable and patient connected to nasal cannula oxygen Cardiovascular status: blood pressure returned to baseline and stable Postop Assessment: no signs of nausea or vomiting Anesthetic complications: no    Last Vitals:  Vitals:   10/31/16 1024 10/31/16 1034  BP: (!) 155/80 (!) 176/59  Pulse: 73 69  Resp: 14 14  Temp:    SpO2: 100% 100%    Last Pain:  Vitals:   10/31/16 1016  TempSrc: Oral                 Montez Hageman

## 2016-10-31 NOTE — Anesthesia Procedure Notes (Signed)
Procedure Name: Intubation Date/Time: 10/31/2016 9:29 AM Performed by: Mervyn Gay Pre-anesthesia Checklist: Patient identified, Patient being monitored, Timeout performed, Emergency Drugs available and Suction available Patient Re-evaluated:Patient Re-evaluated prior to induction Oxygen Delivery Method: Circle System Utilized Preoxygenation: Pre-oxygenation with 100% oxygen Induction Type: IV induction Ventilation: Mask ventilation without difficulty Laryngoscope Size: Miller and 3 Grade View: Grade I Tube type: Oral Tube size: 7.5 mm Number of attempts: 1 Airway Equipment and Method: Stylet Placement Confirmation: ETT inserted through vocal cords under direct vision,  positive ETCO2 and breath sounds checked- equal and bilateral Secured at: 20 cm Tube secured with: Tape Dental Injury: Teeth and Oropharynx as per pre-operative assessment

## 2016-10-31 NOTE — Discharge Instructions (Signed)
Endoscopic Retrograde Cholangiopancreatogram, Care After °This sheet gives you information about how to care for yourself after your procedure. Your health care provider may also give you more specific instructions. If you have problems or questions, contact your health care provider. °What can I expect after the procedure? °After the procedure, it is common to have: °· Soreness in your throat. °· Nausea. °· Bloating. °· Dizziness. °· Tiredness (fatigue). ° °Follow these instructions at home: °· Take over-the-counter and prescription medicines only as told by your health care provider. °· Do not drive for 24 hours if you were given a medicine to help you relax (sedative) during your procedure. Have someone stay with you for 24 hours after the procedure. °· Return to your normal activities as told by your health care provider. Ask your health care provider what activities are safe for you. °· Return to eating what you normally do as soon as you feel well enough or as told by your health care provider. °· Keep all follow-up visits as told by your health care provider. This is important. °Contact a health care provider if: °· You have pain in your abdomen that does not get better with medicine. °· You develop signs of infection, such as: °? Chills. °? Feeling unwell. °Get help right away if: °· You have difficulty swallowing. °· You have worsening pain in your throat, chest, or abdomen. °· You vomit bright red blood or a substance that looks like coffee grounds. °· You have bloody or very black stools. °· You have a fever. °· You have a sudden increase in swelling (bloating) in your abdomen. °Summary °· After the procedure, it is common to feel tired and to have some discomfort in your throat. °· Contact your health care provider if you have signs of infection--such as chills or feeling unwell--or if you have pain that does not improve with medicine. °· Get help right away if you have trouble swallowing, worsening  pain, bloody or black vomit, bloody or black stools, a fever, or increased swelling in your abdomen. °· Keep all follow-up visits as told by your health care provider. This is important. °This information is not intended to replace advice given to you by your health care provider. Make sure you discuss any questions you have with your health care provider. °Document Released: 12/24/2012 Document Revised: 01/23/2016 Document Reviewed: 01/23/2016 °Elsevier Interactive Patient Education © 2017 [employer] ° °

## 2016-10-31 NOTE — Interval H&P Note (Signed)
History and Physical Interval Note:  10/31/2016 9:16 AM  Heidi Middleton  has presented today for surgery, with the diagnosis of choledocholithiasis  The various methods of treatment have been discussed with the patient and family. After consideration of risks, benefits and other options for treatment, the patient has consented to  Procedure(s): ENDOSCOPIC RETROGRADE CHOLANGIOPANCREATOGRAPHY (ERCP) (N/A) as a surgical intervention .  The patient's history has been reviewed, patient examined, no change in status, stable for surgery.  I have reviewed the patient's chart and labs.  Questions were answered to the patient's satisfaction.     Pricilla Riffle. Fuller Plan

## 2016-10-31 NOTE — Op Note (Signed)
[facility] Patient Name: Heidi Middleton Procedure Date : 10/31/2016 MRN: [id number] Attending MD: Ladene Artist , MD Date of Birth: 01/11/1946 CSN: [id number] Age: 71 Admit Type: Outpatient Procedure:                ERCP Indications:              Bile duct stone(s) Providers:                Pricilla Riffle. Fuller Plan, MD, Kingsley Plan, RN, Angus Seller, Elspeth Cho Tech., Technician, Cathe Mons, CRNA Referring MD:             Wynelle Fanny. Tower, MD Medicines:                General Anesthesia Complications:            No immediate complications. Estimated Blood Loss:     Estimated blood loss: none. Procedure:                Pre-Anesthesia Assessment:                           - Prior to the procedure, a History and Physical                            was performed, and patient medications and                            allergies were reviewed. The patient's tolerance of                            previous anesthesia was also reviewed. The risks                            and benefits of the procedure and the sedation                            options and risks were discussed with the patient.                            All questions were answered, and informed consent                            was obtained. Prior Anticoagulants: The patient has                            taken no previous anticoagulant or antiplatelet                            agents. ASA Grade Assessment: II - A patient with  mild systemic disease. After reviewing the risks                            and benefits, the patient was deemed in                            satisfactory condition to undergo the procedure.                           After obtaining informed consent, the scope was                            passed under direct vision. Throughout the                            procedure, the patient's blood pressure,  pulse, and                            oxygen saturations were monitored continuously. The                            Duodenoscope was introduced through the mouth, and                            used to inject contrast into and used to inject                            contrast into the bile duct. The ERCP was                            accomplished without difficulty. The patient                            tolerated the procedure well. Scope In: Scope Out: Findings:      The scout film was normal. The esophagus was successfully intubated       under direct vision. The scope was advanced to a normal major papilla in       the descending duodenum without detailed examination of the pharynx,       larynx and associated structures, and upper GI tract. 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 The upper GI tract       was otherewise grossly normal. A straight Roadrunner wire was passed       into the biliary tree. One pass was made into the PD prior to biliary       access. The short-nosed traction sphincterotome was passed over the       guidewire and the bile duct was then deeply cannulated. Contrast was       injected. I personally interpreted the bile duct images. There was       appropriate flow of contrast through the ducts. The common bile duct       contained one stone, which was 6 mm in diameter. The common bile duct       was mildly dilated and diffusely dilated,  with a stone causing an       obstruction. The intrahepatic ducts and GB were filled and were grossly       unremarkable. The largest diameter of CBD dilation was 77mm. An 8 mm       biliary sphincterotomy was made with a traction (standard)       sphincterotome using ERBE electrocautery. There was no       post-sphincterotomy bleeding. The biliary tree was swept with a 12 mm       balloon starting at the bifurcation. One stone was removed. No stones       remained. A few CBD air bubbles were  noted that cleared with subsequent       balloon pull throughs and contrast injections. PD intentionally not       injected with contrast. Excellent biliary drainage at end of procedure. Impression:               - The common bile duct was mildly dilated, with a                            stone causing an obstruction.                           - Choledocholithiasis was found. Complete removal                            was accomplished by biliary sphincterotomy and                            balloon extraction.                           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 The signs and symptoms of potential                            delayed complications were discussed with the                            patient. Return to normal activities tomorrow.                            Written discharge instructions were provided to the                            patient.                           - Avoid aspirin and nonsteroidal anti-inflammatory                            medicines for 1 week.                           -  Clear liquid diet for 2 hours.                           - Then soft diet for rest of today and advance as                            tolerated to resume previous diet.                           - Return to GI office in 6 weeks. Procedure Code(s):        --- Professional ---                           [phone], Endoscopic retrograde                            cholangiopancreatography (ERCP); with removal of                            calculi/debris from biliary/pancreatic duct(s)                           43262, Endoscopic retrograde                            cholangiopancreatography (ERCP); with                            sphincterotomy/papillotomy Diagnosis Code(s):        --- Professional ---                           K80.51, Calculus of bile duct without cholangitis                             or cholecystitis with obstruction CPT copyright 2016 [employer]. All rights reserved. The codes documented in this report are preliminary and upon coder review may  be revised to meet current compliance requirements. Ladene Artist, MD 10/31/2016 10:09:31 AM This report has been signed electronically. Number of Addenda: 0

## 2016-11-01 ENCOUNTER — Encounter (HOSPITAL_COMMUNITY): Payer: Self-pay | Admitting: Gastroenterology

## 2016-11-05 NOTE — H&P (Signed)
History of Present Illness: This is a 71 year old female referred by Tower, Wynelle Fanny, MD for the evaluation of abnormal abdominal CT scan, diarrhea and abdominal pain. On July 4 she developed the onset of diarrhea and nausea vomiting lower abdominal discomfort and low-grade fevers. The symptoms intensified and worsened steadily until she had a syncopal episode and presented to the emergency department via EMS. CT scan and MRCP as below. AST=47 and other LFTs normal. An abnormal BMET with sodium of 134, an elevated BUN/creatinine, low CO2. She was place cipro and flagyl. She has had substantial improvement in her diarrhea now only several times a day from over 20 times per day. Her abdominal pain is improved she has minimal left lower quadrant pain. Nausea and vomiting and low-grade fevers have completely resolved and she is feeling quite well and tolerating a bland diet. Repeat CMP yesterday showed normal LFTs, potassium low at 3.3, albumin low at 3.4, otherwise unremarkable. Denies weight loss, constipation, change in stool caliber, melena, hematochezia, dysphagia, reflux symptoms, chest pain.  MRCP 10/03/2016 IMPRESSION: Mild biliary ductal dilatation, with 5 mm distal common bile duct calculus. Cholelithiasis.  No radiographic evidence of cholecystitis.  Abd/pelvic CT IMPRESSION: 1. Vague soft tissue inflammation about the cecum. Mild wall thickening along the splenic flexure of the colon, and along the descending and sigmoid colon, concerning for an acute infectious or inflammatory process. 2. Mild dilatation of the common bile duct to 1.0 cm in diameter, with a 4 mm stone noted at the distal common bile duct, raising concern for mild obstruction. Slight prominence of the intrahepatic biliary ducts. Cholelithiasis noted. Would correlate with LFTs, and consider MRCP or ERCP as deemed clinically appropriate. 3. Diffuse aortic atherosclerosis. 4. Calcified posterior disc protrusion at T11-T12, measuring 8  mm. This likely impresses on the underlying spinal cord. If the patient has associated symptoms, MRI of the thoracic spine could be considered for further evaluation.   Colonoscopy 10/2007: severe left colon diverticulosis  EGD 06/2002: normal        Allergies  Allergen Reactions  . Codeine Nausea And Vomiting         Outpatient Medications Prior to Visit  Medication Sig Dispense Refill  . acetaminophen (TYLENOL) 325 MG tablet Take 650 mg by mouth every 6 (six) hours as needed for mild pain, moderate pain, fever or headache.    Marland Kitchen aspirin EC 325 MG tablet Take 325 mg by mouth daily.    Marland Kitchen atorvastatin (LIPITOR) 10 MG tablet TAKE 0.5 TABLETS (5 MG TOTAL) BY MOUTH DAILY. (Patient taking differently: Take 5 mg by mouth at bedtime. ) 45 tablet 3  . cholecalciferol (VITAMIN D) 1000 UNITS tablet Take 1,000 Units by mouth 2 (two) times daily.     . ciprofloxacin (CIPRO) 500 MG tablet Take 1 tablet (500 mg total) by mouth 2 (two) times daily. 20 tablet 0  . esomeprazole (NEXIUM) 40 MG capsule TAKE 1 CAPSULE (40 MG TOTAL) BY MOUTH DAILY BEFORE BREAKFAST. (Patient taking differently: Take 40 mg by mouth daily before breakfast. ) 90 capsule 3  . fluticasone (FLONASE) 50 MCG/ACT nasal spray Place 1-2 sprays into both nostrils daily as needed for rhinitis.    Marland Kitchen lisinopril (PRINIVIL,ZESTRIL) 5 MG tablet Take 1 tablet (5 mg total) by mouth daily. 30 tablet 3  . metoprolol tartrate (LOPRESSOR) 25 MG tablet Take 0.5 tablets (12.5 mg total) by mouth 2 (two) times daily. 30 tablet 6  . metroNIDAZOLE (FLAGYL) 500 MG tablet Take 1 tablet (500  mg total) by mouth 2 (two) times daily. 14 tablet 0  . Multiple Vitamin (MULTIVITAMIN WITH MINERALS) TABS tablet Take 1 tablet by mouth daily.    Marland Kitchen omega-3 acid ethyl esters (LOVAZA) 1 g capsule Take 1 g by mouth 2 (two) times daily.     No facility-administered medications prior to visit.        Past Medical History:  Diagnosis Date  .  Adenomatous colon polyp 05/1985  . Aortic stenosis   . Diverticulosis   . GERD (gastroesophageal reflux disease)   . Headache    migraines prior to hysterectomy  . Hypertension   . PONV (postoperative nausea and vomiting)    nausea  . Shingles         Past Surgical History:  Procedure Laterality Date  . ABDOMINAL HYSTERECTOMY    . AORTIC VALVE REPLACEMENT N/A 08/16/2015   Procedure: AORTIC VALVE REPLACEMENT (AVR);  Surgeon: Ivin Poot, MD;  Location: [facility];  Service: Open Heart Surgery;  Laterality: N/A;  . APPENDECTOMY     taken out with hysterectomy  . BREAST BIOPSY Right 1990   EXCISIONAL - NEG  . CARDIAC CATHETERIZATION Bilateral 08/04/2015   Procedure: Right/Left Heart Cath and Coronary Angiography;  Surgeon: Wellington Hampshire, MD;  Location: [place] [facility];  Service: Cardiovascular;  Laterality: Bilateral;  . EYE SURGERY N/A    detatched retina  . KNEE SURGERY  10/2005   calcinosis - in office procedure  . TEE WITHOUT CARDIOVERSION N/A 08/16/2015   Procedure: TRANSESOPHAGEAL ECHOCARDIOGRAM (TEE);  Surgeon: Ivin Poot, MD;  Location: [place];  Service: Open Heart Surgery;  Laterality: N/A;   Social History        Social History  . Marital status: Married    Spouse name: N/A  . Number of children: 3  . Years of education: N/A       Occupational History  . Teacher         Social History Main Topics  . Smoking status: Former Smoker    Quit date: 07/07/2002  . Smokeless tobacco: Never Used  . Alcohol use No  . Drug use: No  . Sexual activity: No       Other Topics Concern  . None      Social History Narrative  . None        Family History  Problem Relation Age of Onset  . Cancer Mother        lung  . Stroke Mother   . Cancer Father        pancreatic  . Heart disease Sister   . Hypertension Brother   . Cancer Brother   . Diabetes Maternal Grandmother   . Hypertension Brother   . Obesity  Sister   . Lung cancer Brother   . Breast cancer Neg Hx       Review of Systems: Pertinent positive and negative review of systems were noted in the above HPI section. All other review of systems were otherwise negative.   Physical Exam: General: Well developed, well nourished, no acute distress Head: Normocephalic and bruising, abrasions, small lacerations around the bridge of nose and eyes Eyes:  sclerae anicteric, EOMI Ears: Normal auditory acuity Mouth: No deformity or lesions Neck: Supple, no masses or thyromegaly Lungs: Clear throughout to auscultation Heart: Regular rate and rhythm; no murmurs, rubs or bruits Abdomen: Soft, minimal LLQ tenderness and non distended. No masses, hepatosplenomegaly or hernias noted. Normal Bowel sounds Rectal: not done Musculoskeletal:  Symmetrical with no gross deformities  Skin: No lesions on visible extremities Pulses:  Normal pulses noted Extremities: No clubbing, cyanosis, edema or deformities noted Neurological: Alert oriented x 4, grossly nonfocal Cervical Nodes:  No significant cervical adenopathy Inguinal Nodes: No significant inguinal adenopathy Psychological:  Alert and cooperative. Normal mood and affect   Assessment and Recommendations:  1. Presumed infectious colitis leading to current symptoms and abnormal colon findings on CT scan.. Stool studies not obtained. Completed a course of Cipro and Flagyl as prescribed. Continue Bentyl 10 mg 3 times a day when necessary abdominal pain, cramping. Bland low-fat, lactose free diet for now and advance slowly as tolerated.    2. Choledocholithiasis on MRCP. Cholelithiasis, mild CBD dilation. AST was minimally elevated and now normal. All other LFTs are normal. Scheduled for ERCP.  he risks benefits and alternatives to ERCP with possible sphincterotomy, possible stent placement were discussed with the patient and she consents to proceed if necessary.  Cholecystectomy ultimately may  be required as well.

## 2016-11-13 ENCOUNTER — Ambulatory Visit: Payer: Medicare Other | Admitting: Gastroenterology

## 2016-11-17 ENCOUNTER — Other Ambulatory Visit: Payer: Self-pay | Admitting: Internal Medicine

## 2016-11-19 ENCOUNTER — Other Ambulatory Visit: Payer: Self-pay | Admitting: Gastroenterology

## 2016-11-22 ENCOUNTER — Ambulatory Visit
Admission: RE | Admit: 2016-11-22 | Discharge: 2016-11-22 | Disposition: A | Discharge status: Home / Self Care | Payer: Medicare Other | Source: Ambulatory Visit | Attending: Family Medicine | Admitting: Family Medicine

## 2016-11-22 DIAGNOSIS — M85832 Other specified disorders of bone density and structure, left forearm: Secondary | ICD-10-CM | POA: Insufficient documentation

## 2016-11-22 DIAGNOSIS — E2839 Other primary ovarian failure: Secondary | ICD-10-CM

## 2016-12-11 NOTE — Progress Notes (Signed)
Cardiology Office Note  Date:  12/13/2016   ID:  Heidi Middleton, DOB 05-08-1945, MRN [id number]  PCP:  Abner Greenspan, MD   Chief Complaint  Patient presents with  . other    6 month f/u no complaints today. Meds reviewed verbally with pt.    HPI:  Heidi Middleton is a 71 y.o. female with PMH of s/p AVR 08/16/2015 previous smoker Does exercise 4 days a week Ho presents for follow-up of her aortic valve stenosis, s/p AVR  Patient denies chest pain, PND, orthopnea, edema. No syncope Exercising several days per week does yoga, rubber bands Denies any chest pain or shortness of breath Overall with no complaints  Recent stone extraction gallbladder Prior to that had colitis for 21 days of diarrhea, treated with antibiotics July 2018  Lab work reviewed Potassium 3.3 in the setting of diarrhea Over the summer July 2018  Echocardiogram April 2018 reviewed with her in detail on today's visit  EKG personally reviewed by myself on todays visit Shows normal sinus rhythm rate 80 bpm no significant ST or T-wave changes  Other past medical history reviewed [facility] 08/04/2015:  Prox RCA to Mid RCA lesion, 30% stenosed.  1. Mild nonobstructive coronary artery disease. 2. Severe aortic valve stenosis with a mean gradient of 51.6 mmHg with a valve area of 0.67. 3. Right heart catheterization showed normal pulmonary pressure, normal cardiac output and mildly elevated pulmonary wedge pressure.  Echo 10/05/2015: Left ventricle: The cavity size was normal. Systolic function was normal. The estimated ejection fraction was in the range of 60% to 65%. Wall motion was normal; there were no regional wall motion abnormalities. Doppler parameters are consistent with abnormal left ventricular relaxation (grade 1 diastolic dysfunction). - Aortic valve: A bioprosthesis was present. Transvalvular velocity was within the normal range. There was no stenosis. Peak gradient (S): 26 mm  Hg.   Echo 06/28/2016 unchanged  PMH:   has a past medical history of Adenomatous colon polyp (05/1985); Aortic stenosis; Cholelithiasis; Diverticulosis; GERD (gastroesophageal reflux disease); Headache; Heart murmur; Hypertension; PONV (postoperative nausea and vomiting); and Shingles.  PSH:    Past Surgical History:  Procedure Laterality Date  . ABDOMINAL HYSTERECTOMY    . AORTIC VALVE REPLACEMENT N/A 08/16/2015   Procedure: AORTIC VALVE REPLACEMENT (AVR);  Surgeon: Ivin Poot, MD;  Location: [place];  Service: Open Heart Surgery;  Laterality: N/A;  . APPENDECTOMY     taken out with hysterectomy  . BREAST BIOPSY Right 1990   EXCISIONAL - NEG  . CARDIAC CATHETERIZATION Bilateral 08/04/2015   Procedure: Right/Left Heart Cath and Coronary Angiography;  Surgeon: Wellington Hampshire, MD;  Location: [place] [facility];  Service: Cardiovascular;  Laterality: Bilateral;  . ERCP N/A 10/31/2016   Procedure: ENDOSCOPIC RETROGRADE CHOLANGIOPANCREATOGRAPHY (ERCP);  Surgeon: Ladene Artist, MD;  Location: [facility] ENDOSCOPY;  Service: Endoscopy;  Laterality: N/A;  . EYE SURGERY N/A    detatched retina- "not sure which eye"  . KNEE SURGERY  10/2005   calcinosis - in office procedure  . TEE WITHOUT CARDIOVERSION N/A 08/16/2015   Procedure: TRANSESOPHAGEAL ECHOCARDIOGRAM (TEE);  Surgeon: Ivin Poot, MD;  Location: [place];  Service: Open Heart Surgery;  Laterality: N/A;    Current Outpatient Prescriptions  Medication Sig Dispense Refill  . acetaminophen (TYLENOL) 325 MG tablet Take 650 mg by mouth every 6 (six) hours as needed for mild pain, moderate pain, fever or headache.    Marland Kitchen aspirin EC 325 MG tablet Take 325  mg by mouth daily.    Marland Kitchen atorvastatin (LIPITOR) 10 MG tablet TAKE 0.5 TABLETS (5 MG TOTAL) BY MOUTH DAILY. (Patient taking differently: Take 5 mg by mouth at bedtime. ) 45 tablet 3  . cholecalciferol (VITAMIN D) 1000 UNITS tablet Take 1,000 Units by mouth 2 (two) times daily.     Marland Kitchen  esomeprazole (NEXIUM) 40 MG capsule TAKE 1 CAPSULE (40 MG TOTAL) BY MOUTH DAILY BEFORE BREAKFAST. (Patient taking differently: Take 40 mg by mouth daily before breakfast. ) 90 capsule 3  . fluticasone (FLONASE) 50 MCG/ACT nasal spray Place 1-2 sprays into both nostrils daily as needed for rhinitis.    Marland Kitchen lisinopril (PRINIVIL,ZESTRIL) 5 MG tablet TAKE 1 TABLET BY MOUTH EVERY DAY 30 tablet 0  . metoprolol tartrate (LOPRESSOR) 25 MG tablet Take 0.5 tablets (12.5 mg total) by mouth 2 (two) times daily. 30 tablet 6  . Multiple Vitamin (MULTIVITAMIN WITH MINERALS) TABS tablet Take 1 tablet by mouth daily.    Marland Kitchen omega-3 acid ethyl esters (LOVAZA) 1 g capsule Take 1 g by mouth 2 (two) times daily.     No current facility-administered medications for this visit.      Allergies:   Codeine   Social History:  The patient  reports that she quit smoking about 14 years ago. She quit after 25.00 years of use. She has never used smokeless tobacco. She reports that she does not drink alcohol or use drugs.   Family History:   family history includes Cancer in her brother, father, and mother; Diabetes in her maternal grandmother; Heart disease in her sister; Hypertension in her brother and brother; Lung cancer in her brother; Obesity in her sister; Stroke in her mother.    Review of Systems: Review of Systems  Constitutional: Negative.   Respiratory: Negative.   Cardiovascular: Negative.   Gastrointestinal: Negative.   Musculoskeletal: Negative.   Neurological: Negative.   Psychiatric/Behavioral: Negative.   All other systems reviewed and are negative.    PHYSICAL EXAM: VS:  BP 120/62 (BP Location: Left Arm, Patient Position: Sitting, Cuff Size: Normal)   Pulse 80   Ht 5\' 7"  (1.702 m)   Wt 161 lb 4 oz (73.1 kg)   BMI 25.26 kg/m  , BMI Body mass index is 25.26 kg/m.  GEN: Well nourished, well developed, in no acute distress  HEENT: normal  Neck: no JVD, carotid bruits, or masses Cardiac: RRR; no  murmurs, rubs, or gallops,no edema  Respiratory:  clear to auscultation bilaterally, normal work of breathing GI: soft, nontender, nondistended, + BS MS: no deformity or atrophy  Skin: warm and dry, no rash Neuro:  Strength and sensation are intact Psych: euthymic mood, full affect    Recent Labs: 06/19/2016: TSH 1.50 09/25/2016: ALT 29; BUN 11; Creatinine, Ser 0.98; Hemoglobin 12.4; Platelets 261.0; Potassium 3.3; Sodium 138    Lipid Panel Lab Results  Component Value Date   CHOL 181 06/19/2016   HDL 77.50 06/19/2016   LDLCALC 89 06/19/2016   TRIG 72.0 06/19/2016      Wt Readings from Last 3 Encounters:  12/13/16 161 lb 4 oz (73.1 kg)  10/31/16 160 lb (72.6 kg)  09/26/16 167 lb 6.4 oz (75.9 kg)       ASSESSMENT AND PLAN:  Nonrheumatic aortic valve stenosis - Plan: EKG 12-Lead Stone well after surgery, no further imaging needed  Essential hypertension Blood pressure is well controlled on today's visit. No changes made to the medications.  HYPERCHOLESTEROLEMIA Total cholesterol 180, active  Choledocholithiasis Stone extraction, recovered well Also recovered from her colitis  Need for immunization against influenza - Plan: Flu Vaccine QUAD 36+ mos IM Flu shot provided today after discussion  S/P AVR Echocardiogram reviewed, no further workup needed, doing well No significant murmur on exam  Disposition:   F/U  12 months   Orders Placed This Encounter  Procedures  . Flu Vaccine QUAD 36+ mos IM  . EKG 12-Lead     Signed, Esmond Plants, M.D., Ph.D. 12/13/2016  [place], [place]

## 2016-12-13 ENCOUNTER — Ambulatory Visit (INDEPENDENT_AMBULATORY_CARE_PROVIDER_SITE_OTHER): Payer: Medicare Other | Admitting: Cardiovascular Disease

## 2016-12-13 ENCOUNTER — Encounter: Payer: Self-pay | Admitting: Cardiovascular Disease

## 2016-12-13 VITALS — BP 120/62 | HR 80 | Ht 67.0 in | Wt 161.2 lb

## 2016-12-13 DIAGNOSIS — I10 Essential (primary) hypertension: Secondary | ICD-10-CM

## 2016-12-13 DIAGNOSIS — K8050 Calculus of bile duct without cholangitis or cholecystitis without obstruction: Secondary | ICD-10-CM | POA: Diagnosis not present

## 2016-12-13 DIAGNOSIS — Z952 Presence of prosthetic heart valve: Secondary | ICD-10-CM | POA: Diagnosis not present

## 2016-12-13 DIAGNOSIS — Z23 Encounter for immunization: Secondary | ICD-10-CM

## 2016-12-13 DIAGNOSIS — E7800 Pure hypercholesterolemia, unspecified: Secondary | ICD-10-CM

## 2016-12-13 DIAGNOSIS — I350 Nonrheumatic aortic (valve) stenosis: Secondary | ICD-10-CM | POA: Diagnosis not present

## 2016-12-13 NOTE — Patient Instructions (Signed)

## 2016-12-18 ENCOUNTER — Other Ambulatory Visit: Payer: Self-pay | Admitting: Cardiovascular Disease

## 2016-12-21 ENCOUNTER — Ambulatory Visit: Payer: Medicare Other

## 2016-12-24 ENCOUNTER — Encounter: Payer: Self-pay | Admitting: Gastroenterology

## 2016-12-24 ENCOUNTER — Ambulatory Visit (INDEPENDENT_AMBULATORY_CARE_PROVIDER_SITE_OTHER): Payer: Medicare Other | Admitting: Gastroenterology

## 2016-12-24 VITALS — BP 150/74 | HR 72 | Ht 67.0 in | Wt 162.0 lb

## 2016-12-24 DIAGNOSIS — Z1211 Encounter for screening for malignant neoplasm of colon: Secondary | ICD-10-CM

## 2016-12-24 DIAGNOSIS — K8050 Calculus of bile duct without cholangitis or cholecystitis without obstruction: Secondary | ICD-10-CM

## 2016-12-24 NOTE — Patient Instructions (Signed)
  Follow up with Dr Fuller Plan as needed.   We will contact you when your due for your colonoscopy 10/2017.   I appreciate the opportunity to care for you.

## 2016-12-24 NOTE — Progress Notes (Signed)
    History of Present Illness: This is a 71 year old female returning for follow-up after ERCP and bile duct stone extraction. She felt well immediately after ERCP and went back to all her usual activities. She has no GI complaints.  ERCP 10/2016: - The common bile duct was mildly dilated, with a stone causing an obstruction. - Choledocholithiasis was found. Complete removal was accomplished by biliary sphincterotomy and balloon extraction. 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  Physical Exam: General: Well developed, well nourished, no acute distress Head: Normocephalic and atraumatic Eyes:  sclerae anicteric, EOMI Ears: Normal auditory acuity Mouth: No deformity or lesions Lungs: Clear throughout to auscultation Heart: Regular rate and rhythm; no murmurs, rubs or bruits Abdomen: Soft, non tender and non distended. No masses, hepatosplenomegaly or hernias noted. Normal Bowel sounds Musculoskeletal: Symmetrical with no gross deformities  Pulses:  Normal pulses noted Extremities: No clubbing, cyanosis, edema or deformities noted Neurological: Alert oriented x 4, grossly nonfocal Psychological:  Alert and cooperative. Normal mood and affect  Assessment and Recommendations:  1. Choledocholithiasis, removed, resovled.   2.  CRC screening, average risk. Last colonoscopy performed in August 2009 by Dr. Verl Blalock. A 10 year interval screening colonoscopy was recommended by Dr. Sharlett Iles which will be due in August 2019.

## 2017-01-16 ENCOUNTER — Telehealth: Payer: Self-pay | Admitting: Cardiovascular Disease

## 2017-01-16 MED ORDER — LISINOPRIL 5 MG PO TABS: 5.0000 mg | ORAL_TABLET | Freq: Every day | ORAL | 3 refills | Status: DC

## 2017-01-16 NOTE — Telephone Encounter (Signed)
°*  STAT* If patient is at the pharmacy, call can be transferred to refill team.   1. Which medications need to be refilled? (please list name of each medication and dose if known)   Lisinopril 5 mg po q d   2. Which pharmacy/location (including street and city if local pharmacy) is medication to be sent to?  [facility] [address]   3. Do they need a 30 day or 90 day supply? [place]

## 2017-01-16 NOTE — Telephone Encounter (Signed)
Refill sent for 90 day supply for Lisinopril 5 mg

## 2017-05-06 ENCOUNTER — Other Ambulatory Visit: Payer: Self-pay | Admitting: Family Medicine

## 2017-05-06 DIAGNOSIS — Z1231 Encounter for screening mammogram for malignant neoplasm of breast: Secondary | ICD-10-CM

## 2017-06-02 IMAGING — MG MM DIGITAL SCREENING BILAT W/ TOMO W/ CAD
9 of 14 series · 9 of 30 positions shown · non-contrast
Comparison: Previous exam(s).

CLINICAL DATA: Screening. Prior benign excisional biopsy of the
right breast in 3443.

EXAM:
2D DIGITAL SCREENING BILATERAL MAMMOGRAM WITH CAD AND ADJUNCT TOMO

[R MLO (1 of 2)]
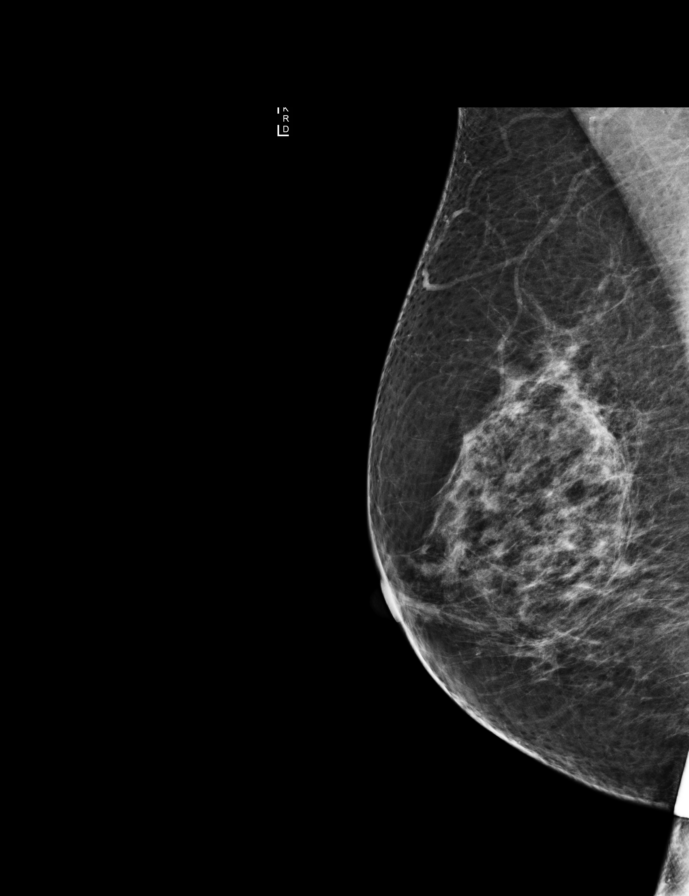

[R CC (1 of 2)]
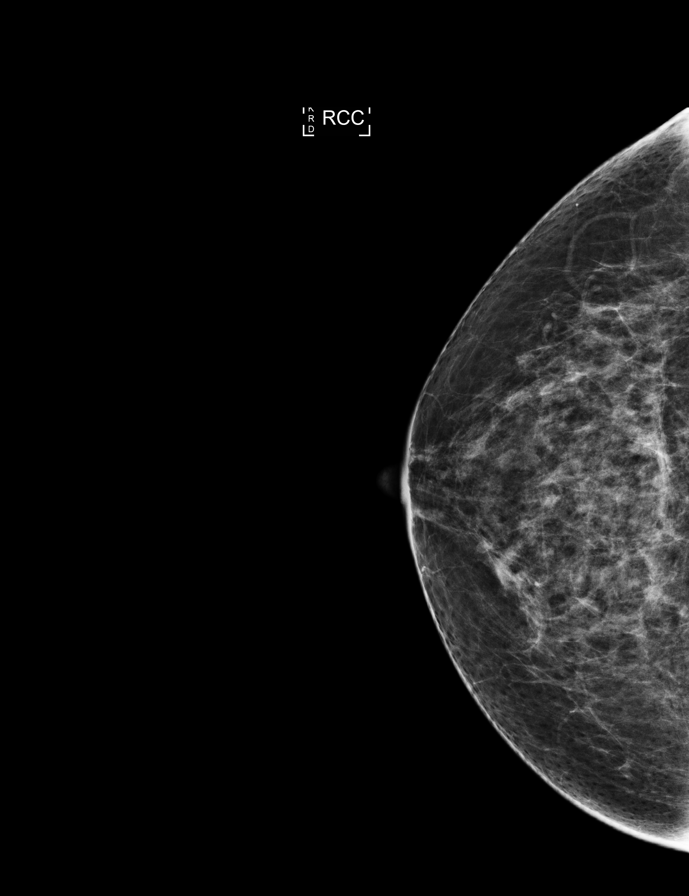

[R CC synth-2D]
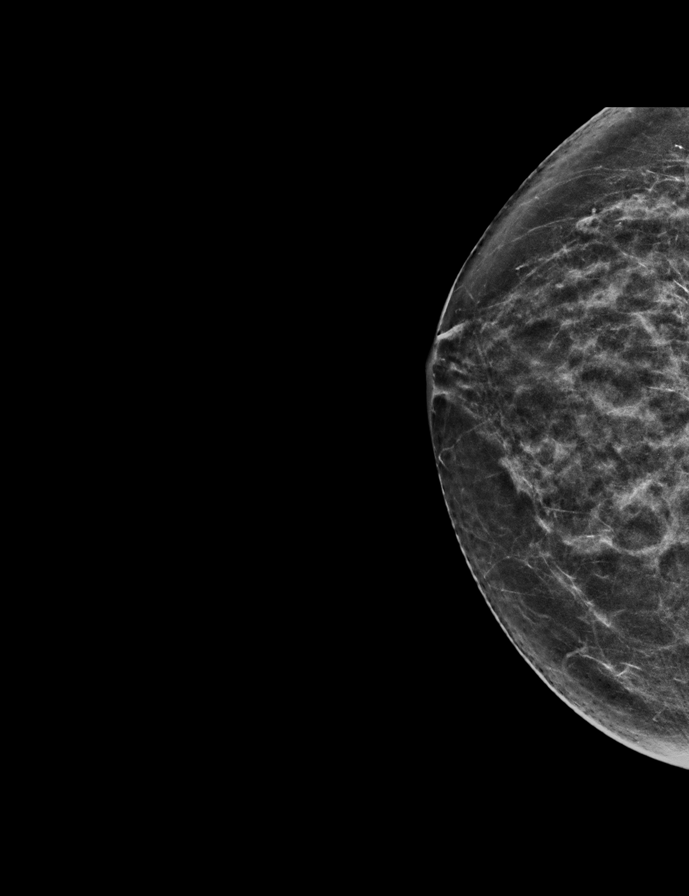

[R MLO synth-2D]
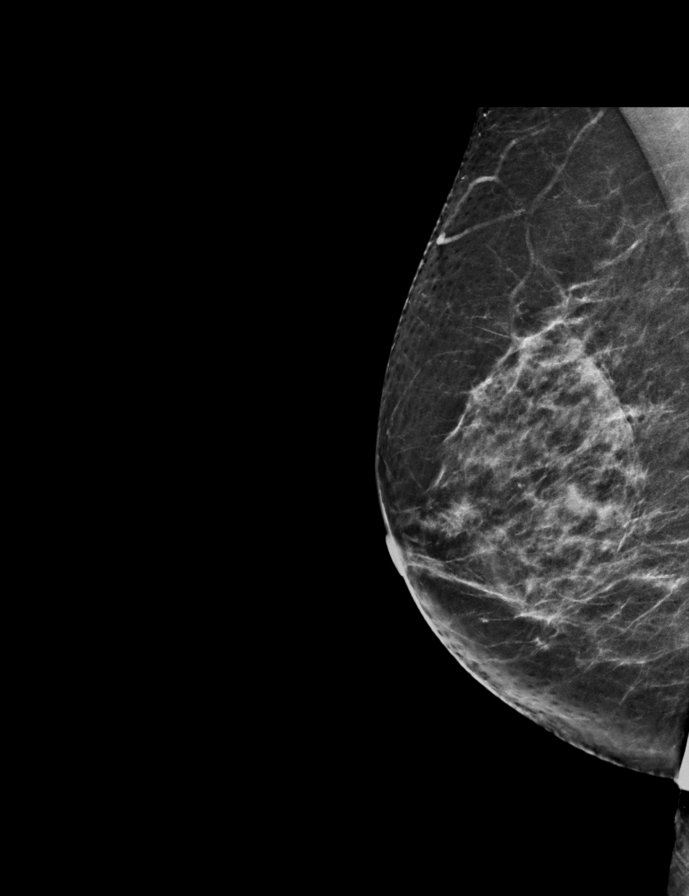

[L MLO]
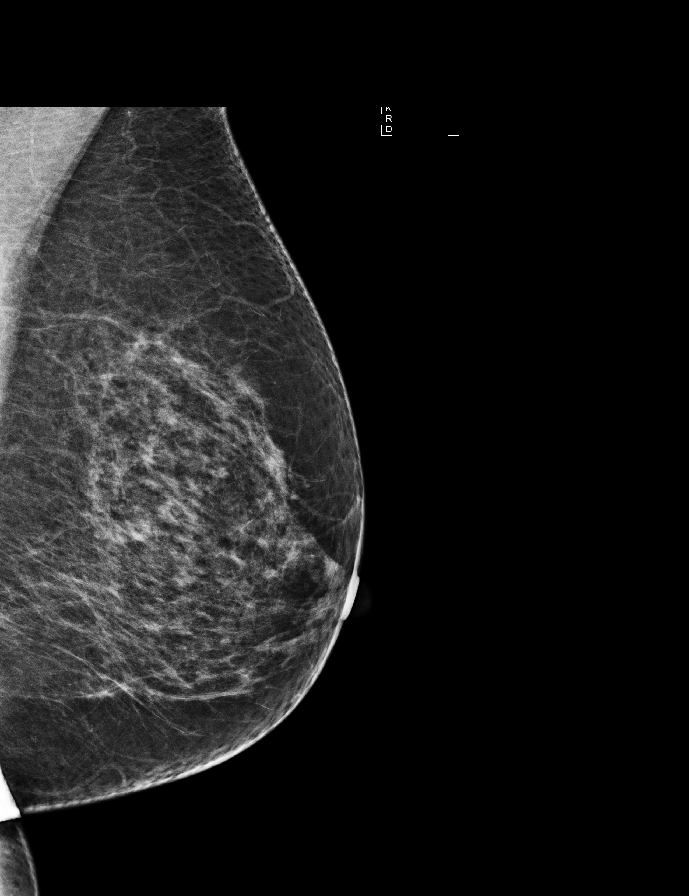

[L CC synth-2D]
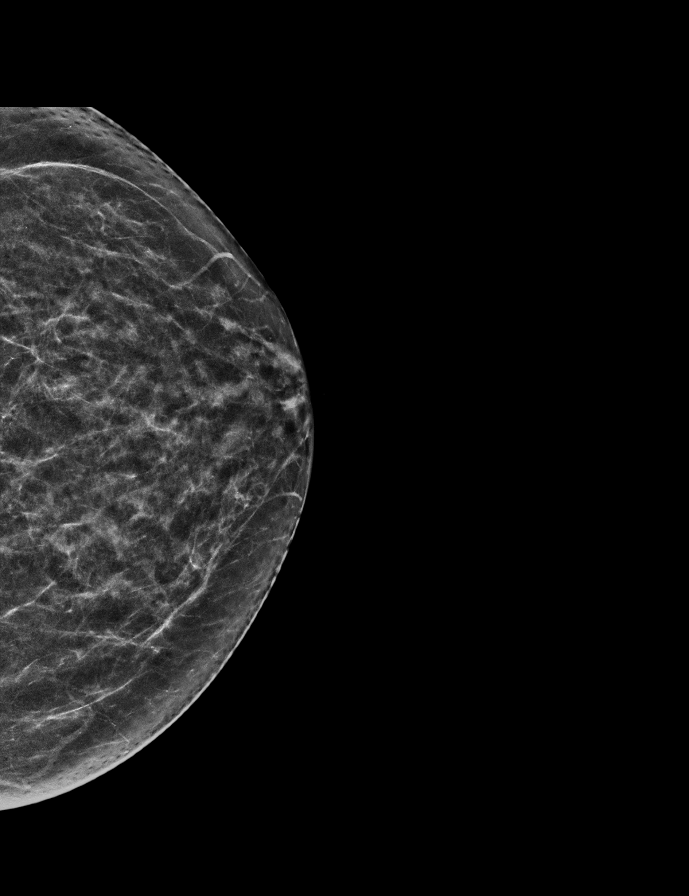

[L CC]
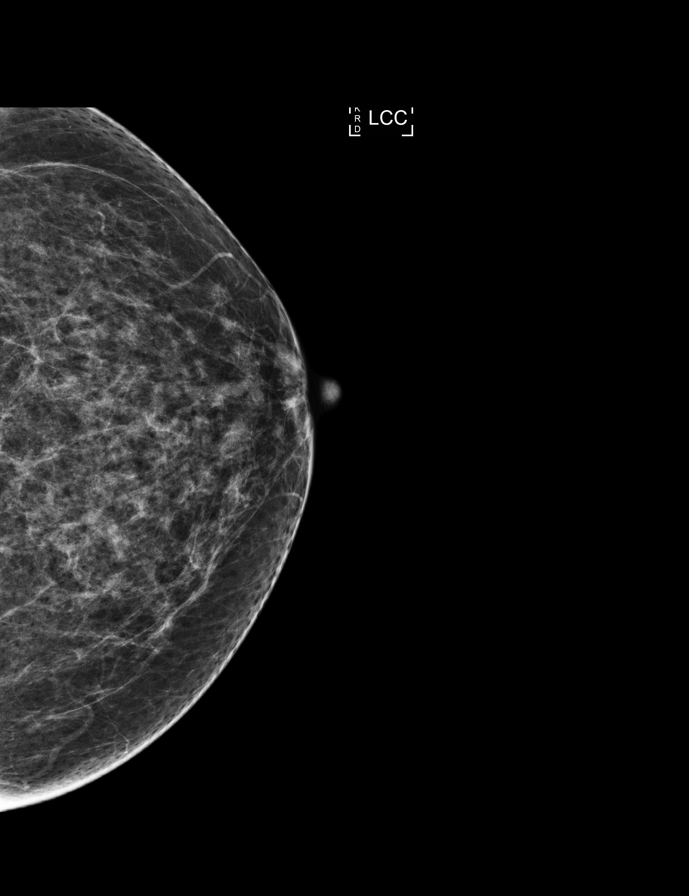

[R CC (2 of 2)]
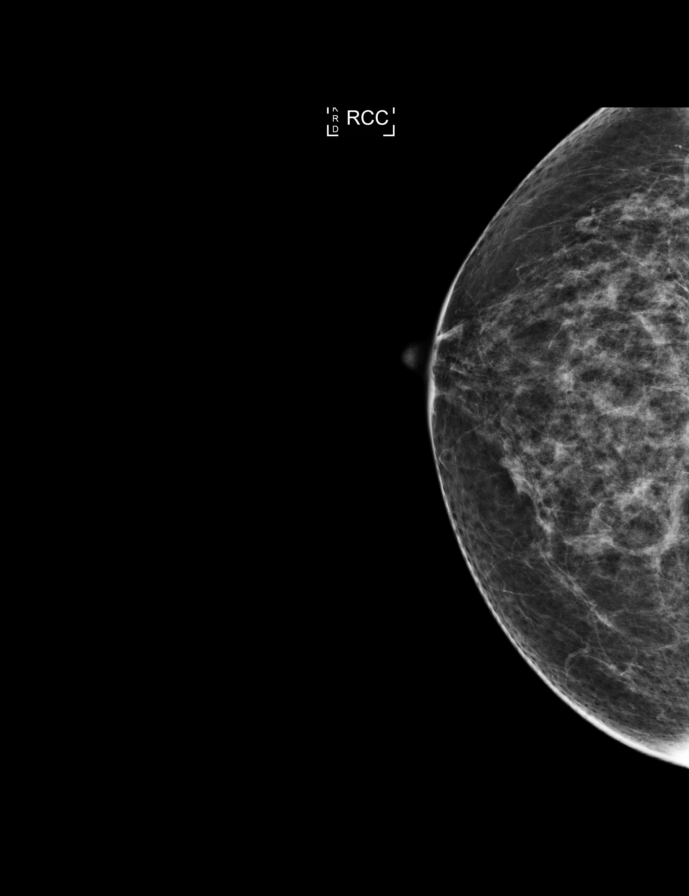

[R MLO (2 of 2)]
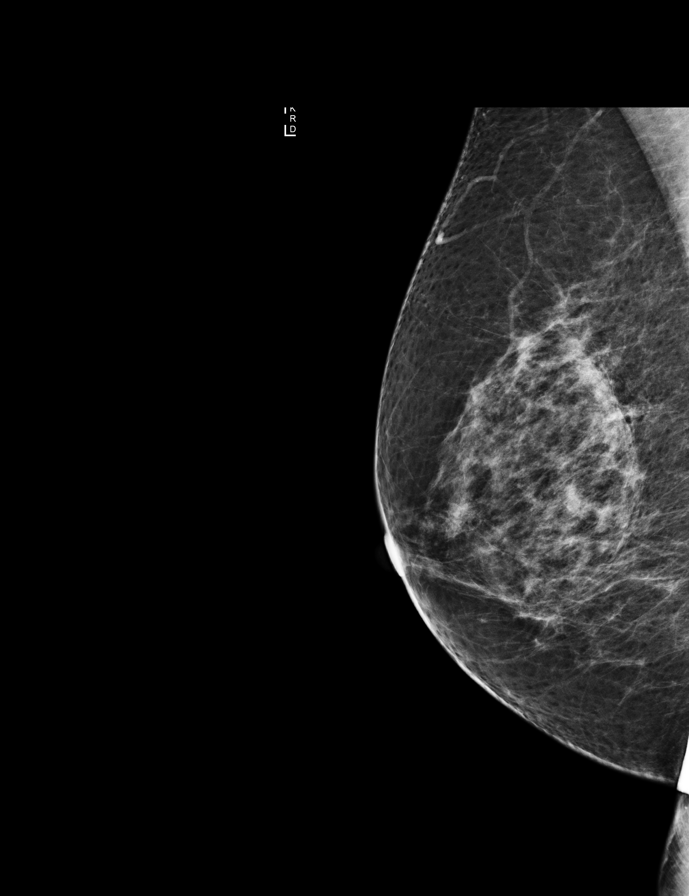

[9 of 30 positions shown; findings below may reference images not displayed]

ACR Breast Density Category c: The breast tissue is heterogeneously
dense, which may obscure small masses.
FINDINGS: There are no findings suspicious for malignancy. Images were
processed with CAD.
IMPRESSION: No mammographic evidence of malignancy. A result letter of this
screening mammogram will be mailed directly to the patient.

RECOMMENDATION:
Screening mammogram in one year. (Code:[id number])

BI-RADS CATEGORY  2: Benign.

## 2017-06-04 ENCOUNTER — Ambulatory Visit
Admission: RE | Admit: 2017-06-04 | Discharge: 2017-06-04 | Disposition: A | Discharge status: Home / Self Care | Payer: Medicare Other | Source: Ambulatory Visit | Attending: Family Medicine | Admitting: Family Medicine

## 2017-06-04 DIAGNOSIS — Z1231 Encounter for screening mammogram for malignant neoplasm of breast: Secondary | ICD-10-CM

## 2017-06-19 ENCOUNTER — Telehealth: Payer: Self-pay | Admitting: Family Medicine

## 2017-06-19 DIAGNOSIS — I10 Essential (primary) hypertension: Secondary | ICD-10-CM

## 2017-06-19 DIAGNOSIS — E7800 Pure hypercholesterolemia, unspecified: Secondary | ICD-10-CM

## 2017-06-19 DIAGNOSIS — R7309 Other abnormal glucose: Secondary | ICD-10-CM

## 2017-06-19 NOTE — Telephone Encounter (Signed)
-----   Message from Eustace Pen, LPN sent at 03/22/1028  8:51 AM EDT ----- Regarding: Labs 4/4 Lab orders needed. Thank you.  Insurance:  [facility] Medicare

## 2017-06-20 ENCOUNTER — Ambulatory Visit (INDEPENDENT_AMBULATORY_CARE_PROVIDER_SITE_OTHER): Payer: Medicare Other

## 2017-06-20 VITALS — BP 142/84 | HR 63 | Temp 98.2°F | Ht 67.25 in | Wt 163.2 lb

## 2017-06-20 DIAGNOSIS — R7309 Other abnormal glucose: Secondary | ICD-10-CM

## 2017-06-20 DIAGNOSIS — E7800 Pure hypercholesterolemia, unspecified: Secondary | ICD-10-CM | POA: Diagnosis not present

## 2017-06-20 DIAGNOSIS — Z0000 Encounter for general adult medical examination without abnormal findings: Secondary | ICD-10-CM | POA: Diagnosis not present

## 2017-06-20 DIAGNOSIS — I10 Essential (primary) hypertension: Secondary | ICD-10-CM | POA: Diagnosis not present

## 2017-06-20 LAB — CBC WITH DIFFERENTIAL/PLATELET
Basophils Absolute: 0.1 10*3/uL (ref 0.0–0.1)
Basophils Relative: 1.3 % (ref 0.0–3.0)
Eosinophils Absolute: 0.1 10*3/uL (ref 0.0–0.7)
Eosinophils Relative: 1.8 % (ref 0.0–5.0)
HCT: 38.5 % (ref 36.0–46.0)
Hemoglobin: 13.1 g/dL (ref 12.0–15.0)
Lymphocytes Relative: 31.8 % (ref 12.0–46.0)
Lymphs Abs: 1.4 10*3/uL (ref 0.7–4.0)
MCHC: 34 g/dL (ref 30.0–36.0)
MCV: 89.2 fl (ref 78.0–100.0)
Monocytes Absolute: 0.5 10*3/uL (ref 0.1–1.0)
Monocytes Relative: 10.8 % (ref 3.0–12.0)
Neutro Abs: 2.5 10*3/uL (ref 1.4–7.7)
Neutrophils Relative %: 54.3 % (ref 43.0–77.0)
Platelets: 249 10*3/uL (ref 150.0–400.0)
RBC: 4.31 Mil/uL (ref 3.87–5.11)
RDW: 13.5 % (ref 11.5–15.5)
WBC: 4.6 10*3/uL (ref 4.0–10.5)

## 2017-06-20 LAB — COMPREHENSIVE METABOLIC PANEL
ALT: 23 U/L (ref 0–35)
AST: 31 U/L (ref 0–37)
Albumin: 3.9 g/dL (ref 3.5–5.2)
Alkaline Phosphatase: 77 U/L (ref 39–117)
BUN: 14 mg/dL (ref 6–23)
CO2: 29 mEq/L (ref 19–32)
Calcium: 10.4 mg/dL (ref 8.4–10.5)
Chloride: 103 mEq/L (ref 96–112)
Creatinine, Ser: 1 mg/dL (ref 0.40–1.20)
GFR: 57.8 mL/min — ABNORMAL LOW (ref >60.00)
Glucose, Bld: 89 mg/dL (ref 70–99)
Potassium: 5.1 mEq/L (ref 3.5–5.1)
Sodium: 138 mEq/L (ref 135–145)
Total Bilirubin: 0.5 mg/dL (ref 0.2–1.2)
Total Protein: 7 g/dL (ref 6.0–8.3)

## 2017-06-20 LAB — LIPID PANEL
Cholesterol: 167 mg/dL (ref 0–200)
HDL: 74.3 mg/dL (ref >39.00)
LDL Cholesterol: 77 mg/dL (ref 0–99)
NonHDL: 93.11
Total CHOL/HDL Ratio: 2
Triglycerides: 80 mg/dL (ref 0.0–149.0)
VLDL: 16 mg/dL (ref 0.0–40.0)

## 2017-06-20 LAB — TSH: TSH: 1.64 u[IU]/mL (ref 0.35–4.50)

## 2017-06-20 LAB — HEMOGLOBIN A1C: Hgb A1c MFr Bld: 5.5 % (ref 4.6–6.5)

## 2017-06-20 NOTE — Progress Notes (Signed)
Subjective:   Heidi Middleton is a 72 y.o. female who presents for Medicare Annual (Subsequent) preventive examination.  Review of Systems:  N/A Cardiac Risk Factors include: advanced age (>73men, >87 women);dyslipidemia;hypertension     Objective:     Vitals: BP (!) 142/84 (BP Location: Left Arm, Patient Position: Sitting, Cuff Size: Normal)   Pulse 63   Temp 98.2 F (36.8 C) (Oral)   Ht 5' 7.25" (1.708 m) Comment: no shoes  Wt 163 lb 4 oz (74 kg)   SpO2 97%   BMI 25.38 kg/m   Body mass index is 25.38 kg/m.  Advanced Directives 06/20/2017 10/31/2016 09/23/2016 06/19/2016 09/12/2015 08/16/2015 08/11/2015  Does Patient Have a Medical Advance Directive? Yes No No Yes Yes Yes Yes  Type of Paramedic of [place];Living will Living will - [place];Living will [place];Living will [place];Living will [place];Living will  Does patient want to make changes to medical advance directive? - - - - No - Patient declined No - Patient declined No - Patient declined  Copy of Page in Chart? Yes - - Yes No - copy requested No - copy requested No - copy requested  Would patient like information on creating a medical advance directive? - No - Patient declined - - - No - patient declined information -    Tobacco Social History   Tobacco Use  Smoking Status Former Smoker  . Years: 25.00  . Last attempt to quit: 07/07/2002  . Years since quitting: 14.9  Smokeless Tobacco Never Used  Tobacco Comment   light smoker     Counseling given: No Comment: light smoker   Clinical Intake:  Pre-visit preparation completed: Yes  Pain : No/denies pain Pain Score: 2      Nutritional Status: BMI 25 -29 Overweight Nutritional Risks: None Diabetes: No  How often do you need to have someone help you when you read instructions, pamphlets, or other written materials from your  doctor or pharmacy?: 1 - Never What is the last grade level you completed in school?: Bachelors degree + additional college courses  Interpreter Needed?: No  Comments: pt lives with spouse Information entered by :: LPinson, LPN  Past Medical History:  Diagnosis Date  . Adenomatous colon polyp 05/1985  . Aortic stenosis   . Cholelithiasis   . Diverticulosis   . GERD (gastroesophageal reflux disease)   . Headache    migraines prior to hysterectomy  . Heart murmur   . Hypertension   . PONV (postoperative nausea and vomiting)    nausea  . Shingles    Past Surgical History:  Procedure Laterality Date  . ABDOMINAL HYSTERECTOMY    . AORTIC VALVE REPLACEMENT N/A 08/16/2015   Procedure: AORTIC VALVE REPLACEMENT (AVR);  Surgeon: Ivin Poot, MD;  Location: [place];  Service: Open Heart Surgery;  Laterality: N/A;  . APPENDECTOMY     taken out with hysterectomy  . BREAST BIOPSY Right 1990   EXCISIONAL - NEG  . CARDIAC CATHETERIZATION Bilateral 08/04/2015   Procedure: Right/Left Heart Cath and Coronary Angiography;  Surgeon: Wellington Hampshire, MD;  Location: [place] [facility];  Service: Cardiovascular;  Laterality: Bilateral;  . ERCP N/A 10/31/2016   Procedure: ENDOSCOPIC RETROGRADE CHOLANGIOPANCREATOGRAPHY (ERCP);  Surgeon: Ladene Artist, MD;  [facility] ENDOSCOPY;  Service: Endoscopy;  Laterality: N/A;  . EYE SURGERY N/A    detatched retina- "not sure which eye"  .  KNEE SURGERY  10/2005   calcinosis - in office procedure  . TEE WITHOUT CARDIOVERSION N/A 08/16/2015   Procedure: TRANSESOPHAGEAL ECHOCARDIOGRAM (TEE);  Surgeon: Ivin Poot, MD;  Location: [place];  Service: Open Heart Surgery;  Laterality: N/A;   Family History  Problem Relation Age of Onset  . Cancer Mother        lung  . Stroke Mother   . Cancer Father        pancreatic  . Heart disease Sister   . Hypertension Brother   . Cancer Brother   . Diabetes Maternal Grandmother   . Hypertension Brother   .  Obesity Sister   . Lung cancer Brother   . Breast cancer Neg Hx    Social History   Socioeconomic History  . Marital status: Married    Spouse name: Not on file  . Number of children: 3  . Years of education: Not on file  . Highest education level: Not on file  Occupational History  . Occupation: Pharmacist, hospital  Social Needs  . Financial resource strain: Not on file  . Food insecurity:    Worry: Not on file    Inability: Not on file  . Transportation needs:    Medical: Not on file    Non-medical: Not on file  Tobacco Use  . Smoking status: Former Smoker    Years: 25.00    Last attempt to quit: 07/07/2002    Years since quitting: 14.9  . Smokeless tobacco: Never Used  . Tobacco comment: light smoker  Substance and Sexual Activity  . Alcohol use: No    Alcohol/week: 0.0 oz  . Drug use: No  . Sexual activity: Never  Lifestyle  . Physical activity:    Days per week: Not on file    Minutes per session: Not on file  . Stress: Not on file  Relationships  . Social connections:    Talks on phone: Not on file    Gets together: Not on file    Attends religious service: Not on file    Active member of club or organization: Not on file    Attends meetings of clubs or organizations: Not on file    Relationship status: Not on file  Other Topics Concern  . Not on file  Social History Narrative  . Not on file    Outpatient Encounter Medications as of 06/20/2017  Medication Sig  . acetaminophen (TYLENOL) 325 MG tablet Take 650 mg by mouth every 6 (six) hours as needed for mild pain, moderate pain, fever or headache.  Marland Kitchen aspirin EC 325 MG tablet Take 325 mg by mouth daily.  Marland Kitchen atorvastatin (LIPITOR) 10 MG tablet TAKE 0.5 TABLETS (5 MG TOTAL) BY MOUTH DAILY.  . Calcium Carb-Cholecalciferol (CALCIUM 600 + D PO) Take 1 tablet by mouth daily.  . cholecalciferol (VITAMIN D) 1000 UNITS tablet Take 1,000 Units by mouth 2 (two) times daily.   Marland Kitchen esomeprazole (NEXIUM) 40 MG capsule TAKE 1 CAPSULE  (40 MG TOTAL) BY MOUTH DAILY BEFORE BREAKFAST.  . fluticasone (FLONASE) 50 MCG/ACT nasal spray Place 1-2 sprays into both nostrils daily as needed for rhinitis.  Marland Kitchen lisinopril (PRINIVIL,ZESTRIL) 5 MG tablet Take 1 tablet (5 mg total) by mouth daily.  . metoprolol tartrate (LOPRESSOR) 25 MG tablet Take 0.5 tablets (12.5 mg total) by mouth 2 (two) times daily.  . Multiple Vitamin (MULTIVITAMIN WITH MINERALS) TABS tablet Take 1 tablet by mouth daily.  Marland Kitchen omega-3 acid ethyl esters (LOVAZA) 1  g capsule Take 1 g by mouth 2 (two) times daily.   No facility-administered encounter medications on file as of 06/20/2017.     Activities of Daily Living In your present state of health, do you have any difficulty performing the following activities: 06/20/2017  Hearing? N  Vision? N  Difficulty concentrating or making decisions? N  Walking or climbing stairs? Y  Dressing or bathing? N  Doing errands, shopping? N  Preparing Food and eating ? N  Using the Toilet? N  In the past six months, have you accidently leaked urine? N  Do you have problems with loss of bowel control? N  Managing your Medications? N  Managing your Finances? N  Housekeeping or managing your Housekeeping? N  Some recent data might be hidden    Patient Care Team: Tower, Wynelle Fanny, MD as PCP - General Patty, A. Joneen Caraway, MD as Consulting Physician (Ophthalmology) Garrel Ridgel, DPM as Consulting Physician (Podiatry) Iris Pert, [place] as Referring Physician (Chiropractic Medicine)    Assessment:   This is a routine wellness examination for Trachelle.   Hearing Screening   125Hz  250Hz  500Hz  1000Hz  2000Hz  3000Hz  4000Hz  6000Hz  8000Hz   Right ear:   40 40 40  40    Left ear:   40 40 40  40    Vision Screening Comments: Last vision exam in March 2019 @ [facility] and Dietary recommendations Current Exercise Habits: Structured exercise class, Type of exercise: yoga;calisthenics;strength training/weights;stretching,  Frequency (Times/Week): 4, Intensity: Moderate, Exercise limited by: None identified  Goals    . Increase physical activity     Starting 06/20/2017, I will continue to exercise for 60 minutes 4 days a week.        Fall Risk Fall Risk  06/20/2017 06/19/2016 09/12/2015 06/20/2015 06/21/2014  Falls in the past year? No No No Yes No  Comment - - - doing yard work; taking care of dog -  Number falls in past yr: - - - 2 or more -  Injury with Fall? - - - Yes -  Risk Factor Category  - - - High Fall Risk -  Risk for fall due to : - - Medication side effect - -  Follow up - - - Falls evaluation completed;Education provided -   Depression Screen PHQ 2/9 Scores 06/20/2017 06/19/2016 11/02/2015 09/12/2015  PHQ - 2 Score 0 0 0 2  PHQ- 9 Score 0 - 1 14     Cognitive Function MMSE - Mini Mental State Exam 06/20/2017 06/19/2016 06/20/2015  Orientation to time 5 5 5   Orientation to Place 5 5 5   Registration 3 3 3   Attention/ Calculation 0 0 0  Recall 3 3 3   Language- name 2 objects 0 0 0  Language- repeat 1 1 1   Language- follow 3 step command 3 3 3   Language- read & follow direction 0 0 0  Write a sentence 0 0 0  Copy design 0 0 0  Total score 20 20 20      PLEASE NOTE: A Mini-Cog screen was completed. Maximum score is 20. A value of 0 denotes this part of [place] MMSE was not completed or the patient failed this part of the Mini-Cog screening.   Mini-Cog Screening Orientation to Time - Max 5 pts Orientation to Place - Max 5 pts Registration - Max 3 pts Recall - Max 3 pts Language Repeat - Max 1 pts Language Follow 3 Step Command - Max 3 pts  Immunization History  Administered Date(s) Administered  . Influenza Whole 01/23/2005, 01/18/2009, 01/31/2010  . Influenza,inj,Quad PF,6+ Mos 01/01/2013, 12/29/2013, 01/27/2015, 12/09/2015, 12/13/2016  . Influenza-Unspecified 01/04/2012  . Pneumococcal Conjugate-13 06/21/2014  . Pneumococcal Polysaccharide-23 03/09/2002, 01/18/2009, 06/24/2015  . Td  09/20/2004  . Tdap 06/21/2014  . Zoster 10/24/2006    Screening Tests Health Maintenance  Topic Date Due  . INFLUENZA VACCINE  10/17/2017  . COLONOSCOPY  10/27/2017  . MAMMOGRAM  06/05/2018  . TETANUS/TDAP  06/20/2024  . DEXA SCAN  Completed  . Hepatitis C Screening  Completed  . PNA vac Low Risk Adult  Completed      Plan:     I have personally reviewed, addressed, and noted the following in the patient's chart:  A. Medical and social history B. Use of alcohol, tobacco or illicit drugs  C. Current medications and supplements D. Functional ability and status E.  Nutritional status F.  Physical activity G. Advance directives H. List of other physicians I.  Hospitalizations, surgeries, and ER visits in previous 12 months J.  [place] to include hearing, vision, cognitive, depression L. Referrals and appointments - none  In addition, I have reviewed and discussed with patient certain preventive protocols, quality metrics, and best practice recommendations. A written personalized care plan for preventive services as well as general preventive health recommendations were provided to patient.  See attached scanned questionnaire for additional information.   Signed,   Lindell Noe, MHA, BS, LPN Health Coach

## 2017-06-20 NOTE — Patient Instructions (Signed)
Ms. Arvanitis , Thank you for taking time to come for your Medicare Wellness Visit. I appreciate your ongoing commitment to your health goals. Please review the following plan we discussed and let me know if I can assist you in the future.   These are the goals we discussed: Goals    . Increase physical activity     Starting 06/20/2017, I will continue to exercise for 60 minutes 4 days a week.        This is a list of the screening recommended for you and due dates:  Health Maintenance  Topic Date Due  . Flu Shot  10/17/2017  . Colon Cancer Screening  10/27/2017  . Mammogram  06/05/2018  . Tetanus Vaccine  06/20/2024  . DEXA scan (bone density measurement)  Completed  .  Hepatitis C: One time screening is recommended by Center for Disease Control  (CDC) for  adults born from [birth year] through 1966.   Completed  . Pneumonia vaccines  Completed   Preventive Care for Adults  A healthy lifestyle and preventive care can promote health and wellness. Preventive health guidelines for adults include the following key practices.  . A routine yearly physical is a good way to check with your health care provider about your health and preventive screening. It is a chance to share any concerns and updates on your health and to receive a thorough exam.  . Visit your dentist for a routine exam and preventive care every 6 months. Brush your teeth twice a day and floss once a day. Good oral hygiene prevents tooth decay and gum disease.  . The frequency of eye exams is based on your age, health, family medical history, use  of contact lenses, and other factors. Follow your health care provider's recommendations for frequency of eye exams.  . Eat a healthy diet. Foods like vegetables, fruits, whole grains, low-fat dairy products, and lean protein foods contain the nutrients you need without too many calories. Decrease your intake of foods high in solid fats, added sugars, and salt. Eat the right amount of  calories for you. Get information about a proper diet from your health care provider, if necessary.  . Regular physical exercise is one of the most important things you can do for your health. Most adults should get at least 150 minutes of moderate-intensity exercise (any activity that increases your heart rate and causes you to sweat) each week. In addition, most adults need muscle-strengthening exercises on 2 or more days a week.  Silver Sneakers may be a benefit available to you. To determine eligibility, you may visit the website: www.silversneakers.com or contact program at [phone])285-7703 Mon-Fri between 8AM-8PM.   . Maintain a healthy weight. The body mass index (BMI) is a screening tool to identify possible weight problems. It provides an estimate of body fat based on height and weight. Your health care provider can find your BMI and can help you achieve or maintain a healthy weight.   For adults 20 years and older: ? A BMI below 18.5 is considered underweight. ? A BMI of 18.5 to 24.9 is normal. ? A BMI of 25 to 29.9 is considered overweight. ? A BMI of 30 and above is considered obese.   . Maintain normal blood lipids and cholesterol levels by exercising and minimizing your intake of saturated fat. Eat a balanced diet with plenty of fruit and vegetables. Blood tests for lipids and cholesterol should begin at age 50 and be repeated every 5 years.  If your lipid or cholesterol levels are high, you are over 50, or you are at high risk for heart disease, you may need your cholesterol levels checked more frequently. Ongoing high lipid and cholesterol levels should be treated with medicines if diet and exercise are not working.  . If you smoke, find out from your health care provider how to quit. If you do not use tobacco, please do not start.  . If you choose to drink alcohol, please do not consume more than 2 drinks per day. One drink is considered to be 12 ounces (355 mL) of beer, 5 ounces  (148 mL) of wine, or 1.5 ounces (44 mL) of liquor.  . If you are 35-2 years old, ask your health care provider if you should take aspirin to prevent strokes.  . Use sunscreen. Apply sunscreen liberally and repeatedly throughout the day. You should seek shade when your shadow is shorter than you. Protect yourself by wearing long sleeves, pants, a wide-brimmed hat, and sunglasses year round, whenever you are outdoors.  . Once a month, do a whole body skin exam, using a mirror to look at the skin on your back. Tell your health care provider of new moles, moles that have irregular borders, moles that are larger than a pencil eraser, or moles that have changed in shape or color.

## 2017-06-20 NOTE — Progress Notes (Signed)
PCP notes:   Health maintenance:  No gaps identified.  Abnormal screenings:   None  Patient concerns:   None  Nurse concerns:  None  Next PCP appt:   06/28/17 @ 1430  I reviewed health advisor's note, was available for consultation, and agree with documentation and plan. Loura Pardon MD

## 2017-06-21 ENCOUNTER — Encounter: Payer: Medicare Other | Admitting: Family Medicine

## 2017-06-28 ENCOUNTER — Ambulatory Visit (INDEPENDENT_AMBULATORY_CARE_PROVIDER_SITE_OTHER): Payer: Medicare Other | Admitting: Family Medicine

## 2017-06-28 ENCOUNTER — Encounter: Payer: Self-pay | Admitting: Family Medicine

## 2017-06-28 VITALS — BP 130/65 | HR 71 | Temp 98.1°F | Ht 67.25 in | Wt 163.5 lb

## 2017-06-28 DIAGNOSIS — M8580 Other specified disorders of bone density and structure, unspecified site: Secondary | ICD-10-CM

## 2017-06-28 DIAGNOSIS — E7800 Pure hypercholesterolemia, unspecified: Secondary | ICD-10-CM

## 2017-06-28 DIAGNOSIS — I10 Essential (primary) hypertension: Secondary | ICD-10-CM | POA: Diagnosis not present

## 2017-06-28 DIAGNOSIS — Z0000 Encounter for general adult medical examination without abnormal findings: Secondary | ICD-10-CM | POA: Diagnosis not present

## 2017-06-28 MED ORDER — ATORVASTATIN CALCIUM 10 MG PO TABS: ORAL_TABLET | ORAL | 3 refills | Status: DC

## 2017-06-28 MED ORDER — ESOMEPRAZOLE MAGNESIUM 40 MG PO CPDR: DELAYED_RELEASE_CAPSULE | ORAL | 3 refills | Status: DC

## 2017-06-28 MED ORDER — ALPRAZOLAM 0.5 MG PO TABS: 0.5000 mg | ORAL_TABLET | Freq: Every day | ORAL | 0 refills | Status: DC | PRN

## 2017-06-28 NOTE — Progress Notes (Signed)
Subjective:    Patient ID: Heidi Middleton, female    DOB: 1945/07/27, 72 y.o.   MRN: [id number]  HPI  Here for health maintenance exam and to review chronic medical problems    Has been feeling ok  Had a lot going on this year   Had gallstone removed from cbd  Colitis - treated as well  That is better now - she had a really rough time   Going to [place] in June - grandson is playing [employer] Needs small px for xanax for air travel    Had amw 4/4 No gaps or concerns  Wt Readings from Last 3 Encounters:  06/28/17 163 lb 8 oz (74.2 kg)  06/20/17 163 lb 4 oz (74 kg)  12/24/16 162 lb (73.5 kg)  stable weight  She tries to choose a healthy diet for GI and cardiac symptoms  She is exercising - goes to yoga regularly  25.42 kg/m   Colonoscopy 8/09 no polyps  Dr Fuller Plan wants to do that in light of her GI issues    Mammogram 3/19 neg Self breast exam -no breast lumps   dexa 9/18 Osteopenia forearm-not severe No falls or fractures  Takes vitamin D , takes Ca   Takes melatonin for help with sleep occasionally (not often)   zostavax 8/08 May be interested in shigrix   bp is stable today  No cp or palpitations or headaches or edema  No side effects to medicines  BP Readings from Last 3 Encounters:  06/28/17 (!) 142/84  06/20/17 (!) 142/84  12/24/16 (!) 150/74   last cardiologist visit 120/62  BP: 130/65  Improved on 2nd check    On small dose of lisinopril and metoprolol  (cannot tolerate a higher lisinopril dose)  Metoprolol 12.5 bid  Pulse Readings from Last 3 Encounters:  06/28/17 71  06/20/17 63  12/24/16 72      Hyperlipidemia Lab Results  Component Value Date   CHOL 167 06/20/2017   CHOL 181 06/19/2016   CHOL 177 06/20/2015   Lab Results  Component Value Date   HDL 74.30 06/20/2017   HDL 77.50 06/19/2016   HDL 78.30 06/20/2015   Lab Results  Component Value Date   LDLCALC 77 06/20/2017   LDLCALC 89 06/19/2016   LDLCALC 84 06/20/2015    Lab Results  Component Value Date   TRIG 80.0 06/20/2017   TRIG 72.0 06/19/2016   TRIG 73.0 06/20/2015   Lab Results  Component Value Date   CHOLHDL 2 06/20/2017   CHOLHDL 2 06/19/2016   CHOLHDL 2 06/20/2015   No results found for: LDLDIRECT Atorvastatin and diet (highest dose tolerated)  Also lovaza  Less fatty foods   Other labs Results for orders placed or performed in visit on 06/20/17  Hemoglobin A1c  Result Value Ref Range   Hgb A1c MFr Bld 5.5 4.6 - 6.5 %  TSH  Result Value Ref Range   TSH 1.64 0.35 - 4.50 uIU/mL  Lipid panel  Result Value Ref Range   Cholesterol 167 0 - 200 mg/dL   Triglycerides 80.0 0.0 - 149.0 mg/dL   HDL 74.30 >39.00 mg/dL   VLDL 16.0 0.0 - 40.0 mg/dL   LDL Cholesterol 77 0 - 99 mg/dL   Total CHOL/HDL Ratio 2    NonHDL 93.11   Comprehensive metabolic panel  Result Value Ref Range   Sodium 138 135 - 145 mEq/L   Potassium 5.1 3.5 - 5.1 mEq/L   Chloride 103  96 - 112 mEq/L   CO2 29 19 - 32 mEq/L   Glucose, Bld 89 70 - 99 mg/dL   BUN 14 6 - 23 mg/dL   Creatinine, Ser 1.00 0.40 - 1.20 mg/dL   Total Bilirubin 0.5 0.2 - 1.2 mg/dL   Alkaline Phosphatase 77 39 - 117 U/L   AST 31 0 - 37 U/L   ALT 23 0 - 35 U/L   Total Protein 7.0 6.0 - 8.3 g/dL   Albumin 3.9 3.5 - 5.2 g/dL   Calcium 10.4 8.4 - 10.5 mg/dL   GFR 57.80 (L) >60.00 mL/min  CBC with Differential/Platelet  Result Value Ref Range   WBC 4.6 4.0 - 10.5 K/uL   RBC 4.31 3.87 - 5.11 Mil/uL   Hemoglobin 13.1 12.0 - 15.0 g/dL   HCT 38.5 36.0 - 46.0 %   MCV 89.2 78.0 - 100.0 fl   MCHC 34.0 30.0 - 36.0 g/dL   RDW 13.5 11.5 - 15.5 %   Platelets 249.0 150.0 - 400.0 K/uL   Neutrophils Relative % 54.3 43.0 - 77.0 %   Lymphocytes Relative 31.8 12.0 - 46.0 %   Monocytes Relative 10.8 3.0 - 12.0 %   Eosinophils Relative 1.8 0.0 - 5.0 %   Basophils Relative 1.3 0.0 - 3.0 %   Neutro Abs 2.5 1.4 - 7.7 K/uL   Lymphs Abs 1.4 0.7 - 4.0 K/uL   Monocytes Absolute 0.5 0.1 - 1.0 K/uL    Eosinophils Absolute 0.1 0.0 - 0.7 K/uL   Basophils Absolute 0.1 0.0 - 0.1 K/uL     Patient Active Problem List   Diagnosis Date Noted  . Elevated glucose level 06/19/2017  . Colitis 09/25/2016  . Estrogen deficiency 06/26/2016  . Aortic valve stenosis   . Routine general medical examination at a health care facility 06/24/2015  . Heart murmur, systolic 10/30/4816  . Need for hepatitis C screening test 06/20/2015  . Medicare annual wellness visit, subsequent 06/20/2015  . Encounter for Medicare annual wellness exam 06/13/2012  . Osteopenia 01/31/2010  . GERD 12/21/2009  . HYPERCHOLESTEROLEMIA 10/09/2006  . Quit smoking 10/09/2006  . GLAUCOMA 10/09/2006  . Essential hypertension 10/09/2006  . 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 OSTEOARTHRITIS 10/09/2006  . COLONIC POLYPS, HX OF 10/09/2006   Past Medical History:  Diagnosis Date  . Adenomatous colon polyp 05/1985  . Aortic stenosis   . Cholelithiasis   . Diverticulosis   . GERD (gastroesophageal reflux disease)   . Headache    migraines prior to hysterectomy  . Heart murmur   . Hypertension   . PONV (postoperative nausea and vomiting)    nausea  . Shingles    Past Surgical History:  Procedure Laterality Date  . ABDOMINAL HYSTERECTOMY    . AORTIC VALVE REPLACEMENT N/A 08/16/2015   Procedure: AORTIC VALVE REPLACEMENT (AVR);  Surgeon: Ivin Poot, MD;  Location: [facility];  Service: Open Heart Surgery;  Laterality: N/A;  . APPENDECTOMY     taken out with hysterectomy  . BREAST BIOPSY Right 1990   EXCISIONAL - NEG  . CARDIAC CATHETERIZATION Bilateral 08/04/2015   Procedure: Right/Left Heart Cath and Coronary Angiography;  Surgeon: Wellington Hampshire, MD;  Location: [facility];  Service: Cardiovascular;  Laterality: Bilateral;  . ERCP N/A 10/31/2016   Procedure: ENDOSCOPIC RETROGRADE CHOLANGIOPANCREATOGRAPHY (ERCP);  Surgeon: Ladene Artist, MD;  Location: [facility];  Service: Endoscopy;  Laterality: N/A;  .  EYE SURGERY N/A    detatched retina- "not  sure which eye"  . KNEE SURGERY  10/2005   calcinosis - in office procedure  . TEE WITHOUT CARDIOVERSION N/A 08/16/2015   Procedure: TRANSESOPHAGEAL ECHOCARDIOGRAM (TEE);  Surgeon: Ivin Poot, MD;  Location: [place];  Service: Open Heart Surgery;  Laterality: N/A;   Social History   Tobacco Use  . Smoking status: Former Smoker    Years: 25.00    Last attempt to quit: 07/07/2002    Years since quitting: 14.9  . Smokeless tobacco: Never Used  . Tobacco comment: light smoker  Substance Use Topics  . Alcohol use: No    Alcohol/week: 0.0 oz  . Drug use: No   Family History  Problem Relation Age of Onset  . Cancer Mother        lung  . Stroke Mother   . Cancer Father        pancreatic  . Heart disease Sister   . Hypertension Brother   . Cancer Brother   . Diabetes Maternal Grandmother   . Hypertension Brother   . Obesity Sister   . Lung cancer Brother   . Breast cancer Neg Hx    Allergies  Allergen Reactions  . Codeine Nausea And Vomiting   Current Outpatient Medications on File Prior to Visit  Medication Sig Dispense Refill  . acetaminophen (TYLENOL) 325 MG tablet Take 650 mg by mouth every 6 (six) hours as needed for mild pain, moderate pain, fever or headache.    Marland Kitchen aspirin EC 325 MG tablet Take 325 mg by mouth daily.    . Calcium Carb-Cholecalciferol (CALCIUM 600 + D PO) Take 1 tablet by mouth daily.    . cholecalciferol (VITAMIN D) 1000 UNITS tablet Take 1,000 Units by mouth 2 (two) times daily.     . fluticasone (FLONASE) 50 MCG/ACT nasal spray Place 1-2 sprays into both nostrils daily as needed for rhinitis.    Marland Kitchen lisinopril (PRINIVIL,ZESTRIL) 5 MG tablet Take 1 tablet (5 mg total) by mouth daily. 90 tablet 3  . metoprolol tartrate (LOPRESSOR) 25 MG tablet Take 0.5 tablets (12.5 mg total) by mouth 2 (two) times daily. 30 tablet 6  . Multiple Vitamin (MULTIVITAMIN WITH MINERALS) TABS tablet Take 1 tablet by mouth daily.    Marland Kitchen  omega-3 acid ethyl esters (LOVAZA) 1 g capsule Take 1 g by mouth 2 (two) times daily.     No current facility-administered medications on file prior to visit.     Review of Systems  Constitutional: Negative for activity change, appetite change, fatigue, fever and unexpected weight change.  HENT: Negative for congestion, ear pain, rhinorrhea, sinus pressure and sore throat.   Eyes: Negative for pain, redness and visual disturbance.  Respiratory: Negative for cough, shortness of breath and wheezing.   Cardiovascular: Negative for chest pain and palpitations.  Gastrointestinal: Negative for abdominal pain, blood in stool, constipation and diarrhea.  Endocrine: Negative for polydipsia and polyuria.  Genitourinary: Negative for dysuria, frequency and urgency.  Musculoskeletal: Negative for arthralgias, back pain and myalgias.  Skin: Negative for pallor and rash.  Allergic/Immunologic: Negative for environmental allergies.  Neurological: Negative for dizziness, syncope and headaches.  Hematological: Negative for adenopathy. Does not bruise/bleed easily.  Psychiatric/Behavioral: Negative for decreased concentration and dysphoric mood. The patient is not nervous/anxious.        Objective:   Physical Exam  Constitutional: She appears well-developed and well-nourished. No distress.  Well appearing   HENT:  Head: Normocephalic and atraumatic.  Right Ear: External ear normal.  Left  Ear: External ear normal.  Mouth/Throat: Oropharynx is clear and moist.  Eyes: Pupils are equal, round, and reactive to light. Conjunctivae and EOM are normal. No scleral icterus.  Neck: Normal range of motion. Neck supple. No JVD present. Carotid bruit is not present. No thyromegaly present.  Cardiovascular: Normal rate, regular rhythm, normal heart sounds and intact distal pulses. Exam reveals no gallop.  Pulmonary/Chest: Effort normal and breath sounds normal. No respiratory distress. She has no wheezes. She  exhibits no tenderness. No breast swelling, tenderness, discharge or bleeding.  bs are distant but clear Good air exch   Abdominal: Soft. Bowel sounds are normal. She exhibits no distension, no abdominal bruit and no mass. There is no tenderness.  Genitourinary: No breast swelling, tenderness, discharge or bleeding.  Genitourinary Comments: Breast exam: No mass, nodules, thickening, tenderness, bulging, retraction, inflamation, nipple discharge or skin changes noted.  No axillary or clavicular LA.      Musculoskeletal: Normal range of motion. She exhibits no edema or tenderness.  No kyphosis    Lymphadenopathy:    She has no cervical adenopathy.  Neurological: She is alert. She has normal reflexes. No cranial nerve deficit. She exhibits normal muscle tone. Coordination normal.  Skin: Skin is warm and dry. No rash noted. No erythema. No pallor.  Solar lentigines diffusely   Psychiatric: She has a normal mood and affect.          Assessment & Plan:   Problem List Items Addressed This Visit      Cardiovascular and Mediastinum   Essential hypertension    bp in fair control at this time  BP Readings from Last 1 Encounters:  06/28/17 130/65   No changes needed Disc lifstyle change with low sodium diet and exercise  Labs reviewed       Relevant Medications   atorvastatin (LIPITOR) 10 MG tablet     Musculoskeletal and Integument   Osteopenia    Rev dexa 9/18 Mild in forearm No falls or fractures  Disc need for calcium/ vitamin D/ wt bearing exercise and bone density test every 2 y to monitor Disc safety/ fracture risk in detail          Other   HYPERCHOLESTEROLEMIA    Disc goals for lipids and reasons to control them Rev labs with pt Rev low sat fat diet in detail  Well controlled with diet and atorvastatin and lovaza Diet is improved recently       Relevant Medications   atorvastatin (LIPITOR) 10 MG tablet   Routine general medical examination at a health care  facility - Primary    Reviewed health habits including diet and exercise and skin cancer prevention Reviewed appropriate screening tests for age  Also reviewed health mt list, fam hx and immunization status , as well as social and family history   See HPI  amw rev Labs rev  Colonoscopy due in august Disc getting the shingrix vaccine  Enc to continue good exercise/yoga

## 2017-06-28 NOTE — Patient Instructions (Addendum)
Don't forget about colonoscopy due in august   If you are interested in the new shingles vaccine (Shingrix) - call your local pharmacy to check on coverage and availability  If interested -get on a waiting list at your pharmacy      Take care of yourself  Keep up the good work  Keep doing yoga

## 2017-06-29 NOTE — Assessment & Plan Note (Addendum)
Disc goals for lipids and reasons to control them Rev labs with pt Rev low sat fat diet in detail  Well controlled with diet and atorvastatin and lovaza Diet is improved recently

## 2017-06-29 NOTE — Assessment & Plan Note (Signed)
Reviewed health habits including diet and exercise and skin cancer prevention Reviewed appropriate screening tests for age  Also reviewed health mt list, fam hx and immunization status , as well as social and family history   See HPI  amw rev Labs rev  Colonoscopy due in august Disc getting the shingrix vaccine  Enc to continue good exercise/yoga

## 2017-06-29 NOTE — Assessment & Plan Note (Signed)
bp in fair control at this time  BP Readings from Last 1 Encounters:  06/28/17 130/65   No changes needed Disc lifstyle change with low sodium diet and exercise  Labs reviewed

## 2017-06-29 NOTE — Assessment & Plan Note (Signed)
Rev dexa 9/18 Mild in forearm No falls or fractures  Disc need for calcium/ vitamin D/ wt bearing exercise and bone density test every 2 y to monitor Disc safety/ fracture risk in detail

## 2017-07-01 ENCOUNTER — Ambulatory Visit (INDEPENDENT_AMBULATORY_CARE_PROVIDER_SITE_OTHER): Payer: Medicare Other | Admitting: Podiatry

## 2017-07-01 ENCOUNTER — Encounter: Payer: Self-pay | Admitting: Podiatry

## 2017-07-01 ENCOUNTER — Ambulatory Visit (INDEPENDENT_AMBULATORY_CARE_PROVIDER_SITE_OTHER): Payer: Medicare Other

## 2017-07-01 DIAGNOSIS — M722 Plantar fascial fibromatosis: Secondary | ICD-10-CM

## 2017-07-01 DIAGNOSIS — M2041 Other hammer toe(s) (acquired), right foot: Secondary | ICD-10-CM | POA: Diagnosis not present

## 2017-07-01 NOTE — Progress Notes (Signed)
Subjective:  Patient ID: Heidi Middleton, female    DOB: January 08, 1946,  MRN: [id number] HPI Chief Complaint  Patient presents with  . Foot Pain    Patient presents today for plantar fibroma right foot gradually getting bigger x 2-70months.  She states as it gets bigger, it gets more uncomfortable and request injection.  She has not done anything for treatment    72 y.o. female presents with the above complaint.     Past Medical History:  Diagnosis Date  . Adenomatous colon polyp 05/1985  . Aortic stenosis   . Cholelithiasis   . Diverticulosis   . GERD (gastroesophageal reflux disease)   . Headache    migraines prior to hysterectomy  . Heart murmur   . Hypertension   . PONV (postoperative nausea and vomiting)    nausea  . Shingles    Past Surgical History:  Procedure Laterality Date  . ABDOMINAL HYSTERECTOMY    . AORTIC VALVE REPLACEMENT N/A 08/16/2015   Procedure: AORTIC VALVE REPLACEMENT (AVR);  Surgeon: Ivin Poot, MD;  Location: [place];  Service: Open Heart Surgery;  Laterality: N/A;  . APPENDECTOMY     taken out with hysterectomy  . BREAST BIOPSY Right 1990   EXCISIONAL - NEG  . CARDIAC CATHETERIZATION Bilateral 08/04/2015   Procedure: Right/Left Heart Cath and Coronary Angiography;  Surgeon: Wellington Hampshire, MD;  Location: [facility];  Service: Cardiovascular;  Laterality: Bilateral;  . ERCP N/A 10/31/2016   Procedure: ENDOSCOPIC RETROGRADE CHOLANGIOPANCREATOGRAPHY (ERCP);  Surgeon: Ladene Artist, MD;  Location: [facility];  Service: Endoscopy;  Laterality: N/A;  . EYE SURGERY N/A    detatched retina- "not sure which eye"  . KNEE SURGERY  10/2005   calcinosis - in office procedure  . TEE WITHOUT CARDIOVERSION N/A 08/16/2015   Procedure: TRANSESOPHAGEAL ECHOCARDIOGRAM (TEE);  Surgeon: Ivin Poot, MD;  Location: [facility];  Service: Open Heart Surgery;  Laterality: N/A;    Current Outpatient Medications:  .  acetaminophen (TYLENOL) 325 MG tablet,  Take 650 mg by mouth every 6 (six) hours as needed for mild pain, moderate pain, fever or headache., Disp: , Rfl:  .  ALPRAZolam (XANAX) 0.5 MG tablet, Take 1 tablet (0.5 mg total) by mouth daily as needed for anxiety (airplane travel)., Disp: 10 tablet, Rfl: 0 .  aspirin EC 325 MG tablet, Take 325 mg by mouth daily., Disp: , Rfl:  .  atorvastatin (LIPITOR) 10 MG tablet, TAKE 0.5 TABLETS (5 MG TOTAL) BY MOUTH DAILY., Disp: 45 tablet, Rfl: 3 .  Calcium Carb-Cholecalciferol (CALCIUM 600 + D PO), Take 1 tablet by mouth daily., Disp: , Rfl:  .  cholecalciferol (VITAMIN D) 1000 UNITS tablet, Take 1,000 Units by mouth 2 (two) times daily. , Disp: , Rfl:  .  esomeprazole (NEXIUM) 40 MG capsule, TAKE 1 CAPSULE (40 MG TOTAL) BY MOUTH DAILY BEFORE BREAKFAST., Disp: 90 capsule, Rfl: 3 .  fluticasone (FLONASE) 50 MCG/ACT nasal spray, Place 1-2 sprays into both nostrils daily as needed for rhinitis., Disp: , Rfl:  .  lisinopril (PRINIVIL,ZESTRIL) 5 MG tablet, Take 1 tablet (5 mg total) by mouth daily., Disp: 90 tablet, Rfl: 3 .  metoprolol tartrate (LOPRESSOR) 25 MG tablet, Take 0.5 tablets (12.5 mg total) by mouth 2 (two) times daily., Disp: 30 tablet, Rfl: 6 .  Multiple Vitamin (MULTIVITAMIN WITH MINERALS) TABS tablet, Take 1 tablet by mouth daily., Disp: , Rfl:  .  omega-3 acid ethyl esters (LOVAZA) 1 g capsule, Take  1 g by mouth 2 (two) times daily., Disp: , Rfl:   Allergies  Allergen Reactions  . Codeine Nausea And Vomiting   Review of Systems Objective:  There were no vitals filed for this visit.  General: Well developed, nourished, in no acute distress, alert and oriented x3   Dermatological: Skin is warm, dry and supple bilateral. Nails x 10 are well maintained; remaining integument appears unremarkable at this time. There are no open sores, no preulcerative lesions, no rash or signs of infection present.  Vascular: Dorsalis Pedis artery and Posterior Tibial artery pedal pulses are 2/4 bilateral  with immedate capillary fill time. Pedal hair growth present. No varicosities and no lower extremity edema present bilateral.   Neruologic: Grossly intact via light touch bilateral. Vibratory intact via tuning fork bilateral. Protective threshold with Semmes Wienstein monofilament intact to all pedal sites bilateral. Patellar and Achilles deep tendon reflexes 2+ bilateral. No Babinski or clonus noted bilateral.   Musculoskeletal: No gross boney pedal deformities bilateral. No pain, crepitus, or limitation noted with foot and ankle range of motion bilateral. Muscular strength 5/5 in all groups tested bilateral.  Soft tissue lesion medial band of the plantar fascia centrally located measures greater than the size of a quarter.  It is flat is not modulated.  It is not pulsatile.  Gait: Unassisted, Nonantalgic.    Radiographs:  No acute findings soft tissue swelling along the medial band of plantar fascia.  No fractures identified.  F2  Assessment & Plan:   Assessment: Plantar fibroma right foot.  Plan: After sterile Betadine skin prep I injected 20 mg Kenalog 5 mg Marcaine to the point of maximal tenderness into the fibroma multiple times.  I will follow-up with her on an as-needed basis.     Heidi Middleton, [place]

## 2017-09-09 ENCOUNTER — Encounter: Payer: Self-pay | Admitting: Gastroenterology

## 2017-09-30 ENCOUNTER — Encounter: Payer: Self-pay | Admitting: Gastroenterology

## 2017-11-26 ENCOUNTER — Ambulatory Visit (AMBULATORY_SURGERY_CENTER): Payer: Self-pay

## 2017-11-26 VITALS — Ht 67.5 in | Wt 166.0 lb

## 2017-11-26 DIAGNOSIS — Z86010 Personal history of colonic polyps: Secondary | ICD-10-CM

## 2017-11-26 MED ORDER — NA SULFATE-K SULFATE-MG SULF 17.5-3.13-1.6 GM/177ML PO SOLN: 1.0000 | Freq: Once | ORAL | 0 refills | Status: AC

## 2017-11-26 NOTE — Progress Notes (Signed)
Denies allergies to eggs or soy products. Denies complication of anesthesia or sedation. Denies use of weight loss medication. Denies use of O2.   Emmi instructions declined.  

## 2017-12-08 NOTE — Progress Notes (Signed)
[facility] Office Note  Date:  12/10/2017   ID:  Heidi Middleton, DOB [birth year], MRN [id number]  PCP:  Abner Greenspan, MD   Chief Complaint  Patient presents with  . OTHER    12 month f/u sob. Meds reviewed verbally with pt.    HPI:  Heidi Middleton is a 72 y.o. female with PMH of s/p AVR 08/16/2015 previous smoker Does exercise 4 days a week Ho presents for follow-up of her aortic valve stenosis, s/p AVR  Patient denies chest pain, PND, orthopnea, edema. No syncope Exercising several days per week   Denies any chest pain or shortness of breath Overall with no complaints  Last echocardiogram April 2018 normal ejection fraction Normal-appearing prosthetic valve  Previous stone extraction gallbladder Prior to that had colitis for 21 days of diarrhea, treated with antibiotics July 2018  Lab work reviewed Potassium 3.3 in the setting of diarrhea Over the summer July 2018 Potassium has improved up to 5.1 She is not on potassium supplement  Echocardiogram April 2018 reviewed with her in detail Denies any symptomatic changes since that time  EKG personally reviewed by myself on todays visit Shows normal sinus rhythm rate 88bpm no significant ST or T-wave changes with PVCs  Other past medical history reviewed [facility] 08/04/2015:  Prox RCA to Mid RCA lesion, 30% stenosed.  1. Mild nonobstructive coronary artery disease. 2. Severe aortic valve stenosis with a mean gradient of 51.6 mmHg with a valve area of 0.67. 3. Right heart catheterization showed normal pulmonary pressure, normal cardiac output and mildly elevated pulmonary wedge pressure.  Echo 10/05/2015: Left ventricle: The cavity size was normal. Systolic function was normal. The estimated ejection fraction was in the range of 60% to 65%. Wall motion was normal; there were no regional wall motion abnormalities. Doppler parameters are consistent with abnormal left ventricular relaxation (grade 1  diastolic dysfunction). - Aortic valve: A bioprosthesis was present. Transvalvular velocity was within the normal range. There was no stenosis. Peak gradient (S): 26 mm Hg.   Echo 06/28/2016 unchanged  PMH:   has a past medical history of Adenomatous colon polyp (05/1985), Allergy, Aortic stenosis, Cataract, Cholelithiasis, Diverticulosis, GERD (gastroesophageal reflux disease), Headache, Heart murmur, Hyperlipidemia, Hypertension, Osteopenia, PONV (postoperative nausea and vomiting), and Shingles.  PSH:    Past Surgical History:  Procedure Laterality Date  . ABDOMINAL HYSTERECTOMY    . AORTIC VALVE REPLACEMENT N/A 08/16/2015   Procedure: AORTIC VALVE REPLACEMENT (AVR);  Surgeon: Ivin Poot, MD;  Location: [facility];  Service: Open Heart Surgery;  Laterality: N/A;  . APPENDECTOMY     taken out with hysterectomy  . BREAST BIOPSY Right 1990   EXCISIONAL - NEG  . CARDIAC CATHETERIZATION Bilateral 08/04/2015   Procedure: Right/Left Heart Cath and Coronary Angiography;  Surgeon: Wellington Hampshire, MD;  Location: [place] [facility];  Service: Cardiovascular;  Laterality: Bilateral;  . ERCP N/A 10/31/2016   Procedure: ENDOSCOPIC RETROGRADE CHOLANGIOPANCREATOGRAPHY (ERCP);  Surgeon: Ladene Artist, MD;  Location: [facility];  Service: Endoscopy;  Laterality: N/A;  . EYE SURGERY N/A    detatched retina- "not sure which eye"  . KNEE SURGERY  10/2005   calcinosis - in office procedure  . TEE WITHOUT CARDIOVERSION N/A 08/16/2015   Procedure: TRANSESOPHAGEAL ECHOCARDIOGRAM (TEE);  Surgeon: Ivin Poot, MD;  Location: [place];  Service: Open Heart Surgery;  Laterality: N/A;    Current Outpatient Medications  Medication Sig Dispense Refill  . acetaminophen (TYLENOL) 325 MG tablet Take  650 mg by mouth every 6 (six) hours as needed for mild pain, moderate pain, fever or headache.    . ALPRAZolam (XANAX) 0.5 MG tablet Take 1 tablet (0.5 mg total) by mouth daily as needed for anxiety  (airplane travel). 10 tablet 0  . aspirin EC 325 MG tablet Take 325 mg by mouth daily.    Marland Kitchen atorvastatin (LIPITOR) 10 MG tablet TAKE 0.5 TABLETS (5 MG TOTAL) BY MOUTH DAILY. 45 tablet 3  . Calcium Carb-Cholecalciferol (CALCIUM 600 + D PO) Take 1 tablet by mouth daily.    . cholecalciferol (VITAMIN D) 1000 UNITS tablet Take 1,000 Units by mouth 2 (two) times daily.     Marland Kitchen esomeprazole (NEXIUM) 40 MG capsule TAKE 1 CAPSULE (40 MG TOTAL) BY MOUTH DAILY BEFORE BREAKFAST. 90 capsule 3  . fluticasone (FLONASE) 50 MCG/ACT nasal spray Place 1-2 sprays into both nostrils daily as needed for rhinitis.    Marland Kitchen lisinopril (PRINIVIL,ZESTRIL) 5 MG tablet Take 1 tablet (5 mg total) by mouth daily. 90 tablet 3  . metoprolol tartrate (LOPRESSOR) 25 MG tablet Take 0.5 tablets (12.5 mg total) by mouth 2 (two) times daily. 30 tablet 6  . Multiple Vitamin (MULTIVITAMIN WITH MINERALS) TABS tablet Take 1 tablet by mouth daily.    Marland Kitchen omega-3 acid ethyl esters (LOVAZA) 1 g capsule Take 1 g by mouth 2 (two) times daily.     No current facility-administered medications for this visit.      Allergies:   Codeine   Social History:  The patient  reports that she quit smoking about 15 years ago. She quit after 25.00 years of use. She has never used smokeless tobacco. She reports that she does not drink alcohol or use drugs.   Family History:   family history includes Cancer in her brother, father, and mother; Diabetes in her maternal grandmother; Heart disease in her sister; Hypertension in her brother and brother; Lung cancer in her brother; Obesity in her sister; Pancreatic cancer in her father; Stroke in her mother.    Review of Systems: Review of Systems  Constitutional: Negative.   Respiratory: Negative.   Cardiovascular: Negative.   Gastrointestinal: Negative.   Musculoskeletal: Negative.   Neurological: Negative.   Psychiatric/Behavioral: Negative.   All other systems reviewed and are negative.    PHYSICAL  EXAM: VS:  BP 138/70 (BP Location: Left Arm, Patient Position: Sitting, Cuff Size: Normal)   Pulse 88   Ht 5' 7.5" (1.715 m)   Wt 162 lb 12 oz (73.8 kg)   BMI 25.11 kg/m  , BMI Body mass index is 25.11 kg/m. Constitutional:  oriented to person, place, and time. No distress.  HENT:  Head: Normocephalic and atraumatic.  Eyes:  no discharge. No scleral icterus.  Neck: Normal range of motion. Neck supple. No JVD present.  Cardiovascular: Normal rate, regular rhythm, normal heart sounds and intact distal pulses. Exam reveals no gallop and no friction rub. No edema No murmur heard. Pulmonary/Chest: Effort normal and breath sounds normal. No stridor. No respiratory distress.  no wheezes.  no rales.  no tenderness.  Abdominal: Soft.  no distension.  no tenderness.  Musculoskeletal: Normal range of motion.  no  tenderness or deformity.  Neurological:  normal muscle tone. Coordination normal. No atrophy Skin: Skin is warm and dry. No rash noted. not diaphoretic.  Psychiatric:  normal mood and affect. behavior is normal. Thought content normal.    Recent Labs: 06/20/2017: ALT 23; BUN 14; Creatinine, Ser 1.00; Hemoglobin 13.1;  Platelets 249.0; Potassium 5.1; Sodium 138; TSH 1.64    Lipid Panel Lab Results  Component Value Date   CHOL 167 06/20/2017   HDL 74.30 06/20/2017   LDLCALC 77 06/20/2017   TRIG 80.0 06/20/2017      Wt Readings from Last 3 Encounters:  12/10/17 162 lb 12 oz (73.8 kg)  11/26/17 166 lb (75.3 kg)  06/28/17 163 lb 8 oz (74.2 kg)     ASSESSMENT AND PLAN:  Nonrheumatic aortic valve stenosis - Plan: EKG 12-Lead Doing well by echo in 2018 No symptomatic changes since that time We will hold off on echo as she is doing well  Essential hypertension Blood pressure is well controlled on today's visit. No changes made to the medications. Stable  HYPERCHOLESTEROLEMIA Total cholesterol 167, improved  Choledocholithiasis Stone extraction, recovered well Prior  colitis Chronic constipation Acceptable risk for colonoscopy tomorrow  Need for immunization against influenza - Plan: Flu Vaccine QUAD 36+ mos IM Flu shot provided today after discussion  S/P AVR Echocardiogram reviewed, no further workup needed, doing well No significant murmur on exam Stable  Disposition:   F/U  12 months   Total encounter time more than 25 minutes  Greater than 50% was spent in counseling and coordination of care with the patient    Orders Placed This Encounter  Procedures  . EKG 12-Lead     Signed, Esmond Plants, M.D., Ph.D. 12/10/2017  [place], [place]

## 2017-12-10 ENCOUNTER — Encounter: Payer: Self-pay | Admitting: Cardiovascular Disease

## 2017-12-10 ENCOUNTER — Ambulatory Visit (INDEPENDENT_AMBULATORY_CARE_PROVIDER_SITE_OTHER): Payer: Medicare Other | Admitting: Cardiovascular Disease

## 2017-12-10 VITALS — BP 138/70 | HR 88 | Ht 67.5 in | Wt 162.8 lb

## 2017-12-10 DIAGNOSIS — I10 Essential (primary) hypertension: Secondary | ICD-10-CM

## 2017-12-10 DIAGNOSIS — I350 Nonrheumatic aortic (valve) stenosis: Secondary | ICD-10-CM

## 2017-12-10 DIAGNOSIS — Z952 Presence of prosthetic heart valve: Secondary | ICD-10-CM

## 2017-12-10 DIAGNOSIS — E7800 Pure hypercholesterolemia, unspecified: Secondary | ICD-10-CM | POA: Diagnosis not present

## 2017-12-10 DIAGNOSIS — Z23 Encounter for immunization: Secondary | ICD-10-CM

## 2017-12-10 NOTE — Patient Instructions (Signed)

## 2017-12-11 ENCOUNTER — Ambulatory Visit (AMBULATORY_SURGERY_CENTER): Payer: Medicare Other | Admitting: Gastroenterology

## 2017-12-11 ENCOUNTER — Encounter: Payer: Self-pay | Admitting: Gastroenterology

## 2017-12-11 VITALS — BP 153/73 | HR 62 | Temp 98.9°F | Resp 16 | Ht 67.0 in | Wt 166.0 lb

## 2017-12-11 DIAGNOSIS — K621 Rectal polyp: Secondary | ICD-10-CM | POA: Diagnosis not present

## 2017-12-11 DIAGNOSIS — D120 Benign neoplasm of cecum: Secondary | ICD-10-CM | POA: Diagnosis not present

## 2017-12-11 DIAGNOSIS — D128 Benign neoplasm of rectum: Secondary | ICD-10-CM

## 2017-12-11 DIAGNOSIS — D129 Benign neoplasm of anus and anal canal: Secondary | ICD-10-CM

## 2017-12-11 DIAGNOSIS — Z86010 Personal history of colonic polyps: Secondary | ICD-10-CM | POA: Diagnosis present

## 2017-12-11 MED ORDER — SODIUM CHLORIDE 0.9 % IV SOLN: 500.0000 mL | Freq: Once | INTRAVENOUS | Status: DC

## 2017-12-11 NOTE — Progress Notes (Signed)
Called to room to assist during endoscopic procedure.  Patient ID and intended procedure confirmed with present staff. Received instructions for my participation in the procedure from the performing physician.  

## 2017-12-11 NOTE — Progress Notes (Signed)
Pt's states no medical or surgical changes since previsit or office visit. 

## 2017-12-11 NOTE — Op Note (Signed)
[facility] Patient Name: Heidi Middleton Procedure Date: 12/11/2017 1:45 PM MRN: [id number] Endoscopist: Ladene Artist , MD Age: 72 Referring MD:  Date of Birth: Sep 20, 1945 Gender: Female Account #: [id number] Procedure:                Colonoscopy Indications:              Surveillance: Personal history of adenomatous                            polyps on last colonoscopy > 5 years ago Medicines:                Monitored Anesthesia Care Procedure:                Pre-Anesthesia Assessment:                           - Prior to the procedure, a History and Physical                            was performed, and patient medications and                            allergies were reviewed. The patient's tolerance of                            previous anesthesia was also reviewed. The risks                            and benefits of the procedure and the sedation                            options and risks were discussed with the patient.                            All questions were answered, and informed consent                            was obtained. Prior Anticoagulants: The patient has                            taken no previous anticoagulant or antiplatelet                            agents. ASA Grade Assessment: II - A patient with                            mild systemic disease. After reviewing the risks                            and benefits, the patient was deemed in                            satisfactory condition to undergo the procedure.  After obtaining informed consent, the colonoscope                            was passed under direct vision. Throughout the                            procedure, the patient's blood pressure, pulse, and                            oxygen saturations were monitored continuously. The                            Model CF-HQ190L [id number]) scope was introduced                            through the anus and  advanced to the the cecum,                            identified by appendiceal orifice and ileocecal                            valve. The ileocecal valve, appendiceal orifice,                            and rectum were photographed. The quality of the                            bowel preparation was good. The colonoscopy was                            performed without difficulty. The patient tolerated                            the procedure well. Scope In: 2:05:52 PM Scope Out: 2:27:48 PM Scope Withdrawal Time: 0 hours 13 minutes 14 seconds  Total Procedure Duration: 0 hours 21 minutes 56 seconds  Findings:                 The perianal and digital rectal examinations were                            normal.                           Two sessile polyps were found in the rectum and                            cecum. The polyps were 4 to 5 mm in size. These                            polyps were removed with a cold biopsy forceps.                            Resection and retrieval were complete.  Scattered medium-mouthed diverticula were found in                            the transverse colon. There was no evidence of                            diverticular bleeding.                           Multiple medium-mouthed diverticula were found in                            the left colon. There was narrowing of the colon in                            association with the diverticular opening. There                            was evidence of diverticular spasm. There was no                            evidence of diverticular bleeding.                           The exam was otherwise without abnormality on                            direct and retroflexion views. Complications:            No immediate complications. Estimated blood loss:                            None. Estimated Blood Loss:     Estimated blood loss: none. Impression:               - Two 4 to 5 mm  polyps in the rectum and in the                            cecum, removed with a cold biopsy forceps. Resected                            and retrieved.                           - Mild diverticulosis in the transverse colon.                            There was no evidence of diverticular bleeding.                           - Moderate diverticulosis in the left colon.                           - The examination was otherwise normal on direct  and retroflexion views. Recommendation:           - Repeat colonoscopy in 5 years for surveillance if                            polyp(s) are precancerous, otherwise no plans for                            future surveillance due to age.                           - Patient has a contact number available for                            emergencies. The signs and symptoms of potential                            delayed complications were discussed with the                            patient. Return to normal activities tomorrow.                            Written discharge instructions were provided to the                            patient.                           - High fiber diet.                           - Continue present medications.                           - Await pathology results. Ladene Artist, MD 12/11/2017 2:31:49 PM This report has been signed electronically.

## 2017-12-11 NOTE — Progress Notes (Signed)
To PACU, VSS. Report to [email or web address] 

## 2017-12-11 NOTE — Patient Instructions (Signed)
Continue present medications. Please read handouts provided.     YOU HAD AN ENDOSCOPIC PROCEDURE TODAY AT THE [facility]:   Refer to the procedure report that was given to you for any specific questions about what was found during the examination.  If the procedure report does not answer your questions, please call your gastroenterologist to clarify.  If you requested that your care partner not be given the details of your procedure findings, then the procedure report has been included in a sealed envelope for you to review at your convenience later.  YOU SHOULD EXPECT: Some feelings of bloating in the abdomen. Passage of more gas than usual.  Walking can help get rid of the air that was put into your GI tract during the procedure and reduce the bloating. If you had a lower endoscopy (such as a colonoscopy or flexible sigmoidoscopy) you may notice spotting of blood in your stool or on the toilet paper. If you underwent a bowel prep for your procedure, you may not have a normal bowel movement for a few days.  Please Note:  You might notice some irritation and congestion in your nose or some drainage.  This is from the oxygen used during your procedure.  There is no need for concern and it should clear up in a day or so.  SYMPTOMS TO REPORT IMMEDIATELY:   Following lower endoscopy (colonoscopy or flexible sigmoidoscopy):  Excessive amounts of blood in the stool  Significant tenderness or worsening of abdominal pains  Swelling of the abdomen that is new, acute  Fever of 100F or higher    For urgent or emergent issues, a gastroenterologist can be reached at any hour by calling [phone].   DIET:  We do recommend a small meal at first, but then you may proceed to your regular diet.  Drink plenty of fluids but you should avoid alcoholic beverages for 24 hours.  ACTIVITY:  You should plan to take it easy for the rest of today and you should NOT DRIVE or use heavy machinery  until tomorrow (because of the sedation medicines used during the test).    FOLLOW UP: Our staff will call the number listed on your records the next business day following your procedure to check on you and address any questions or concerns that you may have regarding the information given to you following your procedure. If we do not reach you, we will leave a message.  However, if you are feeling well and you are not experiencing any problems, there is no need to return our call.  We will assume that you have returned to your regular daily activities without incident.  If any biopsies were taken you will be contacted by phone or by letter within the next 1-3 weeks.  Please call us at [phone] if you have not heard about the biopsies in 3 weeks.    SIGNATURES/CONFIDENTIALITY: You and/or your care partner have signed paperwork which will be entered into your electronic medical record.  These signatures attest to the fact that that the information above on your After Visit Summary has been reviewed and is understood.  Full responsibility of the confidentiality of this discharge information lies with you and/or your care-partner. 

## 2017-12-12 ENCOUNTER — Telehealth: Payer: Self-pay | Admitting: *Deleted

## 2017-12-12 NOTE — Telephone Encounter (Signed)
  Follow up Call-  Call back number 12/11/2017  Post procedure Call Back phone  # [phone]  Permission to leave phone message Yes  Some recent data might be hidden     Patient questions:  Do you have a fever, pain , or abdominal swelling? No. Pain Score  0 *  Have you tolerated food without any problems? Yes.    Have you been able to return to your normal activities? Yes.    Do you have any questions about your discharge instructions: Diet   No. Medications  No. Follow up visit  No.  Do you have questions or concerns about your Care? No.  Actions: * If pain score is 4 or above: No action needed, pain <4.

## 2017-12-27 ENCOUNTER — Encounter: Payer: Self-pay | Admitting: Gastroenterology

## 2018-01-13 ENCOUNTER — Other Ambulatory Visit: Payer: Self-pay | Admitting: Internal Medicine

## 2018-01-15 ENCOUNTER — Other Ambulatory Visit: Payer: Self-pay | Admitting: Cardiovascular Disease

## 2018-03-19 HISTORY — PX: CATARACT EXTRACTION, BILATERAL: SHX1313

## 2018-04-24 ENCOUNTER — Telehealth: Payer: Self-pay | Admitting: Family Medicine

## 2018-04-24 ENCOUNTER — Ambulatory Visit (INDEPENDENT_AMBULATORY_CARE_PROVIDER_SITE_OTHER): Payer: Medicare Other | Admitting: Family Medicine

## 2018-04-24 ENCOUNTER — Encounter: Payer: Self-pay | Admitting: Family Medicine

## 2018-04-24 VITALS — BP 134/78 | HR 106 | Temp 97.8°F | Ht 67.25 in

## 2018-04-24 DIAGNOSIS — R05 Cough: Secondary | ICD-10-CM | POA: Diagnosis not present

## 2018-04-24 DIAGNOSIS — R058 Other specified cough: Secondary | ICD-10-CM | POA: Insufficient documentation

## 2018-04-24 MED ORDER — HYDROCODONE-HOMATROPINE 5-1.5 MG/5ML PO SYRP: 5.0000 mL | ORAL_SOLUTION | Freq: Three times a day (TID) | ORAL | 0 refills | Status: DC | PRN

## 2018-04-24 MED ORDER — PREDNISONE 20 MG PO TABS: ORAL_TABLET | ORAL | 0 refills | Status: DC

## 2018-04-24 MED ORDER — BENZONATATE 200 MG PO CAPS: 200.0000 mg | ORAL_CAPSULE | Freq: Three times a day (TID) | ORAL | 1 refills | Status: DC | PRN

## 2018-04-24 NOTE — Assessment & Plan Note (Signed)
W/o other symptoms and reassuring exam  Goal is to stop the cycle Px tessalon  Also hycodan with caution of sedation Low dose prednisone for bronchial inflammation  Inc fluid intake  Update if not starting to improve in a week or if worsening  -esp if prod cough or sob or fever

## 2018-04-24 NOTE — Patient Instructions (Addendum)
Drink lots of fluids   Take tessalon as needed for cough Also hycodan - with caution of sedation  Prednisone - taper as directed   Goal is to stop the cough cycle   Update if not starting to improve in a week or if worsening

## 2018-04-24 NOTE — Telephone Encounter (Signed)
Opened in error

## 2018-04-24 NOTE — Progress Notes (Signed)
Subjective:    Patient ID: Heidi Middleton, female    DOB: 04-Dec-1945, 73 y.o.   MRN: [id number]  HPI  Here for cough and some congestion   Had a cold 2 weeks ago  Improved except for the cough  Worse to lie down  Non productive (dry and hacking) Has coughed hard enough to vomit   No headache or st  A little congestion  No fever  Took robitussin dm -no help    Patient Active Problem List   Diagnosis Date Noted  . Post-viral cough syndrome 04/24/2018  . Elevated glucose level 06/19/2017  . Colitis 09/25/2016  . Estrogen deficiency 06/26/2016  . Aortic valve stenosis   . Routine general medical examination at a health care facility 06/24/2015  . Heart murmur, systolic 09/16/1599  . Need for hepatitis C screening test 06/20/2015  . Medicare annual wellness visit, subsequent 06/20/2015  . Encounter for Medicare annual wellness exam 06/13/2012  . Osteopenia 01/31/2010  . GERD 12/21/2009  . HYPERCHOLESTEROLEMIA 10/09/2006  . Quit smoking 10/09/2006  . GLAUCOMA 10/09/2006  . Essential hypertension 10/09/2006  . 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 OSTEOARTHRITIS 10/09/2006  . COLONIC POLYPS, HX OF 10/09/2006   Past Medical History:  Diagnosis Date  . Adenomatous colon polyp 05/1985  . Allergy   . Aortic stenosis   . Cataract   . Cholelithiasis   . Diverticulosis   . GERD (gastroesophageal reflux disease)   . Headache    migraines prior to hysterectomy  . Heart murmur   . Hyperlipidemia   . Hypertension   . Osteopenia   . PONV (postoperative nausea and vomiting)    nausea  . Shingles    Past Surgical History:  Procedure Laterality Date  . ABDOMINAL HYSTERECTOMY    . AORTIC VALVE REPLACEMENT N/A 08/16/2015   Procedure: AORTIC VALVE REPLACEMENT (AVR);  Surgeon: Ivin Poot, MD;  Location: [facility];  Service: Open Heart Surgery;  Laterality: N/A;  . APPENDECTOMY     taken out with hysterectomy  . BREAST BIOPSY Right 1990   EXCISIONAL - NEG  . CARDIAC  CATHETERIZATION Bilateral 08/04/2015   Procedure: Right/Left Heart Cath and Coronary Angiography;  Surgeon: Wellington Hampshire, MD;  Location: [place] [facility];  Service: Cardiovascular;  Laterality: Bilateral;  . ERCP N/A 10/31/2016   Procedure: ENDOSCOPIC RETROGRADE CHOLANGIOPANCREATOGRAPHY (ERCP);  Surgeon: Ladene Artist, MD;  Location: [facility] ENDOSCOPY;  Service: Endoscopy;  Laterality: N/A;  . EYE SURGERY N/A    detatched retina- "not sure which eye"  . KNEE SURGERY  10/2005   calcinosis - in office procedure  . TEE WITHOUT CARDIOVERSION N/A 08/16/2015   Procedure: TRANSESOPHAGEAL ECHOCARDIOGRAM (TEE);  Surgeon: Ivin Poot, MD;  Location: [place];  Service: Open Heart Surgery;  Laterality: N/A;   Social History   Tobacco Use  . Smoking status: Former Smoker    Years: 25.00    Last attempt to quit: 07/07/2002    Years since quitting: 15.8  . Smokeless tobacco: Never Used  . Tobacco comment: light smoker before quitting   Substance Use Topics  . Alcohol use: No    Alcohol/week: 0.0 standard drinks  . Drug use: No   Family History  Problem Relation Age of Onset  . Cancer Mother        lung  . Stroke Mother   . Cancer Father        pancreatic  . Pancreatic cancer Father   . Heart disease Sister   .  Hypertension Brother   . Cancer Brother   . Diabetes Maternal Grandmother   . Hypertension Brother   . Obesity Sister   . Lung cancer Brother   . Breast cancer Neg Hx   . Colon cancer Neg Hx   . Esophageal cancer Neg Hx   . Rectal cancer Neg Hx   . Stomach cancer Neg Hx    Allergies  Allergen Reactions  . Codeine Nausea And Vomiting   Current Outpatient Medications on File Prior to Visit  Medication Sig Dispense Refill  . acetaminophen (TYLENOL) 325 MG tablet Take 650 mg by mouth every 6 (six) hours as needed for mild pain, moderate pain, fever or headache.    . ALPRAZolam (XANAX) 0.5 MG tablet Take 1 tablet (0.5 mg total) by mouth daily as needed for anxiety (airplane  travel). 10 tablet 0  . aspirin EC 325 MG tablet Take 325 mg by mouth daily.    Marland Kitchen atorvastatin (LIPITOR) 10 MG tablet TAKE 0.5 TABLETS (5 MG TOTAL) BY MOUTH DAILY. 45 tablet 3  . Calcium Carb-Cholecalciferol (CALCIUM 600 + D PO) Take 1 tablet by mouth daily.    . cholecalciferol (VITAMIN D) 1000 UNITS tablet Take 1,000 Units by mouth 2 (two) times daily.     Marland Kitchen esomeprazole (NEXIUM) 40 MG capsule TAKE 1 CAPSULE (40 MG TOTAL) BY MOUTH DAILY BEFORE BREAKFAST. 90 capsule 3  . fluticasone (FLONASE) 50 MCG/ACT nasal spray Place 1-2 sprays into both nostrils daily as needed for rhinitis.    Marland Kitchen lisinopril (PRINIVIL,ZESTRIL) 5 MG tablet TAKE 1 TABLET BY MOUTH EVERY DAY 90 tablet 2  . metoprolol tartrate (LOPRESSOR) 25 MG tablet TAKE 0.5 TABLETS (12.5 MG TOTAL) BY MOUTH 2 (TWO) TIMES DAILY. 90 tablet 3  . Multiple Vitamin (MULTIVITAMIN WITH MINERALS) TABS tablet Take 1 tablet by mouth daily.    Marland Kitchen omega-3 acid ethyl esters (LOVAZA) 1 g capsule Take 1 g by mouth 2 (two) times daily.     No current facility-administered medications on file prior to visit.     Review of Systems  Constitutional: Negative for activity change, appetite change, fatigue, fever and unexpected weight change.  HENT: Positive for postnasal drip. Negative for congestion, ear pain, rhinorrhea, sinus pressure and sore throat.   Eyes: Negative for pain, redness and visual disturbance.  Respiratory: Positive for cough and chest tightness. Negative for shortness of breath, wheezing and stridor.   Cardiovascular: Negative for chest pain and palpitations.  Gastrointestinal: Negative for abdominal pain, blood in stool, constipation and diarrhea.  Endocrine: Negative for polydipsia and polyuria.  Genitourinary: Negative for dysuria, frequency and urgency.  Musculoskeletal: Negative for arthralgias, back pain and myalgias.  Skin: Negative for pallor and rash.  Allergic/Immunologic: Negative for environmental allergies.  Neurological:  Negative for dizziness, syncope and headaches.  Hematological: Negative for adenopathy. Does not bruise/bleed easily.  Psychiatric/Behavioral: Negative for decreased concentration and dysphoric mood. The patient is not nervous/anxious.        Objective:   Physical Exam Constitutional:      General: She is not in acute distress.    Appearance: Normal appearance. She is normal weight. She is not ill-appearing.  HENT:     Head: Normocephalic and atraumatic.     Comments: No sinus tenderness    Right Ear: Tympanic membrane and ear canal normal.     Left Ear: Tympanic membrane and ear canal normal.     Nose: Rhinorrhea present. No congestion.     Mouth/Throat:  Mouth: Mucous membranes are moist.     Pharynx: Oropharynx is clear.     Comments: Mild pnd Eyes:     General:        Right eye: No discharge.        Left eye: No discharge.     Conjunctiva/sclera: Conjunctivae normal.     Pupils: Pupils are equal, round, and reactive to light.  Neck:     Musculoskeletal: No neck rigidity.  Cardiovascular:     Rate and Rhythm: Normal rate and regular rhythm.     Heart sounds: Normal heart sounds.  Pulmonary:     Effort: Pulmonary effort is normal. No respiratory distress.     Breath sounds: No stridor. Wheezing present. No rhonchi or rales.     Comments: Persistent hacking cough  Few scattered wheezes Chest:     Chest wall: No tenderness.  Lymphadenopathy:     Cervical: No cervical adenopathy.  Skin:    General: Skin is warm and dry.     Findings: No rash.  Neurological:     Mental Status: She is alert. Mental status is at baseline.     Cranial Nerves: No cranial nerve deficit.  Psychiatric:        Mood and Affect: Mood normal.           Assessment & Plan:   Problem List Items Addressed This Visit      Respiratory   Post-viral cough syndrome - Primary    W/o other symptoms and reassuring exam  Goal is to stop the cycle Px tessalon  Also hycodan with caution of  sedation Low dose prednisone for bronchial inflammation  Inc fluid intake  Update if not starting to improve in a week or if worsening  -esp if prod cough or sob or fever

## 2018-05-11 DIAGNOSIS — Y9389 Activity, other specified: Secondary | ICD-10-CM | POA: Insufficient documentation

## 2018-05-11 DIAGNOSIS — Z87891 Personal history of nicotine dependence: Secondary | ICD-10-CM | POA: Insufficient documentation

## 2018-05-11 DIAGNOSIS — Y92019 Unspecified place in single-family (private) house as the place of occurrence of the external cause: Secondary | ICD-10-CM | POA: Insufficient documentation

## 2018-05-11 DIAGNOSIS — Z7982 Long term (current) use of aspirin: Secondary | ICD-10-CM | POA: Insufficient documentation

## 2018-05-11 DIAGNOSIS — I10 Essential (primary) hypertension: Secondary | ICD-10-CM | POA: Insufficient documentation

## 2018-05-11 DIAGNOSIS — J45909 Unspecified asthma, uncomplicated: Secondary | ICD-10-CM | POA: Insufficient documentation

## 2018-05-11 DIAGNOSIS — Y998 Other external cause status: Secondary | ICD-10-CM | POA: Insufficient documentation

## 2018-05-11 DIAGNOSIS — S93122A Dislocation of metatarsophalangeal joint of left great toe, initial encounter: Secondary | ICD-10-CM | POA: Insufficient documentation

## 2018-05-11 DIAGNOSIS — Z952 Presence of prosthetic heart valve: Secondary | ICD-10-CM | POA: Insufficient documentation

## 2018-05-11 DIAGNOSIS — W108XXA Fall (on) (from) other stairs and steps, initial encounter: Secondary | ICD-10-CM | POA: Diagnosis not present

## 2018-05-11 DIAGNOSIS — S99922A Unspecified injury of left foot, initial encounter: Secondary | ICD-10-CM | POA: Diagnosis present

## 2018-05-12 ENCOUNTER — Emergency Department (HOSPITAL_COMMUNITY): Payer: Medicare Other

## 2018-05-12 ENCOUNTER — Other Ambulatory Visit: Payer: Self-pay

## 2018-05-12 ENCOUNTER — Emergency Department (HOSPITAL_COMMUNITY)
Admission: EM | Admit: 2018-05-12 | Discharge: 2018-05-12 | Disposition: A | Discharge status: Home / Self Care | Payer: Medicare Other | Attending: Emergency Medicine | Admitting: Emergency Medicine

## 2018-05-12 ENCOUNTER — Encounter (HOSPITAL_COMMUNITY): Payer: Self-pay | Admitting: Emergency Medicine

## 2018-05-12 DIAGNOSIS — S93123A Dislocation of metatarsophalangeal joint of unspecified great toe, initial encounter: Secondary | ICD-10-CM

## 2018-05-12 DIAGNOSIS — S93122A Dislocation of metatarsophalangeal joint of left great toe, initial encounter: Secondary | ICD-10-CM | POA: Diagnosis not present

## 2018-05-12 DIAGNOSIS — W19XXXA Unspecified fall, initial encounter: Secondary | ICD-10-CM

## 2018-05-12 MED ORDER — ACETAMINOPHEN 500 MG PO TABS
500.0000 mg | ORAL_TABLET | Freq: Once | ORAL | Status: AC
  Administered 2018-05-12: 500 mg via ORAL
  Filled 2018-05-12: qty 1

## 2018-05-12 NOTE — Progress Notes (Signed)
Orthopedic Tech Progress Note Patient Details:  Heidi Middleton July 10, 1945 [id number]  Ortho Devices Type of Ortho Device: Post (short leg) splint Ortho Device/Splint Interventions: Application, Ordered   Post Interventions Patient Tolerated: Well Instructions Provided: Adjustment of device, Care of device, Poper ambulation with device   Melony Overly T 05/12/2018, 2:21 AM

## 2018-05-12 NOTE — Discharge Instructions (Addendum)
You have been seen today for a foot injury. There was initially a dislocation noted on x-ray.  This was able to be reduced at the bedside.  After the x-ray was repeated, the joint was back in place, but there seems to be a possible fracture. Pain: May use ibuprofen, naproxen, or Tylenol. Ice: May apply ice to the area over the next 24 hours for 15 minutes at a time to reduce swelling. Elevation: Keep the extremity elevated as often as possible to reduce pain and inflammation. Support: Wear the post op shoe for support and comfort. Wear this until pain resolves.  Ideally, you would be nonweightbearing until seen by the orthopedist. Follow up: Follow-up with the orthopedic specialist within the next week or two on this matter. Return: Return to the ED for numbness, weakness, increasing pain, overall worsening symptoms, loss of function, or if symptoms are not improving, you have tried to follow up with the orthopedic specialist, and have been unable to do so.  For prescription assistance, may try using prescription discount sites or apps, such as goodrx.com

## 2018-05-12 NOTE — ED Triage Notes (Signed)
Pt missed bottom step and fell just prior to arrival.  She was taking her dog and her daughter's dogs out and was carrying a dog when she fell.  C/o pain to L foot and L great toe.  Also c/o R hip pain.

## 2018-05-12 NOTE — ED Notes (Signed)
Pt in XR, to be brought back to room

## 2018-05-12 NOTE — ED Provider Notes (Signed)
[facility] EMERGENCY DEPARTMENT Provider Note   CSN: [id number] Arrival date & time: 05/11/18  2331    History   Chief Complaint Chief Complaint  Patient presents with  . Fall  . Foot Pain  . Hip Pain    HPI Heidi Middleton is a 73 y.o. female.     HPI   Heidi Middleton is a 73 y.o. female, with a history of HTN and hyperlipidemia, presenting to the ED with injuries from a mechanical fall that occurred shortly prior to arrival.  States she was walking down the steps from her home, missed the bottom step, and fell, twisting her left foot and landing on her right hip. Pain is worst in the left first toe, throbbing, severe, nonradiating.  Also complains of right hip pain, described as a soreness, mild, nonradiating. Denies head injury, LOC, nausea/vomiting, neck/back pain, chest pain, shortness of breath, abdominal pain, numbness, weakness, other extremity pain, or any other complaints.   Past Medical History:  Diagnosis Date  . Adenomatous colon polyp 05/1985  . Allergy   . Aortic stenosis   . Cataract   . Cholelithiasis   . Diverticulosis   . GERD (gastroesophageal reflux disease)   . Headache    migraines prior to hysterectomy  . Heart murmur   . Hyperlipidemia   . Hypertension   . Osteopenia   . PONV (postoperative nausea and vomiting)    nausea  . Shingles     Patient Active Problem List   Diagnosis Date Noted  . Post-viral cough syndrome 04/24/2018  . Elevated glucose level 06/19/2017  . Colitis 09/25/2016  . Estrogen deficiency 06/26/2016  . Aortic valve stenosis   . Routine general medical examination at a health care facility 06/24/2015  . Heart murmur, systolic 35/57/3220  . Need for hepatitis C screening test 06/20/2015  . Medicare annual wellness visit, subsequent 06/20/2015  . Encounter for Medicare annual wellness exam 06/13/2012  . Osteopenia 01/31/2010  . GERD 12/21/2009  . HYPERCHOLESTEROLEMIA 10/09/2006  . Quit smoking  10/09/2006  . GLAUCOMA 10/09/2006  . Essential hypertension 10/09/2006  . 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 OSTEOARTHRITIS 10/09/2006  . COLONIC POLYPS, HX OF 10/09/2006    Past Surgical History:  Procedure Laterality Date  . ABDOMINAL HYSTERECTOMY    . AORTIC VALVE REPLACEMENT N/A 08/16/2015   Procedure: AORTIC VALVE REPLACEMENT (AVR);  Surgeon: Ivin Poot, MD;  Location: [place];  Service: Open Heart Surgery;  Laterality: N/A;  . APPENDECTOMY     taken out with hysterectomy  . BREAST BIOPSY Right 1990   EXCISIONAL - NEG  . CARDIAC CATHETERIZATION Bilateral 08/04/2015   Procedure: Right/Left Heart Cath and Coronary Angiography;  Surgeon: Wellington Hampshire, MD;  Location: [place] [facility];  Service: Cardiovascular;  Laterality: Bilateral;  . ERCP N/A 10/31/2016   Procedure: ENDOSCOPIC RETROGRADE CHOLANGIOPANCREATOGRAPHY (ERCP);  Surgeon: Ladene Artist, MD;  Location: [facility] ENDOSCOPY;  Service: Endoscopy;  Laterality: N/A;  . EYE SURGERY N/A    detatched retina- "not sure which eye"  . KNEE SURGERY  10/2005   calcinosis - in office procedure  . TEE WITHOUT CARDIOVERSION N/A 08/16/2015   Procedure: TRANSESOPHAGEAL ECHOCARDIOGRAM (TEE);  Surgeon: Ivin Poot, MD;  Location: [place];  Service: Open Heart Surgery;  Laterality: N/A;     OB History   No obstetric history on file.      Home Medications    Prior to Admission medications   Medication Sig Start  Date End Date Taking? Authorizing Provider  acetaminophen (TYLENOL) 325 MG tablet Take 650 mg by mouth every 6 (six) hours as needed for mild pain, moderate pain, fever or headache.    [provider]  ALPRAZolam Duanne Moron) 0.5 MG tablet Take 1 tablet (0.5 mg total) by mouth daily as needed for anxiety (airplane travel). 06/28/17   Tower, Wynelle Fanny, MD  aspirin EC 325 MG tablet Take 325 mg by mouth daily.    [provider]  atorvastatin (LIPITOR) 10 MG tablet TAKE 0.5 TABLETS (5 MG TOTAL) BY MOUTH DAILY.  06/28/17   Tower, Wynelle Fanny, MD  benzonatate (TESSALON) 200 MG capsule Take 1 capsule (200 mg total) by mouth 3 (three) times daily as needed for cough. Do not bite pill 04/24/18   Tower, Wynelle Fanny, MD  Calcium Carb-Cholecalciferol (CALCIUM 600 + D PO) Take 1 tablet by mouth daily.    [provider]  cholecalciferol (VITAMIN D) 1000 UNITS tablet Take 1,000 Units by mouth 2 (two) times daily.     [provider]  esomeprazole (NEXIUM) 40 MG capsule TAKE 1 CAPSULE (40 MG TOTAL) BY MOUTH DAILY BEFORE BREAKFAST. 06/28/17   Tower, Wynelle Fanny, MD  fluticasone (FLONASE) 50 MCG/ACT nasal spray Place 1-2 sprays into both nostrils daily as needed for rhinitis.    [provider]  HYDROcodone-homatropine (HYCODAN) 5-1.5 MG/5ML syrup Take 5 mLs by mouth every 8 (eight) hours as needed for cough. Caution of sedation 04/24/18   Tower, Roque Lias A, MD  lisinopril (PRINIVIL,ZESTRIL) 5 MG tablet TAKE 1 TABLET BY MOUTH EVERY DAY 01/15/18   Minna Merritts, MD  metoprolol tartrate (LOPRESSOR) 25 MG tablet TAKE 0.5 TABLETS (12.5 MG TOTAL) BY MOUTH 2 (TWO) TIMES DAILY. 01/14/18   End, Harrell Gave, MD  Multiple Vitamin (MULTIVITAMIN WITH MINERALS) TABS tablet Take 1 tablet by mouth daily.    [provider]  omega-3 acid ethyl esters (LOVAZA) 1 g capsule Take 1 g by mouth 2 (two) times daily.    [provider]  predniSONE (DELTASONE) 20 MG tablet Take 1 pill by mouth for 3 days then 1/2 pill daily for 3 days then stop 04/24/18   Tower, Wynelle Fanny, MD    Family History Family History  Problem Relation Age of Onset  . Cancer Mother        lung  . Stroke Mother   . Cancer Father        pancreatic  . Pancreatic cancer Father   . Heart disease Sister   . Hypertension Brother   . Cancer Brother   . Diabetes Maternal Grandmother   . Hypertension Brother   . Obesity Sister   . Lung cancer Brother   . Breast cancer Neg Hx   . Colon cancer Neg Hx   . Esophageal cancer Neg Hx   . Rectal  cancer Neg Hx   . Stomach cancer Neg Hx     Social History Social History   Tobacco Use  . Smoking status: Former Smoker    Years: 25.00    Last attempt to quit: 07/07/2002    Years since quitting: 15.8  . Smokeless tobacco: Never Used  . Tobacco comment: light smoker before quitting   Substance Use Topics  . Alcohol use: No    Alcohol/week: 0.0 standard drinks  . Drug use: No     Allergies   Codeine   Review of Systems Review of Systems  Constitutional: Negative for diaphoresis.  Respiratory: Negative for shortness of  breath.   Cardiovascular: Negative for chest pain.  Gastrointestinal: Negative for abdominal pain, nausea and vomiting.  Musculoskeletal: Positive for arthralgias. Negative for back pain and neck pain.  Neurological: Negative for dizziness, syncope, weakness, light-headedness, numbness and headaches.  All other systems reviewed and are negative.    Physical Exam Updated Vital Signs BP (!) 168/72 (BP Location: Right Arm)   Pulse 81   Temp 97.7 F (36.5 C) (Oral)   Resp 18   SpO2 96%   Physical Exam Vitals signs and nursing note reviewed.  Constitutional:      General: She is not in acute distress.    Appearance: She is well-developed. She is not diaphoretic.  HENT:     Head: Normocephalic and atraumatic.     Mouth/Throat:     Mouth: Mucous membranes are moist.     Pharynx: Oropharynx is clear.  Eyes:     Conjunctiva/sclera: Conjunctivae normal.  Neck:     Musculoskeletal: Neck supple.  Cardiovascular:     Rate and Rhythm: Normal rate and regular rhythm.     Pulses: Normal pulses.          Radial pulses are 2+ on the right side and 2+ on the left side.       Dorsalis pedis pulses are 2+ on the right side and 2+ on the left side.       Posterior tibial pulses are 2+ on the right side and 2+ on the left side.     Heart sounds: Normal heart sounds.  Pulmonary:     Effort: Pulmonary effort is normal. No respiratory distress.     Breath  sounds: Normal breath sounds.  Abdominal:     Palpations: Abdomen is soft.     Tenderness: There is no abdominal tenderness. There is no guarding.  Musculoskeletal:     Right lower leg: No edema.     Left lower leg: No edema.     Comments: Tenderness and some swelling to the left first MTP joint with question of deformity.  No tenderness, swelling, deformity, or color change to the remainder of the foot or toes.  Some tenderness to the right lateral hip without swelling, deformity, or instability.  Full range of motion through the cardinal directions of the right hip without noted difficulty or significant pain.  She similarly has full range of motion in the left hip as well as the bilateral knees and bilateral ankles.  Normal motor function intact in all other extremities. No midline spinal tenderness.  Overall trauma exam performed without any abnormalities noted other than those mentioned.  Lymphadenopathy:     Cervical: No cervical adenopathy.  Skin:    General: Skin is warm and dry.     Capillary Refill: Capillary refill takes less than 2 seconds.  Neurological:     Mental Status: She is alert.     Comments: Sensation grossly intact to light touch in the extremities. Strength 5/5 in all extremities. Coordination intact. Cranial nerves III-XII grossly intact. No facial droop.   Specifically, sensation to light touch is grossly intact to the left foot and toes. Following reduction of the first MTP joint, patient had full range of motion without noted difficulty in that joint.  Psychiatric:        Mood and Affect: Mood and affect normal.        Speech: Speech normal.        Behavior: Behavior normal.      ED Treatments / Results  Labs (all labs ordered are listed, but only abnormal results are displayed) Labs Reviewed - No data to display  EKG None  Radiology Dg Foot Complete Left  Result Date: 05/12/2018 CLINICAL DATA:  73 year old female with fall and left foot pain.  EXAM: LEFT FOOT - COMPLETE 3+ VIEW COMPARISON:  None FINDINGS: There is dorsal dislocation of the great toe at the MTP joint. No definite acute fracture. The bones are osteopenic. The soft tissue swelling over the forefoot. No radiopaque foreign object or soft tissue gas. IMPRESSION: Dorsal dislocation of the first MTP joint. No definite acute fracture. Electronically Signed   By: Anner Crete M.D.   On: 05/12/2018 00:49   Dg Toe Great Left  Result Date: 05/12/2018 CLINICAL DATA:  Postreduction MTP dislocation. EXAM: LEFT GREAT TOE COMPARISON:  05/12/2018 FINDINGS: Anatomic position of the left first metatarsal-phalangeal joint postreduction. Irregularity in the metatarsal head may indicate mild impaction fracture. Soft tissue swelling. IMPRESSION: Anatomic position of the left first metatarsophalangeal joint postreduction. Possible mild impaction fracture of the metatarsal head. Electronically Signed   By: Lucienne Capers M.D.   On: 05/12/2018 01:55   Dg Hip Unilat  With Pelvis 2-3 Views Right  Result Date: 05/12/2018 CLINICAL DATA:  72 year old female with fall and right hip pain. EXAM: DG HIP (WITH OR WITHOUT PELVIS) 2-3V RIGHT COMPARISON:  None. FINDINGS: There is no acute fracture or dislocation. The bones are osteopenic. Mild bilateral hip osteoarthritic changes. The soft tissues appear unremarkable. IMPRESSION: No acute fracture or dislocation. Electronically Signed   By: Anner Crete M.D.   On: 05/12/2018 00:48    Procedures Reduction of dislocation Date/Time: 05/12/2018 1:00 AM Performed by: Lorayne Bender, PA-C Authorized by: Lorayne Bender, PA-C  Consent: Verbal consent obtained. Risks and benefits: risks, benefits and alternatives were discussed Consent given by: patient Patient identity confirmed: verbally with patient and provided demographic data Local anesthesia used: no  Anesthesia: Local anesthesia used: no  Sedation: Patient sedated: no  Patient tolerance: Patient  tolerated the procedure well with no immediate complications Comments: Reduction of dorsal dislocation of left first MTP joint.    (including critical care time)  Medications Ordered in ED Medications  acetaminophen (TYLENOL) tablet 500 mg (500 mg Oral Given 05/12/18 0150)     Initial Impression / Assessment and Plan / ED Course  I have reviewed the triage vital signs and the nursing notes.  Pertinent labs & imaging results that were available during my care of the patient were reviewed by me and considered in my medical decision making (see chart for details).        Patient presents with injuries following mechanical fall.  She was noted to have a first MTP joint dislocation, which was reduced successfully at the bedside.  Postreduction x-ray showed successful reduction, however, does show question of fracture.  Patient was placed in a posterior splint, made nonweightbearing, given crutches, and will follow-up with Ortho. The patient was given instructions for home care as well as return precautions. Patient voices understanding of these instructions, accepts the plan, and is comfortable with discharge.    Findings and plan of care discussed with [facility], DO.    Patient's hypertension was noted.  She has a history of the same, but voices compliance with her medications.  She does state she initially was in a great deal of pain due to her foot and then she became nervous because the first blood pressure reading was high. My suspicion for hypertension from  head injury or hypertensive emergency is quite low.  She will follow-up with her PCP on this matter.  Final Clinical Impressions(s) / ED Diagnoses   Final diagnoses:  Traumatic dislocation of metatarsophalangeal (MTP) joint of great toe  Fall, initial encounter    ED Discharge Orders    None       Layla Maw 05/12/18 1483    Ward, Delice Bison, DO 05/12/18 [phone]

## 2018-05-14 ENCOUNTER — Ambulatory Visit (INDEPENDENT_AMBULATORY_CARE_PROVIDER_SITE_OTHER): Payer: Medicare Other | Admitting: Orthopaedic Surgery

## 2018-05-14 ENCOUNTER — Encounter (INDEPENDENT_AMBULATORY_CARE_PROVIDER_SITE_OTHER): Payer: Self-pay | Admitting: Orthopaedic Surgery

## 2018-05-14 DIAGNOSIS — S93105A Unspecified dislocation of left toe(s), initial encounter: Secondary | ICD-10-CM

## 2018-05-14 DIAGNOSIS — S93105D Unspecified dislocation of left toe(s), subsequent encounter: Secondary | ICD-10-CM | POA: Insufficient documentation

## 2018-05-14 NOTE — Progress Notes (Signed)
Office Visit Note   Patient: Heidi Middleton           Date of Birth: 1945-11-04           MRN: [id number] Visit Date: 05/14/2018              Requested by: Tower, Wynelle Fanny, MD [place], [place] [address] PCP: Abner Greenspan, MD   Assessment & Plan: Visit Diagnoses:  1. Toe dislocation, left, initial encounter     Plan: Impression is left foot first MTP joint dislocation.  This was reduced in the ED.  We will transition the patient into a postoperative shoe weightbearing as tolerated.  We will buddy tape the first 2 toes.  She will ice and elevate for pain and swelling.  She will follow-up with [place] in 2 weeks time for repeat evaluation and x-rays.    Follow-Up Instructions: Return in about 2 weeks (around 05/28/2018).   Orders:  No orders of the defined types were placed in this encounter.  No orders of the defined types were placed in this encounter.     Procedures: No procedures performed   Clinical Data: No additional findings.   Subjective: Chief Complaint  Patient presents with  . Left Foot - Pain    HPI patient is a pleasant 73 year old female who presents our clinic today following an injury to her left great toe.  She was going down a set of stairs on 05/12/2018 when she missed the last step falling onto her right side.  She was seen in the ED where x-rays were obtained.  She sustained a dorsal dislocation of the first MTP joint.  This was reduced and confirmed on x-ray.  She was placed in a posterior splint nonweightbearing.  She comes in today for further evaluation and treatment recommendation.  She denies any pain.  She does have slight tingling to her left foot.  She has been icing and elevating for swelling.  Review of Systems as detailed in HPI.  All others reviewed and are negative.   Objective: Vital Signs: There were no vitals taken for this visit.  Physical Exam well-developed and well-nourished female no acute distress.  Alert and  oriented x3.  Ortho Exam examination of the left foot reveals moderate swelling and ecchymosis throughout.  Mild tenderness to the first MTP joint.  She is neurovascularly intact distally.  Specialty Comments:  No specialty comments available.  Imaging: No new imaging   PMFS History: Patient Active Problem List   Diagnosis Date Noted  . Toe dislocation, left, initial encounter 05/14/2018  . Post-viral cough syndrome 04/24/2018  . Elevated glucose level 06/19/2017  . Colitis 09/25/2016  . Estrogen deficiency 06/26/2016  . Aortic valve stenosis   . Routine general medical examination at a health care facility 06/24/2015  . Heart murmur, systolic 00/93/8182  . Need for hepatitis C screening test 06/20/2015  . Medicare annual wellness visit, subsequent 06/20/2015  . Encounter for Medicare annual wellness exam 06/13/2012  . Osteopenia 01/31/2010  . GERD 12/21/2009  . HYPERCHOLESTEROLEMIA 10/09/2006  . Quit smoking 10/09/2006  . GLAUCOMA 10/09/2006  . Essential hypertension 10/09/2006  . 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 OSTEOARTHRITIS 10/09/2006  . COLONIC POLYPS, HX OF 10/09/2006   Past Medical History:  Diagnosis Date  . Adenomatous colon polyp 05/1985  . Allergy   . Aortic stenosis   . Cataract   . Cholelithiasis   . Diverticulosis   . GERD (gastroesophageal reflux  disease)   . Headache    migraines prior to hysterectomy  . Heart murmur   . Hyperlipidemia   . Hypertension   . Osteopenia   . PONV (postoperative nausea and vomiting)    nausea  . Shingles     Family History  Problem Relation Age of Onset  . Cancer Mother        lung  . Stroke Mother   . Cancer Father        pancreatic  . Pancreatic cancer Father   . Heart disease Sister   . Hypertension Brother   . Cancer Brother   . Diabetes Maternal Grandmother   . Hypertension Brother   . Obesity Sister   . Lung cancer Brother   . Breast cancer Neg Hx   . Colon cancer Neg Hx   . Esophageal  cancer Neg Hx   . Rectal cancer Neg Hx   . Stomach cancer Neg Hx     Past Surgical History:  Procedure Laterality Date  . ABDOMINAL HYSTERECTOMY    . AORTIC VALVE REPLACEMENT N/A 08/16/2015   Procedure: AORTIC VALVE REPLACEMENT (AVR);  Surgeon: Ivin Poot, MD;  Location: [facility];  Service: Open Heart Surgery;  Laterality: N/A;  . APPENDECTOMY     taken out with hysterectomy  . BREAST BIOPSY Right 1990   EXCISIONAL - NEG  . CARDIAC CATHETERIZATION Bilateral 08/04/2015   Procedure: Right/Left Heart Cath and Coronary Angiography;  Surgeon: Wellington Hampshire, MD;  Location: [facility];  Service: Cardiovascular;  Laterality: Bilateral;  . ERCP N/A 10/31/2016   Procedure: ENDOSCOPIC RETROGRADE CHOLANGIOPANCREATOGRAPHY (ERCP);  Surgeon: Ladene Artist, MD;  Location: [facility] ENDOSCOPY;  Service: Endoscopy;  Laterality: N/A;  . EYE SURGERY N/A    detatched retina- "not sure which eye"  . KNEE SURGERY  10/2005   calcinosis - in office procedure  . TEE WITHOUT CARDIOVERSION N/A 08/16/2015   Procedure: TRANSESOPHAGEAL ECHOCARDIOGRAM (TEE);  Surgeon: Ivin Poot, MD;  Location: [facility];  Service: Open Heart Surgery;  Laterality: N/A;   Social History   Occupational History  . Occupation: Pharmacist, hospital  Tobacco Use  . Smoking status: Former Smoker    Years: 25.00    Last attempt to quit: 07/07/2002    Years since quitting: 15.8  . Smokeless tobacco: Never Used  . Tobacco comment: light smoker before quitting   Substance and Sexual Activity  . Alcohol use: No    Alcohol/week: 0.0 standard drinks  . Drug use: No  . Sexual activity: Never

## 2018-05-22 ENCOUNTER — Other Ambulatory Visit: Payer: Self-pay | Admitting: Family Medicine

## 2018-05-22 DIAGNOSIS — Z1231 Encounter for screening mammogram for malignant neoplasm of breast: Secondary | ICD-10-CM

## 2018-05-27 ENCOUNTER — Ambulatory Visit (INDEPENDENT_AMBULATORY_CARE_PROVIDER_SITE_OTHER): Payer: Medicare Other

## 2018-05-27 ENCOUNTER — Encounter (INDEPENDENT_AMBULATORY_CARE_PROVIDER_SITE_OTHER): Payer: Self-pay | Admitting: Orthopaedic Surgery

## 2018-05-27 ENCOUNTER — Ambulatory Visit (INDEPENDENT_AMBULATORY_CARE_PROVIDER_SITE_OTHER): Payer: Medicare Other | Admitting: Orthopaedic Surgery

## 2018-05-27 VITALS — Ht 68.0 in | Wt 166.0 lb

## 2018-05-27 DIAGNOSIS — S93105D Unspecified dislocation of left toe(s), subsequent encounter: Secondary | ICD-10-CM

## 2018-05-27 DIAGNOSIS — S93105A Unspecified dislocation of left toe(s), initial encounter: Secondary | ICD-10-CM

## 2018-05-27 NOTE — Progress Notes (Signed)
Office Visit Note   Patient: Heidi Middleton           Date of Birth: Dec 18, 1945           MRN: [id number] Visit Date: 05/27/2018              Requested by: Tower, Wynelle Fanny, MD [place], [place] [address] PCP: Abner Greenspan, MD   Assessment & Plan: Visit Diagnoses:  1. Toe dislocation, left, subsequent encounter   2. Toe dislocation, left, initial encounter     Plan: Impression is 2 weeks status post left first MTP P joint dislocation with reduction.  We will keep the patient in a postoperative shoe for another 2 weeks weightbearing as tolerated.  She will follow-up with [place] in 2 weeks time for recheck.  Follow-Up Instructions: Return in about 2 weeks (around 06/10/2018).   Orders:  Orders Placed This Encounter  Procedures  . XR Toe Great Left   No orders of the defined types were placed in this encounter.     Procedures: No procedures performed   Clinical Data: No additional findings.   Subjective: Chief Complaint  Patient presents with  . Left Foot - Follow-up    Left foot first MTP dislocation reduced in ER 05/12/2018    HPI patient is a pleasant 73 year old female presents our clinic today 2 weeks status post left foot first MTP joint dislocation.  This was reduced in the ED.  She comes in today for follow-up.  She has been weightbearing as tolerated in a postop shoe and buddy taping the first 2 toes together.  Her pain has improved, but she still admits to occasional pain at times.  She is taking over-the-counter medications for this.  Review of Systems as detailed in HPI.  All others reviewed and are negative.   Objective: Vital Signs: Ht 5\' 8"  (1.727 m)   Wt 166 lb (75.3 kg)   BMI 25.24 kg/m   Physical Exam well-developed and well-nourished female in no acute distress.  Alert and oriented x3.  Ortho Exam examination of her left foot reveals no swelling.  Moderate tenderness over the first MTP joint.  Limited range of motion secondary  to stiffness.  She is neurovascularly intact distally.  Specialty Comments:  No specialty comments available.  Imaging: Xr Toe Great Left  Result Date: 05/27/2018 First MTP joint still appears to be well reduced.  No acute or structural abnormalities    PMFS History: Patient Active Problem List   Diagnosis Date Noted  . Toe dislocation, left, subsequent encounter 05/14/2018  . Post-viral cough syndrome 04/24/2018  . Elevated glucose level 06/19/2017  . Colitis 09/25/2016  . Estrogen deficiency 06/26/2016  . Aortic valve stenosis   . Routine general medical examination at a health care facility 06/24/2015  . Heart murmur, systolic 53/66/4403  . Need for hepatitis C screening test 06/20/2015  . Medicare annual wellness visit, subsequent 06/20/2015  . Encounter for Medicare annual wellness exam 06/13/2012  . Osteopenia 01/31/2010  . GERD 12/21/2009  . HYPERCHOLESTEROLEMIA 10/09/2006  . Quit smoking 10/09/2006  . GLAUCOMA 10/09/2006  . Essential hypertension 10/09/2006  . 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 OSTEOARTHRITIS 10/09/2006  . COLONIC POLYPS, HX OF 10/09/2006   Past Medical History:  Diagnosis Date  . Adenomatous colon polyp 05/1985  . Allergy   . Aortic stenosis   . Cataract   . Cholelithiasis   . Diverticulosis   . GERD (gastroesophageal reflux disease)   .  Headache    migraines prior to hysterectomy  . Heart murmur   . Hyperlipidemia   . Hypertension   . Osteopenia   . PONV (postoperative nausea and vomiting)    nausea  . Shingles     Family History  Problem Relation Age of Onset  . Cancer Mother        lung  . Stroke Mother   . Cancer Father        pancreatic  . Pancreatic cancer Father   . Heart disease Sister   . Hypertension Brother   . Cancer Brother   . Diabetes Maternal Grandmother   . Hypertension Brother   . Obesity Sister   . Lung cancer Brother   . Breast cancer Neg Hx   . Colon cancer Neg Hx   . Esophageal cancer Neg Hx     . Rectal cancer Neg Hx   . Stomach cancer Neg Hx     Past Surgical History:  Procedure Laterality Date  . ABDOMINAL HYSTERECTOMY    . AORTIC VALVE REPLACEMENT N/A 08/16/2015   Procedure: AORTIC VALVE REPLACEMENT (AVR);  Surgeon: Ivin Poot, MD;  Location: [facility];  Service: Open Heart Surgery;  Laterality: N/A;  . APPENDECTOMY     taken out with hysterectomy  . BREAST BIOPSY Right 1990   EXCISIONAL - NEG  . CARDIAC CATHETERIZATION Bilateral 08/04/2015   Procedure: Right/Left Heart Cath and Coronary Angiography;  Surgeon: Wellington Hampshire, MD;  Location: [facility];  Service: Cardiovascular;  Laterality: Bilateral;  . ERCP N/A 10/31/2016   Procedure: ENDOSCOPIC RETROGRADE CHOLANGIOPANCREATOGRAPHY (ERCP);  Surgeon: Ladene Artist, MD;  Location: [facility] ENDOSCOPY;  Service: Endoscopy;  Laterality: N/A;  . EYE SURGERY N/A    detatched retina- "not sure which eye"  . KNEE SURGERY  10/2005   calcinosis - in office procedure  . TEE WITHOUT CARDIOVERSION N/A 08/16/2015   Procedure: TRANSESOPHAGEAL ECHOCARDIOGRAM (TEE);  Surgeon: Ivin Poot, MD;  Location: [facility];  Service: Open Heart Surgery;  Laterality: N/A;   Social History   Occupational History  . Occupation: Pharmacist, hospital  Tobacco Use  . Smoking status: Former Smoker    Years: 25.00    Last attempt to quit: 07/07/2002    Years since quitting: 15.8  . Smokeless tobacco: Never Used  . Tobacco comment: light smoker before quitting   Substance and Sexual Activity  . Alcohol use: No    Alcohol/week: 0.0 standard drinks  . Drug use: No  . Sexual activity: Never

## 2018-05-30 ENCOUNTER — Other Ambulatory Visit: Payer: Self-pay | Admitting: Family Medicine

## 2018-06-10 ENCOUNTER — Ambulatory Visit (INDEPENDENT_AMBULATORY_CARE_PROVIDER_SITE_OTHER): Payer: Medicare Other | Admitting: Orthopaedic Surgery

## 2018-07-01 ENCOUNTER — Ambulatory Visit: Payer: Medicare Other

## 2018-07-08 ENCOUNTER — Encounter: Payer: Medicare Other | Admitting: Family Medicine

## 2018-08-08 ENCOUNTER — Other Ambulatory Visit: Payer: Self-pay | Admitting: Family Medicine

## 2018-08-13 ENCOUNTER — Other Ambulatory Visit: Payer: Self-pay

## 2018-08-13 ENCOUNTER — Encounter: Payer: Self-pay | Admitting: Family Medicine

## 2018-08-13 ENCOUNTER — Ambulatory Visit (INDEPENDENT_AMBULATORY_CARE_PROVIDER_SITE_OTHER): Payer: Medicare Other | Admitting: Family Medicine

## 2018-08-13 VITALS — BP 142/80 | HR 96 | Ht 68.0 in | Wt 165.1 lb

## 2018-08-13 DIAGNOSIS — L0232 Furuncle of buttock: Secondary | ICD-10-CM | POA: Diagnosis not present

## 2018-08-13 MED ORDER — CEPHALEXIN 500 MG PO CAPS: 500.0000 mg | ORAL_CAPSULE | Freq: Three times a day (TID) | ORAL | 0 refills | Status: DC

## 2018-08-13 NOTE — Patient Instructions (Signed)
You have a draining boil with several openings  Keep clean with soap and water frequently  Warm compress 10 minutes at a time/sitz bath is also ok   Take the keflex as directed  Encourage drainage  If worse/increased redness /swelling please call  Also if you run a fever or feel bad  Keep an eye on this with a mirror   Update if not starting to improve in a week or if worsening

## 2018-08-13 NOTE — Progress Notes (Signed)
Subjective:    Patient ID: Heidi Middleton, female    DOB: Aug 06, 1945, 73 y.o.   MRN: [id number]  HPI Pt is here for a boil on her perineal area   1 1/2 weeks ago a cyst erupted  It stayed very sore  Yesterday she had more pain -and it drained lots of pus  Wearing mini pad   Thinks she may have more than one cyst  On the L side just anterior to rectum but not on labia   Uncomfortable to sit-improved today compared to yesterday  No fever  She has had a hysterectomy in the past   Patient Active Problem List   Diagnosis Date Noted  . Toe dislocation, left, subsequent encounter 05/14/2018  . Post-viral cough syndrome 04/24/2018  . Elevated glucose level 06/19/2017  . Colitis 09/25/2016  . Estrogen deficiency 06/26/2016  . Aortic valve stenosis   . Routine general medical examination at a health care facility 06/24/2015  . Heart murmur, systolic 38/93/7342  . Need for hepatitis C screening test 06/20/2015  . Medicare annual wellness visit, subsequent 06/20/2015  . Encounter for Medicare annual wellness exam 06/13/2012  . Osteopenia 01/31/2010  . GERD 12/21/2009  . HYPERCHOLESTEROLEMIA 10/09/2006  . Quit smoking 10/09/2006  . GLAUCOMA 10/09/2006  . Essential hypertension 10/09/2006  . 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 OSTEOARTHRITIS 10/09/2006  . COLONIC POLYPS, HX OF 10/09/2006   Past Medical History:  Diagnosis Date  . Adenomatous colon polyp 05/1985  . Allergy   . Aortic stenosis   . Cataract   . Cholelithiasis   . Diverticulosis   . GERD (gastroesophageal reflux disease)   . Headache    migraines prior to hysterectomy  . Heart murmur   . Hyperlipidemia   . Hypertension   . Osteopenia   . PONV (postoperative nausea and vomiting)    nausea  . Shingles    Past Surgical History:  Procedure Laterality Date  . ABDOMINAL HYSTERECTOMY    . AORTIC VALVE REPLACEMENT N/A 08/16/2015   Procedure: AORTIC VALVE REPLACEMENT (AVR);  Surgeon: Ivin Poot, MD;   Location: [facility];  Service: Open Heart Surgery;  Laterality: N/A;  . APPENDECTOMY     taken out with hysterectomy  . BREAST BIOPSY Right 1990   EXCISIONAL - NEG  . CARDIAC CATHETERIZATION Bilateral 08/04/2015   Procedure: Right/Left Heart Cath and Coronary Angiography;  Surgeon: Wellington Hampshire, MD;  Location: [facility];  Service: Cardiovascular;  Laterality: Bilateral;  . ERCP N/A 10/31/2016   Procedure: ENDOSCOPIC RETROGRADE CHOLANGIOPANCREATOGRAPHY (ERCP);  Surgeon: Ladene Artist, MD;  Location: [facility] ENDOSCOPY;  Service: Endoscopy;  Laterality: N/A;  . EYE SURGERY N/A    detatched retina- "not sure which eye"  . KNEE SURGERY  10/2005   calcinosis - in office procedure  . TEE WITHOUT CARDIOVERSION N/A 08/16/2015   Procedure: TRANSESOPHAGEAL ECHOCARDIOGRAM (TEE);  Surgeon: Ivin Poot, MD;  Location: [facility];  Service: Open Heart Surgery;  Laterality: N/A;   Social History   Tobacco Use  . Smoking status: Former Smoker    Years: 25.00    Last attempt to quit: 07/07/2002    Years since quitting: 16.1  . Smokeless tobacco: Never Used  . Tobacco comment: light smoker before quitting   Substance Use Topics  . Alcohol use: No    Alcohol/week: 0.0 standard drinks  . Drug use: No   Family History  Problem Relation Age of Onset  . Cancer Mother  lung  . Stroke Mother   . Cancer Father        pancreatic  . Pancreatic cancer Father   . Heart disease Sister   . Hypertension Brother   . Cancer Brother   . Diabetes Maternal Grandmother   . Hypertension Brother   . Obesity Sister   . Lung cancer Brother   . Breast cancer Neg Hx   . Colon cancer Neg Hx   . Esophageal cancer Neg Hx   . Rectal cancer Neg Hx   . Stomach cancer Neg Hx    Allergies  Allergen Reactions  . Codeine Nausea And Vomiting   Current Outpatient Medications on File Prior to Visit  Medication Sig Dispense Refill  . acetaminophen (TYLENOL) 325 MG tablet Take 650 mg by mouth every 6 (six)  hours as needed for mild pain, moderate pain, fever or headache.    Marland Kitchen aspirin EC 325 MG tablet Take 325 mg by mouth daily.    Marland Kitchen atorvastatin (LIPITOR) 10 MG tablet TAKE 1/2 TABLET BY MOUTH DAILY 45 tablet 0  . Calcium Polycarbophil (FIBER LAXATIVE PO) Take 2 tablets by mouth 2 (two) times a day.    . cholecalciferol (VITAMIN D) 1000 UNITS tablet Take 1,000 Units by mouth 2 (two) times daily.     Marland Kitchen esomeprazole (NEXIUM) 40 MG capsule TAKE 1 CAPSULE BY MOUTH EVERY DAY BEFORE BREAKFAST 90 capsule 0  . fluticasone (FLONASE) 50 MCG/ACT nasal spray Place 1-2 sprays into both nostrils daily as needed for rhinitis.    Marland Kitchen lisinopril (PRINIVIL,ZESTRIL) 5 MG tablet TAKE 1 TABLET BY MOUTH EVERY DAY 90 tablet 2  . metoprolol tartrate (LOPRESSOR) 25 MG tablet TAKE 0.5 TABLETS (12.5 MG TOTAL) BY MOUTH 2 (TWO) TIMES DAILY. 90 tablet 3  . Multiple Vitamin (MULTIVITAMIN WITH MINERALS) TABS tablet Take 1 tablet by mouth daily.    Marland Kitchen omega-3 acid ethyl esters (LOVAZA) 1 g capsule Take 1 g by mouth 2 (two) times daily.     No current facility-administered medications on file prior to visit.     Review of Systems  Constitutional: Negative for activity change, appetite change, fatigue, fever and unexpected weight change.  HENT: Negative for congestion, ear pain, rhinorrhea, sinus pressure and sore throat.   Eyes: Negative for pain, redness and visual disturbance.  Respiratory: Negative for cough, shortness of breath and wheezing.   Cardiovascular: Negative for chest pain and palpitations.  Gastrointestinal: Negative for abdominal pain, blood in stool, constipation and diarrhea.  Endocrine: Negative for polydipsia and polyuria.  Genitourinary: Negative for dysuria, frequency and urgency.  Musculoskeletal: Negative for arthralgias, back pain and myalgias.  Skin: Positive for wound. Negative for pallor and rash.       Boil-that burst  Allergic/Immunologic: Negative for environmental allergies.  Neurological: Negative  for dizziness, syncope and headaches.  Hematological: Negative for adenopathy. Does not bruise/bleed easily.  Psychiatric/Behavioral: Negative for decreased concentration and dysphoric mood. The patient is not nervous/anxious.        Objective:   Physical Exam Constitutional:      Appearance: Normal appearance. She is normal weight.  HENT:     Mouth/Throat:     Mouth: Mucous membranes are moist.     Pharynx: Oropharynx is clear.  Eyes:     Extraocular Movements: Extraocular movements intact.     Conjunctiva/sclera: Conjunctivae normal.     Pupils: Pupils are equal, round, and reactive to light.  Cardiovascular:     Rate and Rhythm: Regular rhythm. Tachycardia present.  Pulmonary:     Effort: Pulmonary effort is normal. No respiratory distress.     Breath sounds: No wheezing.  Abdominal:     General: Abdomen is flat.     Palpations: Abdomen is soft.  Genitourinary:    Comments: Boil noted -(L buttock just anterior to rectum) about 1.5 cm in diameter with 2 openings both draining pus material (easily expressed)  Area of induration is soft and also tender to the touch  No streaking  No firm areas  Skin:    General: Skin is warm.     Findings: No rash.  Neurological:     Mental Status: She is alert.  Psychiatric:        Mood and Affect: Mood normal.           Assessment & Plan:   Problem List Items Addressed This Visit      Musculoskeletal and Integument   Boil of buttock - Primary    This appears to be a subcutaneous perianal abscess that has already drained  L side just anterior to rectum  Looks like a loculated area with 2 openings (both draining pus) , or 2 cysts together  Able to express more material  Disc use of warm compresses Clean with soap and water  Encourage drainage /use gauze or pads if needed Px keflex to take tid for 7d  Update if not starting to improve in a week or if worsening  -esp if worse pain or swelling or any fever      Relevant  Medications   cephALEXin (KEFLEX) 500 MG capsule

## 2018-08-13 NOTE — Assessment & Plan Note (Addendum)
This appears to be a subcutaneous perianal abscess that has already drained  L side just anterior to rectum  Looks like a loculated area with 2 openings (both draining pus) , or 2 cysts together  Able to express more material  Disc use of warm compresses Clean with soap and water  Encourage drainage /use gauze or pads if needed Px keflex to take tid for 7d  Update if not starting to improve in a week or if worsening  -esp if worse pain or swelling or any fever

## 2018-08-18 ENCOUNTER — Ambulatory Visit
Admission: RE | Admit: 2018-08-18 | Discharge: 2018-08-18 | Disposition: A | Discharge status: Home / Self Care | Payer: Medicare Other | Source: Ambulatory Visit | Attending: Family Medicine | Admitting: Family Medicine

## 2018-08-18 ENCOUNTER — Other Ambulatory Visit: Payer: Self-pay

## 2018-08-18 DIAGNOSIS — Z1231 Encounter for screening mammogram for malignant neoplasm of breast: Secondary | ICD-10-CM | POA: Diagnosis present

## 2018-10-08 ENCOUNTER — Other Ambulatory Visit: Payer: Self-pay | Admitting: Cardiovascular Disease

## 2018-10-29 ENCOUNTER — Telehealth: Payer: Self-pay | Admitting: Cardiovascular Disease

## 2018-10-29 NOTE — Telephone Encounter (Signed)
Returned call to patient.   She reports steady increase in blood pressure. SOB at baseline, she has 1/2 mile walk to mailbox and has to stop halfway to rest. She does endorse some weakness  Denies chest pain, headache, vision changes. Pt reports she has been compliant with all medications. Takes lisinopril and metoprolol.   She had yearly scheduled with Dr. Rockey Situ next month. I went ahead and moved it up to next Monday 8/17 @ 10:40A.   Advised pt I will reach out to MD and will call back if there are any changes needed.   Advised pt to call for any further questions or concerns.

## 2018-10-29 NOTE — Telephone Encounter (Signed)
Pt c/o BP issue: STAT if pt c/o blurred vision, one-sided weakness or slurred speech  1. What are your last 5 BP readings?   8/12 145/77  8/11 161/78  183/82  181/87  8/10 156/75  165/82  146/74  2. Are you having any other symptoms (ex. Dizziness, headache, blurred vision, passed out)? occassionaly weak but patient feels that is due to lack of activity due to pandemic  3. What is your BP issue? Elevated BP

## 2018-11-02 ENCOUNTER — Other Ambulatory Visit: Payer: Self-pay | Admitting: Family Medicine

## 2018-11-02 NOTE — Progress Notes (Signed)
[facility] Office Note  Date:  11/03/2018   ID:  Heidi Middleton, DOB 1946/01/31, MRN [id number]  PCP:  Abner Greenspan, MD   Chief Complaint  Patient presents with  . other    SOB , elevated Bp. no CP feet and legs swell at times.Medications reviewed verbally.    HPI:  Heidi Middleton is a 73 y.o. female with PMH of s/p AVR 08/16/2015 bovine previous smoker Does exercise 4 days a week  presents for follow-up of her aortic valve stenosis, s/p AVR, HTN  BP elevated 170 consistently Very anxious about the way she feels, high pressure Etiology of the high blood pressure unclear, previously was well controlled but feels it has been elevated for several months now  Blood pressure readings reviewed from her blood pressure cuff which she brings in today all typically 657-846 systolic  Tired, not walking as much Misses the gym  Patient denies chest pain, PND, orthopnea, edema. No syncope Weight stable Denies chest pain or shortness of breath  No recent echocardiogram available Last echocardiogram April 2018 normal ejection fraction Normal-appearing prosthetic valve   EKG personally reviewed by myself on todays visit Shows normal sinus rhythm rate 88 bpm no significant ST or T-wave changes   Other past medical history reviewed [facility] 08/04/2015:  Prox RCA to Mid RCA lesion, 30% stenosed.  1. Mild nonobstructive coronary artery disease. 2. Severe aortic valve stenosis with a mean gradient of 51.6 mmHg with a valve area of 0.67. 3. Right heart catheterization showed normal pulmonary pressure, normal cardiac output and mildly elevated pulmonary wedge pressure.  Echo 10/05/2015: Left ventricle: The cavity size was normal. Systolic function was normal. The estimated ejection fraction was in the range of 60% to 65%. Wall motion was normal; there were no regional wall motion abnormalities. Doppler parameters are consistent with abnormal left ventricular relaxation (grade 1  diastolic dysfunction). - Aortic valve: A bioprosthesis was present. Transvalvular velocity was within the normal range. There was no stenosis. Peak gradient (S): 26 mm Hg.   Echo 06/28/2016 unchanged  PMH:   has a past medical history of Adenomatous colon polyp (05/1985), Allergy, Aortic stenosis, Cataract, Cholelithiasis, Diverticulosis, GERD (gastroesophageal reflux disease), Headache, Heart murmur, Hyperlipidemia, Hypertension, Osteopenia, PONV (postoperative nausea and vomiting), and Shingles.  PSH:    Past Surgical History:  Procedure Laterality Date  . ABDOMINAL HYSTERECTOMY    . AORTIC VALVE REPLACEMENT N/A 08/16/2015   Procedure: AORTIC VALVE REPLACEMENT (AVR);  Surgeon: Ivin Poot, MD;  Location: [place];  Service: Open Heart Surgery;  Laterality: N/A;  . APPENDECTOMY     taken out with hysterectomy  . BREAST EXCISIONAL BIOPSY Right 1990   EXCISIONAL - NEG  . CARDIAC CATHETERIZATION Bilateral 08/04/2015   Procedure: Right/Left Heart Cath and Coronary Angiography;  Surgeon: Wellington Hampshire, MD;  Location: [place] [facility];  Service: Cardiovascular;  Laterality: Bilateral;  . ERCP N/A 10/31/2016   Procedure: ENDOSCOPIC RETROGRADE CHOLANGIOPANCREATOGRAPHY (ERCP);  Surgeon: Ladene Artist, MD;  Location: [facility] ENDOSCOPY;  Service: Endoscopy;  Laterality: N/A;  . EYE SURGERY N/A    detatched retina- "not sure which eye"  . KNEE SURGERY  10/2005   calcinosis - in office procedure  . TEE WITHOUT CARDIOVERSION N/A 08/16/2015   Procedure: TRANSESOPHAGEAL ECHOCARDIOGRAM (TEE);  Surgeon: Ivin Poot, MD;  Location: [place];  Service: Open Heart Surgery;  Laterality: N/A;    Current Outpatient Medications  Medication Sig Dispense Refill  . acetaminophen (TYLENOL) 325 MG  tablet Take 650 mg by mouth every 6 (six) hours as needed for mild pain, moderate pain, fever or headache.    Marland Kitchen aspirin EC 325 MG tablet Take 325 mg by mouth daily.    Marland Kitchen atorvastatin (LIPITOR) 10 MG  tablet TAKE 1/2 TABLET BY MOUTH DAILY 45 tablet 0  . Calcium Polycarbophil (FIBER LAXATIVE PO) Take 2 tablets by mouth 2 (two) times a day.    . cholecalciferol (VITAMIN D) 1000 UNITS tablet Take 1,000 Units by mouth 2 (two) times daily.     Marland Kitchen esomeprazole (NEXIUM) 40 MG capsule TAKE 1 CAPSULE BY MOUTH EVERY DAY BEFORE BREAKFAST 90 capsule 0  . fluticasone (FLONASE) 50 MCG/ACT nasal spray Place 1-2 sprays into both nostrils daily as needed for rhinitis.    Marland Kitchen lisinopril (ZESTRIL) 5 MG tablet TAKE 1 TABLET BY MOUTH EVERY DAY 90 tablet 1  . metoprolol tartrate (LOPRESSOR) 25 MG tablet TAKE 0.5 TABLETS (12.5 MG TOTAL) BY MOUTH 2 (TWO) TIMES DAILY. 90 tablet 3  . Multiple Vitamin (MULTIVITAMIN WITH MINERALS) TABS tablet Take 1 tablet by mouth daily.    Marland Kitchen omega-3 acid ethyl esters (LOVAZA) 1 g capsule Take 1 g by mouth 2 (two) times daily.     No current facility-administered medications for this visit.      Allergies:   Codeine   Social History:  The patient  reports that she quit smoking about 16 years ago. She quit after 25.00 years of use. She has never used smokeless tobacco. She reports that she does not drink alcohol or use drugs.   Family History:   family history includes Cancer in her brother, father, and mother; Diabetes in her maternal grandmother; Heart disease in her sister; Hypertension in her brother and brother; Lung cancer in her brother; Obesity in her sister; Pancreatic cancer in her father; Stroke in her mother.    Review of Systems: Review of Systems  Constitutional: Negative.   Respiratory: Negative.   Cardiovascular: Negative.   Gastrointestinal: Negative.   Musculoskeletal: Negative.   Neurological: Negative.   Psychiatric/Behavioral: Negative.   All other systems reviewed and are negative.    PHYSICAL EXAM: VS:  BP (!) 174/88 (BP Location: Left Arm, Patient Position: Sitting, Cuff Size: Normal)   Pulse 69   Ht 5\' 8"  (1.727 m)   Wt 166 lb 6.4 oz (75.5 kg)    SpO2 98%   BMI 25.30 kg/m  , BMI Body mass index is 25.3 kg/m. Constitutional:  oriented to person, place, and time. No distress.  HENT:  Head: Normocephalic and atraumatic.  Eyes:  no discharge. No scleral icterus.  Neck: Normal range of motion. Neck supple. No JVD present.  Cardiovascular: Normal rate, regular rhythm, normal heart sounds and intact distal pulses. Exam reveals no gallop and no friction rub. No edema No murmur heard. Pulmonary/Chest: Effort normal and breath sounds normal. No stridor. No respiratory distress.  no wheezes.  no rales.  no tenderness.  Abdominal: Soft.  no distension.  no tenderness.  Musculoskeletal: Normal range of motion.  no  tenderness or deformity.  Neurological:  normal muscle tone. Coordination normal. No atrophy Skin: Skin is warm and dry. No rash noted. not diaphoretic.  Psychiatric:  normal mood and affect. behavior is normal. Thought content normal.    Recent Labs: No results found for requested labs within last 8760 hours.    Lipid Panel Lab Results  Component Value Date   CHOL 167 06/20/2017   HDL 74.30 06/20/2017  LDLCALC 77 06/20/2017   TRIG 80.0 06/20/2017      Wt Readings from Last 3 Encounters:  11/03/18 166 lb 6.4 oz (75.5 kg)  08/13/18 165 lb 1 oz (74.9 kg)  05/27/18 166 lb (75.3 kg)     ASSESSMENT AND PLAN:  Nonrheumatic aortic valve stenosis - Plan: EKG 12-Lead echo in 2018 Echo ordered to evaluate valve Recommend she decrease aspirin down to 81 mg daily  Essential hypertension Running high New prescriptions as below Metoprolol up to 25 BID, lisinopril up to 20 daily She will monitor blood pressure numbers and call us in the next 2 weeks  HYPERCHOLESTEROLEMIA Total cholesterol 167, improved Weight stable Suggested going back to gym when open  Choledocholithiasis Stone extraction,  Prior colitis No recent issues  Disposition:   F/U  6 months   Total encounter time more than 25 minutes  Greater than  50% was spent in counseling and coordination of care with the patient    Orders Placed This Encounter  Procedures  . EKG 12-Lead     Signed, Esmond Plants, M.D., Ph.D. 11/03/2018  [place], [place]

## 2018-11-03 ENCOUNTER — Encounter: Payer: Self-pay | Admitting: Cardiovascular Disease

## 2018-11-03 ENCOUNTER — Ambulatory Visit (INDEPENDENT_AMBULATORY_CARE_PROVIDER_SITE_OTHER): Payer: Medicare Other | Admitting: Cardiovascular Disease

## 2018-11-03 ENCOUNTER — Other Ambulatory Visit: Payer: Self-pay

## 2018-11-03 VITALS — BP 174/88 | HR 69 | Ht 68.0 in | Wt 166.4 lb

## 2018-11-03 DIAGNOSIS — Z952 Presence of prosthetic heart valve: Secondary | ICD-10-CM | POA: Diagnosis not present

## 2018-11-03 DIAGNOSIS — I10 Essential (primary) hypertension: Secondary | ICD-10-CM

## 2018-11-03 DIAGNOSIS — E7800 Pure hypercholesterolemia, unspecified: Secondary | ICD-10-CM | POA: Diagnosis not present

## 2018-11-03 DIAGNOSIS — I350 Nonrheumatic aortic (valve) stenosis: Secondary | ICD-10-CM

## 2018-11-03 MED ORDER — LISINOPRIL 20 MG PO TABS: 20.0000 mg | ORAL_TABLET | Freq: Every day | ORAL | 3 refills | Status: DC

## 2018-11-03 MED ORDER — METOPROLOL TARTRATE 25 MG PO TABS: 25.0000 mg | ORAL_TABLET | Freq: Two times a day (BID) | ORAL | 3 refills | Status: DC

## 2018-11-03 MED ORDER — ASPIRIN EC 81 MG PO TBEC: 81.0000 mg | DELAYED_RELEASE_TABLET | Freq: Every day | ORAL | 3 refills | Status: AC

## 2018-11-03 NOTE — Patient Instructions (Addendum)
Medication Instructions:  Your physician has recommended you make the following change in your medication:  1. INCREASE Metoprolol tartrate to 25 mg twice a day 2. INCREASE Lisinopril to 20 mg daily 3. DECREASE Aspirin to 81 mg once daily  If you need a refill on your cardiac medications before your next appointment, please call your pharmacy.    Lab work: No new labs needed   If you have labs (blood work) drawn today and your tests are completely normal, you will receive your results only by: Marland Kitchen MyChart Message (if you have MyChart) OR . A paper copy in the mail If you have any lab test that is abnormal or we need to change your treatment, we will call you to review the results.   Testing/Procedures: Echo for s/p AVR Your physician has requested that you have an echocardiogram. Echocardiography is a painless test that uses sound waves to create images of your heart. It provides your doctor with information about the size and shape of your heart and how well your heart's chambers and valves are working. This procedure takes approximately one hour. There are no restrictions for this procedure.   Follow-Up: At [facility], you and your health needs are our priority.  As part of our continuing mission to provide you with exceptional heart care, we have created designated Provider Care Teams.  These Care Teams include your primary Cardiologist (physician) and Advanced Practice Providers (APPs -  Physician Assistants and Nurse Practitioners) who all work together to provide you with the care you need, when you need it.  . You will need a follow up appointment in 6 months .   Please call our office 2 months in advance to schedule this appointment.    . Providers on your designated Care Team:   . Murray Hodgkins, NP . Christell Faith, PA-C . Marrianne Mood, PA-C  Any Other Special Instructions Will Be Listed Below (If Applicable).  For educational health videos Log in to :  [email or web address] Or : [email or web address], password : [email or web address]   Echocardiogram An echocardiogram is a procedure that uses painless sound waves (ultrasound) to produce an image of the heart. Images from an echocardiogram can provide important information about:  Signs of coronary artery disease (CAD).  Aneurysm detection. An aneurysm is a weak or damaged part of an artery wall that bulges out from the normal force of blood pumping through the body.  Heart size and shape. Changes in the size or shape of the heart can be associated with certain conditions, including heart failure, aneurysm, and CAD.  Heart muscle function.  Heart valve function.  Signs of a past heart attack.  Fluid buildup around the heart.  Thickening of the heart muscle.  A tumor or infectious growth around the heart valves. Tell a health care provider about:  Any allergies you have.  All medicines you are taking, including vitamins, herbs, eye drops, creams, and over-the-counter medicines.  Any blood disorders you have.  Any surgeries you have had.  Any medical conditions you have.  Whether you are pregnant or may be pregnant. What are the risks? Generally, this is a safe procedure. However, problems may occur, including:  Allergic reaction to dye (contrast) that may be used during the procedure. What happens before the procedure? No specific preparation is needed. You may eat and drink normally. What happens during the procedure?   An IV tube may be inserted into one of your veins.  You may receive contrast through this tube.  A contrast is an injection that improves the quality of the pictures from your heart.  A gel will be applied to your chest.  A wand-like tool (transducer) will be moved over your chest. The gel will help to transmit the sound waves from the transducer.  The sound waves will harmlessly bounce off of your heart to allow the heart images to be captured in real-time motion. The images  will be recorded on a computer. The procedure may vary among health care providers and hospitals. What happens after the procedure?  You may return to your normal, everyday life, including diet, activities, and medicines, unless your health care provider tells you not to do that. Summary  An echocardiogram is a procedure that uses painless sound waves (ultrasound) to produce an image of the heart.  Images from an echocardiogram can provide important information about the size and shape of your heart, heart muscle function, heart valve function, and fluid buildup around your heart.  You do not need to do anything to prepare before this procedure. You may eat and drink normally.  After the echocardiogram is completed, you may return to your normal, everyday life, unless your health care provider tells you not to do that. This information is not intended to replace advice given to you by your health care provider. Make sure you discuss any questions you have with your health care provider. Document Released: 03/02/2000 Document Revised: 06/26/2018 Document Reviewed: 04/07/2016 Elsevier Patient Education  2020 [employer].  How to Take Your Blood Pressure You can take your blood pressure at home with a machine. You may need to check your blood pressure at home:  To check if you have high blood pressure (hypertension).  To check your blood pressure over time.  To make sure your blood pressure medicine is working. Supplies needed: You will need a blood pressure machine, or monitor. You can buy one at a drugstore or online. When choosing one:  Choose one with an arm cuff.  Choose one that wraps around your upper arm. Only one finger should fit between your arm and the cuff.  Do not choose one that measures your blood pressure from your wrist or finger. Your doctor can suggest a monitor. How to prepare Avoid these things for 30 minutes before checking your blood pressure:  Drinking  caffeine.  Drinking alcohol.  Eating.  Smoking.  Exercising. Five minutes before checking your blood pressure:  Pee.  Sit in a dining chair. Avoid sitting in a soft couch or armchair.  Be quiet. Do not talk. How to take your blood pressure Follow the instructions that came with your machine. If you have a digital blood pressure monitor, these may be the instructions: 1. Sit up straight. 2. Place your feet on the floor. Do not cross your ankles or legs. 3. Rest your left arm at the level of your heart. You may rest it on a table, desk, or chair. 4. Pull up your shirt sleeve. 5. Wrap the blood pressure cuff around the upper part of your left arm. The cuff should be 1 inch (2.5 cm) above your elbow. It is best to wrap the cuff around bare skin. 6. Fit the cuff snugly around your arm. You should be able to place only one finger between the cuff and your arm. 7. Put the cord inside the groove of your elbow. 8. Press the power button. 9. Sit quietly while the cuff fills with air and loses air. 10. Write down the  numbers on the screen. 11. Wait 2-3 minutes and then repeat steps 1-10. What do the numbers mean? Two numbers make up your blood pressure. The first number is called systolic pressure. The second is called diastolic pressure. An example of a blood pressure reading is "120 over 80" (or 120/80). If you are an adult and do not have a medical condition, use this guide to find out if your blood pressure is normal: Normal  First number: below 120.  Second number: below 80. Elevated  First number: 120-129.  Second number: below 80. Hypertension stage 1  First number: 130-139.  Second number: 80-89. Hypertension stage 2  First number: 140 or above.  Second number: 37 or above. Your blood pressure is above normal even if only the top or bottom number is above normal. Follow these instructions at home:  Check your blood pressure as often as your doctor tells you  to.  Take your monitor to your next doctor's appointment. Your doctor will: ? Make sure you are using it correctly. ? Make sure it is working right.  Make sure you understand what your blood pressure numbers should be.  Tell your doctor if your medicines are causing side effects. Contact a doctor if:  Your blood pressure keeps being high. Get help right away if:  Your first blood pressure number is higher than 180.  Your second blood pressure number is higher than 120. This information is not intended to replace advice given to you by your health care provider. Make sure you discuss any questions you have with your health care provider. Document Released: 02/16/2008 Document Revised: 02/15/2017 Document Reviewed: 08/12/2015 Elsevier Patient Education  2020 [employer].  Blood Pressure Record Sheet To take your blood pressure, you will need a blood pressure machine. You can buy a blood pressure machine (blood pressure monitor) at your clinic, drug store, or online. When choosing one, consider:  An automatic monitor that has an arm cuff.  A cuff that wraps snugly around your upper arm. You should be able to fit only one finger between your arm and the cuff.  A device that stores blood pressure reading results.  Do not choose a monitor that measures your blood pressure from your wrist or finger. Follow your health care provider's instructions for how to take your blood pressure. To use this form:  Get one reading in the morning (a.m.) before you take any medicines.  Get one reading in the evening (p.m.) before supper.  Take at least 2 readings with each blood pressure check. This makes sure the results are correct. Wait 1-2 minutes between measurements.  Write down the results in the spaces on this form.  Repeat this once a week, or as told by your health care provider.  Make a follow-up appointment with your health care provider to discuss the results. Blood pressure  log Date: _______________________  a.m. _____________________(1st reading) _____________________(2nd reading)  p.m. _____________________(1st reading) _____________________(2nd reading) Date: _______________________  a.m. _____________________(1st reading) _____________________(2nd reading)  p.m. _____________________(1st reading) _____________________(2nd reading) Date: _______________________  a.m. _____________________(1st reading) _____________________(2nd reading)  p.m. _____________________(1st reading) _____________________(2nd reading) Date: _______________________  a.m. _____________________(1st reading) _____________________(2nd reading)  p.m. _____________________(1st reading) _____________________(2nd reading) Date: _______________________  a.m. _____________________(1st reading) _____________________(2nd reading)  p.m. _____________________(1st reading) _____________________(2nd reading) This information is not intended to replace advice given to you by your health care provider. Make sure you discuss any questions you have with your health care provider. Document Released: 12/02/2002 Document Revised: 05/03/2017 Document Reviewed: 03/05/2017  Elsevier Patient Education  [employer].

## 2018-11-19 ENCOUNTER — Other Ambulatory Visit: Payer: Medicare Other

## 2018-11-25 ENCOUNTER — Ambulatory Visit (INDEPENDENT_AMBULATORY_CARE_PROVIDER_SITE_OTHER): Payer: Medicare Other

## 2018-11-25 ENCOUNTER — Ambulatory Visit: Payer: Medicare Other

## 2018-11-25 VITALS — Ht 68.0 in | Wt 167.0 lb

## 2018-11-25 DIAGNOSIS — Z0000 Encounter for general adult medical examination without abnormal findings: Secondary | ICD-10-CM | POA: Diagnosis not present

## 2018-11-25 NOTE — Progress Notes (Signed)
PCP notes:  Health Maintenance:  Flu vaccine due.  Abnormal Screenings:  Depression scree: 15; states due to covid 19  Patient concerns:  None  Nurse concerns:  None  Next PCP appt.: 12/02/2018 at 12:00

## 2018-11-25 NOTE — Patient Instructions (Signed)
Heidi Middleton , Thank you for taking time to come for your Medicare Wellness Visit. I appreciate your ongoing commitment to your health goals. Please review the following plan we discussed and let me know if I can assist you in the future.   Screening recommendations/referrals: Colonoscopy: 11/2017 Mammogram: 08/2018 Bone Density: 11/2016 Recommended yearly ophthalmology/optometry visit for glaucoma screening and checkup Recommended yearly dental visit for hygiene and checkup  Vaccinations: Influenza vaccine: due Pneumococcal vaccine: 06/2015 Tdap vaccine: 06/2014 Shingles vaccine: discussed    Advanced directives: Please bring a copy of your POA (Power of [place]) and/or Living Will to your next appointment.    Conditions/risks identified: overweight  Next appointment: 12/02/2018 at 12:00   Preventive Care 74 Years and Older, Female Preventive care refers to lifestyle choices and visits with your health care provider that can promote health and wellness. What does preventive care include?  A yearly physical exam. This is also called an annual well check.  Dental exams once or twice a year.  Routine eye exams. Ask your health care provider how often you should have your eyes checked.  Personal lifestyle choices, including:  Daily care of your teeth and gums.  Regular physical activity.  Eating a healthy diet.  Avoiding tobacco and drug use.  Limiting alcohol use.  Practicing safe sex.  Taking low-dose aspirin every day.  Taking vitamin and mineral supplements as recommended by your health care provider. What happens during an annual well check? The services and screenings done by your health care provider during your annual well check will depend on your age, overall health, lifestyle risk factors, and family history of disease. Counseling  Your health care provider may ask you questions about your:  Alcohol use.  Tobacco use.  Drug use.  Emotional well-being.   Home and relationship well-being.  Sexual activity.  Eating habits.  History of falls.  Memory and ability to understand (cognition).  Work and work Statistician.  Reproductive health. Screening  You may have the following tests or measurements:  Height, weight, and BMI.  Blood pressure.  Lipid and cholesterol levels. These may be checked every 5 years, or more frequently if you are over 74 years old.  Skin check.  Lung cancer screening. You may have this screening every year starting at age 74 if you have a 30-pack-year history of smoking and currently smoke or have quit within the past 15 years.  Fecal occult blood test (FOBT) of the stool. You may have this test every year starting at age 53.  Flexible sigmoidoscopy or colonoscopy. You may have a sigmoidoscopy every 5 years or a colonoscopy every 10 years starting at age 96.  Hepatitis C blood test.  Hepatitis B blood test.  Sexually transmitted disease (STD) testing.  Diabetes screening. This is done by checking your blood sugar (glucose) after you have not eaten for a while (fasting). You may have this done every 1-3 years.  Bone density scan. This is done to screen for osteoporosis. You may have this done starting at age 20.  Mammogram. This may be done every 1-2 years. Talk to your health care provider about how often you should have regular mammograms. Talk with your health care provider about your test results, treatment options, and if necessary, the need for more tests. Vaccines  Your health care provider may recommend certain vaccines, such as:  Influenza vaccine. This is recommended every year.  Tetanus, diphtheria, and acellular pertussis (Tdap, Td) vaccine. You may need a Td booster  every 10 years.  Zoster vaccine. You may need this after age 18.  Pneumococcal 13-valent conjugate (PCV13) vaccine. One dose is recommended after age 74.  Pneumococcal polysaccharide (PPSV23) vaccine. One dose is  recommended after age 15. Talk to your health care provider about which screenings and vaccines you need and how often you need them. This information is not intended to replace advice given to you by your health care provider. Make sure you discuss any questions you have with your health care provider. Document Released: 04/01/2015 Document Revised: 11/23/2015 Document Reviewed: 01/04/2015 Elsevier Interactive Patient Education  2017 [place] Prevention in the Home Falls can cause injuries. They can happen to people of all ages. There are many things you can do to make your home safe and to help prevent falls. What can I do on the outside of my home?  Regularly fix the edges of walkways and driveways and fix any cracks.  Remove anything that might make you trip as you walk through a door, such as a raised step or threshold.  Trim any bushes or trees on the path to your home.  Use bright outdoor lighting.  Clear any walking paths of anything that might make someone trip, such as rocks or tools.  Regularly check to see if handrails are loose or broken. Make sure that both sides of any steps have handrails.  Any raised decks and porches should have guardrails on the edges.  Have any leaves, snow, or ice cleared regularly.  Use sand or salt on walking paths during winter.  Clean up any spills in your garage right away. This includes oil or grease spills. What can I do in the bathroom?  Use night lights.  Install grab bars by the toilet and in the tub and shower. Do not use towel bars as grab bars.  Use non-skid mats or decals in the tub or shower.  If you need to sit down in the shower, use a plastic, non-slip stool.  Keep the floor dry. Clean up any water that spills on the floor as soon as it happens.  Remove soap buildup in the tub or shower regularly.  Attach bath mats securely with double-sided non-slip rug tape.  Do not have throw rugs and other things on  the floor that can make you trip. What can I do in the bedroom?  Use night lights.  Make sure that you have a light by your bed that is easy to reach.  Do not use any sheets or blankets that are too big for your bed. They should not hang down onto the floor.  Have a firm chair that has side arms. You can use this for support while you get dressed.  Do not have throw rugs and other things on the floor that can make you trip. What can I do in the kitchen?  Clean up any spills right away.  Avoid walking on wet floors.  Keep items that you use a lot in easy-to-reach places.  If you need to reach something above you, use a strong step stool that has a grab bar.  Keep electrical cords out of the way.  Do not use floor polish or wax that makes floors slippery. If you must use wax, use non-skid floor wax.  Do not have throw rugs and other things on the floor that can make you trip. What can I do with my stairs?  Do not leave any items on the stairs.  Make  sure that there are handrails on both sides of the stairs and use them. Fix handrails that are broken or loose. Make sure that handrails are as long as the stairways.  Check any carpeting to make sure that it is firmly attached to the stairs. Fix any carpet that is loose or worn.  Avoid having throw rugs at the top or bottom of the stairs. If you do have throw rugs, attach them to the floor with carpet tape.  Make sure that you have a light switch at the top of the stairs and the bottom of the stairs. If you do not have them, ask someone to add them for you. What else can I do to help prevent falls?  Wear shoes that:  Do not have high heels.  Have rubber bottoms.  Are comfortable and fit you well.  Are closed at the toe. Do not wear sandals.  If you use a stepladder:  Make sure that it is fully opened. Do not climb a closed stepladder.  Make sure that both sides of the stepladder are locked into place.  Ask someone to  hold it for you, if possible.  Clearly mark and make sure that you can see:  Any grab bars or handrails.  First and last steps.  Where the edge of each step is.  Use tools that help you move around (mobility aids) if they are needed. These include:  Canes.  Walkers.  Scooters.  Crutches.  Turn on the lights when you go into a dark area. Replace any light bulbs as soon as they burn out.  Set up your furniture so you have a clear path. Avoid moving your furniture around.  If any of your floors are uneven, fix them.  If there are any pets around you, be aware of where they are.  Review your medicines with your doctor. Some medicines can make you feel dizzy. This can increase your chance of falling. Ask your doctor what other things that you can do to help prevent falls. This information is not intended to replace advice given to you by your health care provider. Make sure you discuss any questions you have with your health care provider. Document Released: 12/30/2008 Document Revised: 08/11/2015 Document Reviewed: 04/09/2014 Elsevier Interactive Patient Education  2017 [employer].

## 2018-11-25 NOTE — Progress Notes (Signed)
Subjective:   Heidi Middleton is a 73 y.o. female who presents for Medicare Annual (Subsequent) preventive examination.  This visit type was conducted due to national recommendations for restrictions regarding the COVID-19 Pandemic (e.g. social distancing). This format is felt to be most appropriate for this patient at this time. All issues noted in this document were discussed and addressed. No physical exam was performed (except for noted visual exam findings with Video Visits). This patient, Heidi Middleton, has given permission to perform this visit via telephone. Vital signs may be absent or patient reported.  Patient location:  At home  Nurse location:  At home     Review of Systems:  n/a Cardiac Risk Factors include: advanced age (>79men, >11 women);hypertension     Objective:     Vitals: Ht 5\' 8"  (1.727 m) Comment: per patient  Wt 167 lb (75.8 kg) Comment: per patient  BMI 25.39 kg/m   Body mass index is 25.39 kg/m.  Advanced Directives 11/25/2018 05/12/2018 06/20/2017 10/31/2016 09/23/2016 06/19/2016 09/12/2015  Does Patient Have a Medical Advance Directive? Yes Yes Yes No No Yes Yes  Type of Paramedic of [place];Living will Living will [place];Living will Living will - [place];Living will [place];Living will  Does patient want to make changes to medical advance directive? - No - Patient declined - - - - No - Patient declined  Copy of [place] in Chart? No - copy requested - Yes - - Yes No - copy requested  Would patient like information on creating a medical advance directive? - No - Patient declined - No - Patient declined - - -    Tobacco Social History   Tobacco Use  Smoking Status Former Smoker  . Years: 25.00  . Quit date: 07/07/2002  . Years since quitting: 16.3  Smokeless Tobacco Never Used  Tobacco Comment   light smoker before quitting      Counseling  given: Not Answered Comment: light smoker before quitting    Clinical Intake:  Pre-visit preparation completed: Yes  Pain : No/denies pain     Nutritional Status: BMI 25 -29 Overweight Nutritional Risks: None Diabetes: No  How often do you need to have someone help you when you read instructions, pamphlets, or other written materials from your doctor or pharmacy?: 1 - Never What is the last grade level you completed in school?: BS degree  Interpreter Needed?: No  Information entered by :: NAllen LPN  Past Medical History:  Diagnosis Date  . Adenomatous colon polyp 05/1985  . Allergy   . Aortic stenosis   . Cataract   . Cholelithiasis   . Diverticulosis   . GERD (gastroesophageal reflux disease)   . Headache    migraines prior to hysterectomy  . Heart murmur   . Hyperlipidemia   . Hypertension   . Osteopenia   . PONV (postoperative nausea and vomiting)    nausea  . Shingles    Past Surgical History:  Procedure Laterality Date  . ABDOMINAL HYSTERECTOMY    . AORTIC VALVE REPLACEMENT N/A 08/16/2015   Procedure: AORTIC VALVE REPLACEMENT (AVR);  Surgeon: Ivin Poot, MD;  Location: [facility];  Service: Open Heart Surgery;  Laterality: N/A;  . APPENDECTOMY     taken out with hysterectomy  . BREAST EXCISIONAL BIOPSY Right 1990   EXCISIONAL - NEG  . CARDIAC CATHETERIZATION Bilateral 08/04/2015   Procedure: Right/Left Heart Cath and Coronary  Angiography;  Surgeon: Wellington Hampshire, MD;  Location: [facility];  Service: Cardiovascular;  Laterality: Bilateral;  . CATARACT EXTRACTION, BILATERAL  2020  . ERCP N/A 10/31/2016   Procedure: ENDOSCOPIC RETROGRADE CHOLANGIOPANCREATOGRAPHY (ERCP);  Surgeon: Ladene Artist, MD;  Location: [facility] END[facility]COPY;  Service: Endoscopy;  Laterality: N/A;  . EYE SURGERY N/A    detatched retina- "not sure which eye"  . KNEE SURGERY  10/2005   calcinosis - in office procedure  . TEE WITHOUT CARDIOVERSION N/A 08/16/2015   Procedure:  TRANSESOPHAGEAL ECHOCARDIOGRAM (TEE);  Surgeon: Ivin Poot, MD;  Location: [facility];  Service: Open Heart Surgery;  Laterality: N/A;   Family History  Problem Relation Age of Onset  . Cancer Mother        lung  . Stroke Mother   . Cancer Father        pancreatic  . Pancreatic cancer Father   . Heart disease Sister   . Hypertension Brother   . Cancer Brother   . Diabetes Maternal Grandmother   . Hypertension Brother   . Obesity Sister   . Lung cancer Brother   . Breast cancer Neg Hx   . Colon cancer Neg Hx   . Esophageal cancer Neg Hx   . Rectal cancer Neg Hx   . Stomach cancer Neg Hx    Social History   Socioeconomic History  . Marital status: Married    Spouse name: Not on file  . Number of children: 3  . Years of education: Not on file  . Highest education level: Not on file  Occupational History  . Occupation: Pharmacist, hospital  . Occupation: retired  Scientific laboratory technician  . Financial resource strain: Not hard at all  . Food insecurity    Worry: Never true    Inability: Never true  . Transportation needs    Medical: No    Non-medical: No  Tobacco Use  . Smoking status: Former Smoker    Years: 25.00    Quit date: 07/07/2002    Years since quitting: 16.3  . Smokeless tobacco: Never Used  . Tobacco comment: light smoker before quitting   Substance and Sexual Activity  . Alcohol use: No    Alcohol/week: 0.0 standard drinks  . Drug use: No  . Sexual activity: Not Currently  Lifestyle  . Physical activity    Days per week: 0 days    Minutes per session: 0 min  . Stress: Not at all  Relationships  . Social Herbalist on phone: Not on file    Gets together: Not on file    Attends religious service: Not on file    Active member of club or organization: Not on file    Attends meetings of clubs or organizations: Not on file    Relationship status: Not on file  Other Topics Concern  . Not on file  Social History Narrative  . Not on file    Outpatient Encounter  Medications as of 11/25/2018  Medication Sig  . acetaminophen (TYLENOL) 325 MG tablet Take 650 mg by mouth every 6 (six) hours as needed for mild pain, moderate pain, fever or headache.  Marland Kitchen aspirin EC 81 MG tablet Take 1 tablet (81 mg total) by mouth daily.  Marland Kitchen atorvastatin (LIPITOR) 10 MG tablet TAKE 1/2 TABLET BY MOUTH EVERY DAY  . Calcium Polycarbophil (FIBER LAXATIVE PO) Take 2 tablets by mouth 2 (two) times a day.  . cholecalciferol (VITAMIN D) 1000 UNITS  tablet Take 1,000 Units by mouth 2 (two) times daily.   Marland Kitchen esomeprazole (NEXIUM) 40 MG capsule TAKE 1 CAPSULE BY MOUTH EVERY DAY BEFORE BREAKFAST  . fluticasone (FLONASE) 50 MCG/ACT nasal spray Place 1-2 sprays into both nostrils daily as needed for rhinitis.  Marland Kitchen lisinopril (ZESTRIL) 20 MG tablet Take 1 tablet (20 mg total) by mouth daily.  . metoprolol tartrate (LOPRESSOR) 25 MG tablet Take 1 tablet (25 mg total) by mouth 2 (two) times daily.  . Multiple Vitamin (MULTIVITAMIN WITH MINERALS) TABS tablet Take 1 tablet by mouth daily.  Marland Kitchen omega-3 acid ethyl esters (LOVAZA) 1 g capsule Take 1 g by mouth 2 (two) times daily.   No facility-administered encounter medications on file as of 11/25/2018.     Activities of Daily Living In your present state of health, do you have any difficulty performing the following activities: 11/25/2018  Hearing? N  Vision? N  Difficulty concentrating or making decisions? N  Walking or climbing stairs? N  Dressing or bathing? N  Doing errands, shopping? N  Preparing Food and eating ? N  Using the Toilet? N  In the past six months, have you accidently leaked urine? N  Do you have problems with loss of bowel control? N  Managing your Medications? N  Managing your Finances? N  Housekeeping or managing your Housekeeping? N  Some recent data might be hidden    Patient Care Team: [place], Wynelle Fanny, MD as PCP - General Patty, A. Joneen Caraway, MD as Consulting Physician (Ophthalmology) Garrel Ridgel, DPM as Consulting  Physician (Podiatry) Iris Pert, [place] as Referring Physician (Chiropractic Medicine)    Assessment:   This is a routine wellness examination for Heidi Middleton.  Exercise Activities and Dietary recommendations Current Exercise Habits: The patient does not participate in regular exercise at present(works in garden)  Goals    . Increase physical activity     Starting 06/20/2017, I will continue to exercise for 60 minutes 4 days a week.     . Patient Stated     11/25/2018, get to gym when they reopen       Fall Risk Fall Risk  11/25/2018 06/20/2017 06/19/2016 09/12/2015 06/20/2015  Falls in the past year? 1 No No No Yes  Comment fell down back steps - - - doing yard work; taking care of dog  Number falls in past yr: - - - - 2 or more  Injury with Fall? 1 - - - Yes  Comment dislocated toe - - - -  Risk Factor Category  - - - - High Fall Risk  Risk for fall due to : History of fall(s);Medication side effect - - Medication side effect -  Follow up Falls evaluation completed;Falls prevention discussed - - - Falls evaluation completed;Education provided   Is the patient's home free of loose throw rugs in walkways, pet beds, electrical cords, etc?   yes      Grab bars in the bathroom? no      Handrails on the stairs?   yes      Adequate lighting?   yes  Timed Get Up and Go performed: n/a  Depression Screen PHQ 2/9 Scores 11/25/2018 06/20/2017 06/19/2016 11/02/2015  PHQ - 2 Score 6 0 0 0  PHQ- 9 Score 15 0 - 1     Cognitive Function MMSE - Mini Mental State Exam 11/25/2018 06/20/2017 06/19/2016 06/20/2015  Orientation to time 4 5 5 5   Orientation to Place 5 5 5 5   Registration 3  3 3 3   Attention/ Calculation 5 0 0 0  Recall 3 3 3 3   Language- name 2 objects 0 0 0 0  Language- repeat 1 1 1 1   Language- follow 3 step command 0 3 3 3   Language- read & follow direction 0 0 0 0  Write a sentence 0 0 0 0  Copy design 0 0 0 0  Total score 21 20 20 20    Mini Cog  Mini-Cog screen was completed. Maximum score is  22. A value of 0 denotes this part of the MMSE was not completed or the patient failed this part of the Mini-Cog screening.       Immunization History  Administered Date(s) Administered  . Influenza Whole 01/23/2005, 01/18/2009, 01/31/2010  . Influenza,inj,Quad PF,6+ Mos 01/01/2013, 12/29/2013, 01/27/2015, 12/09/2015, 12/13/2016, 12/10/2017  . Influenza-Unspecified 01/04/2012  . Pneumococcal Conjugate-13 06/21/2014  . Pneumococcal Polysaccharide-23 03/09/2002, 01/18/2009, 06/24/2015  . Td 09/20/2004  . Tdap 06/21/2014  . Zoster 10/24/2006    Qualifies for Shingles Vaccine? yes  Screening Tests Health Maintenance  Topic Date Due  . INFLUENZA VACCINE  10/18/2018  . MAMMOGRAM  08/18/2019  . COLONOSCOPY  12/12/2022  . TETANUS/TDAP  06/20/2024  . DEXA SCAN  Completed  . Hepatitis C Screening  Completed  . PNA vac Low Risk Adult  Completed    Cancer Screenings: Lung: Low Dose CT Chest recommended if Age 85-80 years, 30 pack-year currently smoking OR have quit w/in 15years. Patient does not qualify. Breast:  Up to date on Mammogram? Yes   Up to date of Bone Density/Dexa? Yes Colorectal: up to date  Additional Screenings: : Hepatitis C Screening: 06/2015     Plan:    Patient wants to get back to the gym when able.   I have personally reviewed and noted the following in the patient's chart:   . Medical and social history . Use of alcohol, tobacco or illicit drugs  . Current medications and supplements . Functional ability and status . Nutritional status . Physical activity . Advanced directives . List of other physicians . Hospitalizations, surgeries, and ER visits in previous 12 months . Vitals . Screenings to include cognitive, depression, and falls . Referrals and appointments  In addition, I have reviewed and discussed with patient certain preventive protocols, quality metrics, and best practice recommendations. A written personalized care plan for preventive  services as well as general preventive health recommendations were provided to patient.     Kellie Simmering, LPN  [id number]

## 2018-11-26 ENCOUNTER — Other Ambulatory Visit (INDEPENDENT_AMBULATORY_CARE_PROVIDER_SITE_OTHER): Payer: Medicare Other

## 2018-11-26 ENCOUNTER — Ambulatory Visit (INDEPENDENT_AMBULATORY_CARE_PROVIDER_SITE_OTHER): Payer: Medicare Other

## 2018-11-26 ENCOUNTER — Telehealth (INDEPENDENT_AMBULATORY_CARE_PROVIDER_SITE_OTHER): Payer: Medicare Other | Admitting: Family Medicine

## 2018-11-26 ENCOUNTER — Other Ambulatory Visit: Payer: Self-pay

## 2018-11-26 ENCOUNTER — Other Ambulatory Visit: Payer: Self-pay | Admitting: Cardiovascular Disease

## 2018-11-26 DIAGNOSIS — E7800 Pure hypercholesterolemia, unspecified: Secondary | ICD-10-CM | POA: Diagnosis not present

## 2018-11-26 DIAGNOSIS — I10 Essential (primary) hypertension: Secondary | ICD-10-CM

## 2018-11-26 DIAGNOSIS — R7309 Other abnormal glucose: Secondary | ICD-10-CM

## 2018-11-26 DIAGNOSIS — I359 Nonrheumatic aortic valve disorder, unspecified: Secondary | ICD-10-CM

## 2018-11-26 LAB — CBC WITH DIFFERENTIAL/PLATELET
Basophils Absolute: 0.1 10*3/uL (ref 0.0–0.1)
Basophils Relative: 1.1 % (ref 0.0–3.0)
Eosinophils Absolute: 0.1 10*3/uL (ref 0.0–0.7)
Eosinophils Relative: 2.4 % (ref 0.0–5.0)
HCT: 38.3 % (ref 36.0–46.0)
Hemoglobin: 12.9 g/dL (ref 12.0–15.0)
Lymphocytes Relative: 33.4 % (ref 12.0–46.0)
Lymphs Abs: 1.6 10*3/uL (ref 0.7–4.0)
MCHC: 33.6 g/dL (ref 30.0–36.0)
MCV: 91.1 fl (ref 78.0–100.0)
Monocytes Absolute: 0.4 10*3/uL (ref 0.1–1.0)
Monocytes Relative: 8.9 % (ref 3.0–12.0)
Neutro Abs: 2.5 10*3/uL (ref 1.4–7.7)
Neutrophils Relative %: 54.2 % (ref 43.0–77.0)
Platelets: 210 10*3/uL (ref 150.0–400.0)
RBC: 4.2 Mil/uL (ref 3.87–5.11)
RDW: 13.8 % (ref 11.5–15.5)
WBC: 4.7 10*3/uL (ref 4.0–10.5)

## 2018-11-26 LAB — COMPREHENSIVE METABOLIC PANEL
ALT: 14 U/L (ref 0–35)
AST: 21 U/L (ref 0–37)
Albumin: 4 g/dL (ref 3.5–5.2)
Alkaline Phosphatase: 79 U/L (ref 39–117)
BUN: 11 mg/dL (ref 6–23)
CO2: 29 mEq/L (ref 19–32)
Calcium: 9.9 mg/dL (ref 8.4–10.5)
Chloride: 104 mEq/L (ref 96–112)
Creatinine, Ser: 0.88 mg/dL (ref 0.40–1.20)
GFR: 62.78 mL/min (ref >60.00)
Glucose, Bld: 88 mg/dL (ref 70–99)
Potassium: 4.5 mEq/L (ref 3.5–5.1)
Sodium: 139 mEq/L (ref 135–145)
Total Bilirubin: 0.5 mg/dL (ref 0.2–1.2)
Total Protein: 6.5 g/dL (ref 6.0–8.3)

## 2018-11-26 LAB — TSH: TSH: 2.08 u[IU]/mL (ref 0.35–4.50)

## 2018-11-26 LAB — LIPID PANEL
Cholesterol: 174 mg/dL (ref 0–200)
HDL: 71.3 mg/dL (ref >39.00)
LDL Cholesterol: 84 mg/dL (ref 0–99)
NonHDL: 102.87
Total CHOL/HDL Ratio: 2
Triglycerides: 96 mg/dL (ref 0.0–149.0)
VLDL: 19.2 mg/dL (ref 0.0–40.0)

## 2018-11-26 LAB — HEMOGLOBIN A1C: Hgb A1c MFr Bld: 5.5 % (ref 4.6–6.5)

## 2018-11-26 NOTE — Telephone Encounter (Signed)
-----   Message from Heidi Middleton sent at 11/26/2018  8:51 AM EDT ----- Regarding: lab orders for now Patient is scheduled for CPX labs, please order future labs, Thanks , Karna Christmas

## 2018-12-02 ENCOUNTER — Encounter: Payer: Self-pay | Admitting: Family Medicine

## 2018-12-02 ENCOUNTER — Ambulatory Visit (INDEPENDENT_AMBULATORY_CARE_PROVIDER_SITE_OTHER): Payer: Medicare Other | Admitting: Family Medicine

## 2018-12-02 ENCOUNTER — Other Ambulatory Visit: Payer: Self-pay

## 2018-12-02 VITALS — BP 158/77 | HR 61 | Temp 97.2°F | Ht 67.0 in | Wt 164.5 lb

## 2018-12-02 DIAGNOSIS — R7309 Other abnormal glucose: Secondary | ICD-10-CM | POA: Diagnosis not present

## 2018-12-02 DIAGNOSIS — Z23 Encounter for immunization: Secondary | ICD-10-CM | POA: Diagnosis not present

## 2018-12-02 DIAGNOSIS — I10 Essential (primary) hypertension: Secondary | ICD-10-CM

## 2018-12-02 DIAGNOSIS — Z0000 Encounter for general adult medical examination without abnormal findings: Secondary | ICD-10-CM | POA: Diagnosis not present

## 2018-12-02 DIAGNOSIS — M85839 Other specified disorders of bone density and structure, unspecified forearm: Secondary | ICD-10-CM | POA: Diagnosis not present

## 2018-12-02 DIAGNOSIS — E7800 Pure hypercholesterolemia, unspecified: Secondary | ICD-10-CM

## 2018-12-02 DIAGNOSIS — I350 Nonrheumatic aortic (valve) stenosis: Secondary | ICD-10-CM

## 2018-12-02 MED ORDER — ESOMEPRAZOLE MAGNESIUM 40 MG PO CPDR: DELAYED_RELEASE_CAPSULE | ORAL | 3 refills | Status: AC

## 2018-12-02 MED ORDER — ATORVASTATIN CALCIUM 10 MG PO TABS: ORAL_TABLET | ORAL | 3 refills | Status: DC

## 2018-12-02 NOTE — Assessment & Plan Note (Signed)
Mild in forearm 9/18  Ok with waiting another year due to pandemic for next one  Taking D and ca  One fall/ no fractures Disc fall prev Enc exercise

## 2018-12-02 NOTE — Patient Instructions (Addendum)
Keep watching blood pressure and let your cardiologist know it is still high   Watch your sodium intake  Take care of yourself  Labs look good   Flu shot today

## 2018-12-02 NOTE — Assessment & Plan Note (Signed)
Disc goals for lipids and reasons to control them Rev last labs with pt Rev low sat fat diet in detail  Controlled with atorvastatin and diet 

## 2018-12-02 NOTE — Assessment & Plan Note (Signed)
BP: (!) 158/77    Improved on 2nd check but still not at goal  Pt plans to discuss with her cardiologist-also high at home despite recent medication changes Enc low sodium diet

## 2018-12-02 NOTE — Assessment & Plan Note (Signed)
Lab Results  Component Value Date   HGBA1C 5.5 11/26/2018   Not in DM range Enc low glycemic diet for better health

## 2018-12-02 NOTE — Assessment & Plan Note (Signed)
Per pt no clinical changes  Recent echo

## 2018-12-02 NOTE — Progress Notes (Signed)
Subjective:    Patient ID: Heidi Middleton, female    DOB: March 27, 1945, 73 y.o.   MRN: [id number]  HPI Here for health maintenance exam and to review chronic medical problems    Had amw on 9/18  No gaps or concerns Flu shot given today   zostavax 8/18   Wt Readings from Last 3 Encounters:  12/02/18 164 lb 8 oz (74.6 kg)  11/25/18 167 lb (75.8 kg)  11/03/18 166 lb 6.4 oz (75.5 kg)  wt is stable Working in the garden more this year  25.76 kg/m   Mammogram 6/20  Self breast exam -no lumps or changes   Colonoscopy 9/19 with 5 y recall   dexa 9/18 -osteopenia in forearm Falls -had a fall and dislocated toe - carrying her daughter's dog down the stairs  Fractures-none  Her son built a handicapped ramp  Supplements -taking vit D (calcium constipated her) - she tried a fiber pill with calcium in it  Exercise-garden work   bp is up today   2nd check BP: (!) 158/77     No cp or palpitations or headaches or edema  No side effects to medicines  BP Readings from Last 3 Encounters:  12/02/18 (!) 170/86  11/03/18 (!) 174/88  08/13/18 (!) 142/80    Seen by cardiologist  Inc her metoprolol to 25 mg bid Also lisinopril 20 mg daily  Blood pressure has been high at home also  Feels anxious with mask on "it suffocates me" She declines anxiety medicine for this because she does not go out/have to wear it that much  Sees cardiology back in 6 months   Pulse Readings from Last 3 Encounters:  12/02/18 61  11/03/18 69  08/13/18 96     Lab Results  Component Value Date   CREATININE 0.88 11/26/2018   BUN 11 11/26/2018   NA 139 11/26/2018   K 4.5 11/26/2018   CL 104 11/26/2018   CO2 29 11/26/2018   Lab Results  Component Value Date   ALT 14 11/26/2018   AST 21 11/26/2018   ALKPHOS 79 11/26/2018   BILITOT 0.5 11/26/2018   Lab Results  Component Value Date   WBC 4.7 11/26/2018   HGB 12.9 11/26/2018   HCT 38.3 11/26/2018   MCV 91.1 11/26/2018   PLT 210.0 11/26/2018    Lab Results  Component Value Date   TSH 2.08 11/26/2018    H/o elevated glucose Lab Results  Component Value Date   HGBA1C 5.5 11/26/2018   Hyperlipidemia Lab Results  Component Value Date   CHOL 174 11/26/2018   CHOL 167 06/20/2017   CHOL 181 06/19/2016   Lab Results  Component Value Date   HDL 71.30 11/26/2018   HDL 74.30 06/20/2017   HDL 77.50 06/19/2016   Lab Results  Component Value Date   LDLCALC 84 11/26/2018   LDLCALC 77 06/20/2017   LDLCALC 89 06/19/2016   Lab Results  Component Value Date   TRIG 96.0 11/26/2018   TRIG 80.0 06/20/2017   TRIG 72.0 06/19/2016   Lab Results  Component Value Date   CHOLHDL 2 11/26/2018   CHOLHDL 2 06/20/2017   CHOLHDL 2 06/19/2016   No results found for: LDLDIRECT Atorvastatin and good diet  Eating better -out of the garden   Patient Active Problem List   Diagnosis Date Noted  . Toe dislocation, left, subsequent encounter 05/14/2018  . Elevated glucose level 06/19/2017  . Colitis 09/25/2016  . Estrogen deficiency 06/26/2016  .  Aortic valve stenosis   . Routine general medical examination at a health care facility 06/24/2015  . Heart murmur, systolic [id number]  . Need for hepatitis C screening test 06/20/2015  . Medicare annual wellness visit, subsequent 06/20/2015  . Encounter for Medicare annual wellness exam 06/13/2012  . Osteopenia 01/31/2010  . GERD 12/21/2009  . HYPERCHOLESTEROLEMIA 10/09/2006  . Quit smoking 10/09/2006  . GLAUCOMA 10/09/2006  . Essential hypertension 10/09/2006  . 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 OSTEOARTHRITIS 10/09/2006  . COLONIC POLYPS, HX OF 10/09/2006   Past Medical History:  Diagnosis Date  . Adenomatous colon polyp 05/1985  . Allergy   . Aortic stenosis   . Cataract   . Cholelithiasis   . Diverticulosis   . GERD (gastroesophageal reflux disease)   . Headache    migraines prior to hysterectomy  . Heart murmur   . Hyperlipidemia   . Hypertension   . Osteopenia    . PONV (postoperative nausea and vomiting)    nausea  . Shingles    Past Surgical History:  Procedure Laterality Date  . ABDOMINAL HYSTERECTOMY    . AORTIC VALVE REPLACEMENT N/A 08/16/2015   Procedure: AORTIC VALVE REPLACEMENT (AVR);  Surgeon: Ivin Poot, MD;  Location: [place];  Service: Open Heart Surgery;  Laterality: N/A;  . APPENDECTOMY     taken out with hysterectomy  . BREAST EXCISIONAL BIOPSY Right 1990   EXCISIONAL - NEG  . CARDIAC CATHETERIZATION Bilateral 08/04/2015   Procedure: Right/Left Heart Cath and Coronary Angiography;  Surgeon: Wellington Hampshire, MD;  Location: [place] [facility];  Service: Cardiovascular;  Laterality: Bilateral;  . CATARACT EXTRACTION, BILATERAL  2020  . ERCP N/A 10/31/2016   Procedure: ENDOSCOPIC RETROGRADE CHOLANGIOPANCREATOGRAPHY (ERCP);  Surgeon: Ladene Artist, MD;  Location[facility] END[facility];  Service: Endoscopy;  Laterality: N/A;  . EYE SURGERY N/A    detatched retina- "not sure which eye"  . KNEE SURGERY  10/2005   calcinosis - in office procedure  . TEE WITHOUT CARDIOVERSION N/A 08/16/2015   Procedure: TRANSESOPHAGEAL ECHOCARDIOGRAM (TEE);  Surgeon: Ivin Poot, MD;  Location: [place];  Service: Open Heart Surgery;  Laterality: N/A;   Social History   Tobacco Use  . Smoking status: Former Smoker    Years: 25.00    Quit date: 07/07/2002    Years since quitting: 16.4  . Smokeless tobacco: Never Used  . Tobacco comment: light smoker before quitting   Substance Use Topics  . Alcohol use: No    Alcohol/week: 0.0 standard drinks  . Drug use: No   Family History  Problem Relation Age of Onset  . Cancer Mother        lung  . Stroke Mother   . Cancer Father        pancreatic  . Pancreatic cancer Father   . Heart disease Sister   . Hypertension Brother   . Cancer Brother   . Diabetes Maternal Grandmother   . Hypertension Brother   . Obesity Sister   . Lung cancer Brother   . Breast cancer Neg Hx   . Colon cancer Neg Hx   .  Esophageal cancer Neg Hx   . Rectal cancer Neg Hx   . Stomach cancer Neg Hx    Allergies  Allergen Reactions  . Codeine Nausea And Vomiting   Current Outpatient Medications on File Prior to Visit  Medication Sig Dispense Refill  . acetaminophen (TYLENOL) 325 MG tablet Take 650 mg by mouth every 6 (  six) hours as needed for mild pain, moderate pain, fever or headache.    Marland Kitchen aspirin EC 81 MG tablet Take 1 tablet (81 mg total) by mouth daily. 90 tablet 3  . Calcium Polycarbophil (FIBER LAXATIVE PO) Take 2 tablets by mouth 2 (two) times a day.    . cholecalciferol (VITAMIN D) 1000 UNITS tablet Take 1,000 Units by mouth 2 (two) times daily.     . fluticasone (FLONASE) 50 MCG/ACT nasal spray Place 1-2 sprays into both nostrils daily as needed for rhinitis.    Marland Kitchen lisinopril (ZESTRIL) 20 MG tablet Take 1 tablet (20 mg total) by mouth daily. 90 tablet 3  . metoprolol tartrate (LOPRESSOR) 25 MG tablet Take 1 tablet (25 mg total) by mouth 2 (two) times daily. 180 tablet 3  . Multiple Vitamin (MULTIVITAMIN WITH MINERALS) TABS tablet Take 1 tablet by mouth daily.    Marland Kitchen omega-3 acid ethyl esters (LOVAZA) 1 g capsule Take 1 g by mouth 2 (two) times daily.     No current facility-administered medications on file prior to visit.     Review of Systems  Constitutional: Negative for activity change, appetite change, fatigue, fever and unexpected weight change.  HENT: Negative for congestion, ear pain, rhinorrhea, sinus pressure and sore throat.   Eyes: Negative for pain, redness and visual disturbance.  Respiratory: Negative for cough, shortness of breath and wheezing.   Cardiovascular: Negative for chest pain and palpitations.  Gastrointestinal: Negative for abdominal pain, blood in stool, constipation and diarrhea.  Endocrine: Negative for polydipsia and polyuria.  Genitourinary: Negative for dysuria, frequency and urgency.  Musculoskeletal: Negative for arthralgias, back pain and myalgias.  Skin: Negative  for pallor and rash.  Allergic/Immunologic: Negative for environmental allergies.  Neurological: Negative for dizziness, syncope and headaches.  Hematological: Negative for adenopathy. Does not bruise/bleed easily.  Psychiatric/Behavioral: Negative for decreased concentration and dysphoric mood. The patient is not nervous/anxious.        Very frustrated by the pandemic        Objective:   Physical Exam Constitutional:      General: She is not in acute distress.    Appearance: Normal appearance. She is well-developed. She is not ill-appearing or diaphoretic.  HENT:     Head: Normocephalic and atraumatic.     Right Ear: Tympanic membrane, ear canal and external ear normal.     Left Ear: Tympanic membrane, ear canal and external ear normal.     Nose: Nose normal. No congestion.     Mouth/Throat:     Mouth: Mucous membranes are moist.     Pharynx: Oropharynx is clear. No posterior oropharyngeal erythema.  Eyes:     General: No scleral icterus.    Extraocular Movements: Extraocular movements intact.     Conjunctiva/sclera: Conjunctivae normal.     Pupils: Pupils are equal, round, and reactive to light.  Neck:     Musculoskeletal: Normal range of motion and neck supple. No neck rigidity or muscular tenderness.     Thyroid: No thyromegaly.     Vascular: No carotid bruit or JVD.  Cardiovascular:     Rate and Rhythm: Normal rate and regular rhythm.     Pulses: Normal pulses.     Heart sounds: Normal heart sounds. No gallop.   Pulmonary:     Effort: Pulmonary effort is normal. No respiratory distress.     Breath sounds: Normal breath sounds. No wheezing.     Comments: Good air exch Chest:     Chest  wall: No tenderness.  Abdominal:     General: Bowel sounds are normal. There is no distension or abdominal bruit.     Palpations: Abdomen is soft. There is no mass.     Tenderness: There is no abdominal tenderness.     Hernia: No hernia is present.  Genitourinary:    Comments: Breast  exam: No mass, nodules, thickening, tenderness, bulging, retraction, inflamation, nipple discharge or skin changes noted.  No axillary or clavicular LA.     Musculoskeletal: Normal range of motion.        General: No tenderness.     Right lower leg: No edema.     Left lower leg: No edema.     Comments: Ankles are puffy No pitting edema  No kyphosis   Lymphadenopathy:     Cervical: No cervical adenopathy.  Skin:    General: Skin is warm and dry.     Coloration: Skin is not pale.     Findings: No erythema or rash.     Comments: Mildly tanned Some lentigines  Few small skin tags axillary  Neurological:     Mental Status: She is alert. Mental status is at baseline.     Cranial Nerves: No cranial nerve deficit.     Motor: No abnormal muscle tone.     Coordination: Coordination normal.     Gait: Gait normal.     Deep Tendon Reflexes: Reflexes are normal and symmetric. Reflexes normal.  Psychiatric:        Mood and Affect: Mood normal.        Cognition and Memory: Cognition and memory normal.     Comments: Pt is somewhat irritable today-voices frustration over the pandemic and other issues            Assessment & Plan:   Problem List Items Addressed This Visit      Cardiovascular and Mediastinum   Essential hypertension    BP: (!) 158/77    Improved on 2nd check but still not at goal  Pt plans to discuss with her cardiologist-also high at home despite recent medication changes Enc low sodium diet        Relevant Medications   atorvastatin (LIPITOR) 10 MG tablet   Aortic valve stenosis    Per pt no clinical changes  Recent echo      Relevant Medications   atorvastatin (LIPITOR) 10 MG tablet     Musculoskeletal and Integument   Osteopenia    Mild in forearm 9/18  Ok with waiting another year due to pandemic for next one  Taking D and ca  One fall/ no fractures Disc fall prev Enc exercise         Other   HYPERCHOLESTEROLEMIA    Disc goals for lipids and  reasons to control them Rev last labs with pt Rev low sat fat diet in detail Controlled with atorvastatin and diet        Relevant Medications   atorvastatin (LIPITOR) 10 MG tablet   Routine general medical examination at a health care facility - Primary    Reviewed health habits including diet and exercise and skin cancer prevention Reviewed appropriate screening tests for age  Also reviewed health mt list, fam hx and immunization status , as well as social and family history   See HPI Labs rev  Last dexa rev utd breast and colon screening  Enc good diet and exercise Flu shot today  F/u with cardiology re: blood pressure  Continue ca/D  and fish oil       Relevant Orders   Flu Vaccine QUAD High Dose(Fluad) (Completed)   Elevated glucose level    Lab Results  Component Value Date   HGBA1C 5.5 11/26/2018   Not in DM range Enc low glycemic diet for better health       Other Visit Diagnoses    Need for influenza vaccination       Relevant Orders   Flu Vaccine QUAD High Dose(Fluad) (Completed)

## 2018-12-02 NOTE — Assessment & Plan Note (Signed)
Reviewed health habits including diet and exercise and skin cancer prevention Reviewed appropriate screening tests for age  Also reviewed health mt list, fam hx and immunization status , as well as social and family history   See HPI Labs rev  Last dexa rev utd breast and colon screening  Enc good diet and exercise Flu shot today  F/u with cardiology re: blood pressure  Continue ca/D and fish oil

## 2018-12-03 ENCOUNTER — Telehealth: Payer: Self-pay | Admitting: Cardiovascular Disease

## 2018-12-03 NOTE — Telephone Encounter (Signed)
Patient calling in for results from Echo done on 9/9. Please advise

## 2018-12-03 NOTE — Telephone Encounter (Signed)
The patient's echo is currently pending in Dr. Donivan Scull in basket. To MD to please review.

## 2018-12-04 NOTE — Telephone Encounter (Signed)
Results reviewed with patient. See results note.

## 2018-12-05 ENCOUNTER — Telehealth: Payer: Self-pay | Admitting: *Deleted

## 2018-12-05 MED ORDER — HYDROCHLOROTHIAZIDE 25 MG PO TABS: 25.0000 mg | ORAL_TABLET | ORAL | 3 refills | Status: DC

## 2018-12-05 MED ORDER — POTASSIUM CHLORIDE ER 10 MEQ PO TBCR: 10.0000 meq | EXTENDED_RELEASE_TABLET | ORAL | 3 refills | Status: DC

## 2018-12-05 NOTE — Telephone Encounter (Signed)
Spoke with patient and reviewed provider recommendations to start medication ordered and to let us know if this will help with her blood pressure readings. She verbalized understanding of recommendations and had no further questions at this time.

## 2018-12-05 NOTE — Telephone Encounter (Signed)
-----   Message from Minna Merritts, MD sent at 12/03/2018  2:43 PM EDT ----- Regarding: bp Triage/Pam Can we recommend she start HCTZ 25 mg 3 times a week Would take K-Dur 10 mill equivalents 3 days a week  If this brings her blood pressure down  we could consider changing lisinopril to lisinopril HCTZ combo pill in the future Thx TG   ----- Message ----- From: Abner Greenspan, MD Sent: 12/02/2018   1:17 PM EDT To: Minna Merritts, MD  BP is down a bit but still running high

## 2018-12-09 ENCOUNTER — Ambulatory Visit: Payer: Medicare Other | Admitting: Cardiovascular Disease

## 2018-12-09 ENCOUNTER — Other Ambulatory Visit: Payer: Self-pay

## 2018-12-09 NOTE — Telephone Encounter (Signed)
*  STAT* If patient is at the pharmacy, call can be transferred to refill team.   1. Which medications need to be refilled? (please list name of each medication and dose if known) Hydrochlorothiazide, Potassium  2. Which pharmacy/location (including street and city if local pharmacy) is medication to be sent to? [facility]  3. Do they need a 30 day or 90 day supply? [place]

## 2019-02-26 ENCOUNTER — Other Ambulatory Visit: Payer: Self-pay

## 2019-02-26 MED ORDER — POTASSIUM CHLORIDE ER 10 MEQ PO TBCR: 10.0000 meq | EXTENDED_RELEASE_TABLET | ORAL | 3 refills | Status: DC

## 2019-02-26 MED ORDER — HYDROCHLOROTHIAZIDE 25 MG PO TABS: 25.0000 mg | ORAL_TABLET | ORAL | 3 refills | Status: DC

## 2019-02-26 NOTE — Telephone Encounter (Signed)
*  STAT* If patient is at the pharmacy, call can be transferred to refill team.   1. Which medications need to be refilled? (please list name of each medication and dose if known) hydrochlorothiazide, potassium  2. Which pharmacy/location (including street and city if local pharmacy) is medication to be sent to? [facility] 3. Do they need a 30 day or 90 day supply? [place]

## 2019-04-14 DIAGNOSIS — M9902 Segmental and somatic dysfunction of thoracic region: Secondary | ICD-10-CM | POA: Diagnosis not present

## 2019-04-14 DIAGNOSIS — M5033 Other cervical disc degeneration, cervicothoracic region: Secondary | ICD-10-CM | POA: Diagnosis not present

## 2019-04-14 DIAGNOSIS — M9901 Segmental and somatic dysfunction of cervical region: Secondary | ICD-10-CM | POA: Diagnosis not present

## 2019-04-14 DIAGNOSIS — M62830 Muscle spasm of back: Secondary | ICD-10-CM | POA: Diagnosis not present

## 2019-05-02 NOTE — Progress Notes (Signed)
Who cardiology Office Note  Date:  05/04/2019   ID:  JENISSA GAYDA, DOB [birth year], MRN [id number]  PCP:  Abner Greenspan, MD   Chief Complaint  Patient presents with  . office visit    6 month F/U; Meds verbally reviewed with patient.    HPI:  LORRIE ROUNSAVILLE is a 74 y.o. female with PMH of s/p AVR 08/16/2015 bovine previous smoker Does exercise 4 days a week  presents for follow-up of her aortic valve stenosis, s/p AVR, HTN  Echo 11/2018 discussed in detail Echo prior to that 2018 Stable AVR  Some SOB, thinks it might be because she is  "out of shape" Used to go to gym 4x a week Now no gym No regular exercise at home  Some stress at home, pushes BP up But in general blood pressure well controlled Does not feel she is overmedicated  No significant chest pain, no CHF symptoms, no PND orthopnea leg swelling  EKG personally reviewed by myself on todays visit Shows normal sinus rhythm rate 88 bpm no significant ST or T-wave changes   Other past medical history reviewed [facility] 08/04/2015:  Prox RCA to Mid RCA lesion, 30% stenosed.  1. Mild nonobstructive coronary artery disease. 2. Severe aortic valve stenosis with a mean gradient of 51.6 mmHg with a valve area of 0.67. 3. Right heart catheterization showed normal pulmonary pressure, normal cardiac output and mildly elevated pulmonary wedge pressure.  Echo 06/28/2016 Unchanged  Echo 2020, stable prosthetic valve    PMH:   has a past medical history of Adenomatous colon polyp (05/1985), Allergy, Aortic stenosis, Cataract, Cholelithiasis, Diverticulosis, GERD (gastroesophageal reflux disease), Headache, Heart murmur, Hyperlipidemia, Hypertension, Osteopenia, PONV (postoperative nausea and vomiting), and Shingles.  PSH:    Past Surgical History:  Procedure Laterality Date  . ABDOMINAL HYSTERECTOMY    . AORTIC VALVE REPLACEMENT N/A 08/16/2015   Procedure: AORTIC VALVE REPLACEMENT (AVR);  Surgeon: Ivin Poot, MD;   Location: [place];  Service: Open Heart Surgery;  Laterality: N/A;  . APPENDECTOMY     taken out with hysterectomy  . BREAST EXCISIONAL BIOPSY Right 1990   EXCISIONAL - NEG  . CARDIAC CATHETERIZATION Bilateral 08/04/2015   Procedure: Right/Left Heart Cath and Coronary Angiography;  Surgeon: Wellington Hampshire, MD;  Location: [place] [facility];  Service: Cardiovascular;  Laterality: Bilateral;  . CATARACT EXTRACTION, BILATERAL  2020  . ERCP N/A 10/31/2016   Procedure: ENDOSCOPIC RETROGRADE CHOLANGIOPANCREATOGRAPHY (ERCP);  Surgeon: Ladene Artist, MD;  Location: [facility] END[facility];  Service: Endoscopy;  Laterality: N/A;  . EYE SURGERY N/A    detatched retina- "not sure which eye"  . KNEE SURGERY  10/2005   calcinosis - in office procedure  . TEE WITHOUT CARDIOVERSION N/A 08/16/2015   Procedure: TRANSESOPHAGEAL ECHOCARDIOGRAM (TEE);  Surgeon: Ivin Poot, MD;  Location: [place];  Service: Open Heart Surgery;  Laterality: N/A;    Current Outpatient Medications  Medication Sig Dispense Refill  . acetaminophen (TYLENOL) 325 MG tablet Take 650 mg by mouth every 6 (six) hours as needed for mild pain, moderate pain, fever or headache.    Marland Kitchen aspirin EC 81 MG tablet Take 1 tablet (81 mg total) by mouth daily. 90 tablet 3  . atorvastatin (LIPITOR) 10 MG tablet TAKE 1/2 TABLET BY MOUTH EVERY DAY 45 tablet 3  . Calcium Polycarbophil (FIBER LAXATIVE PO) Take 2 tablets by mouth 2 (two) times a day.    . cholecalciferol (VITAMIN D) 1000 UNITS tablet Take  1,000 Units by mouth 2 (two) times daily.     Marland Kitchen esomeprazole (NEXIUM) 40 MG capsule TAKE 1 CAPSULE BY MOUTH EVERY DAY BEFORE BREAKFAST 90 capsule 3  . fluticasone (FLONASE) 50 MCG/ACT nasal spray Place 1-2 sprays into both nostrils daily as needed for rhinitis.    . hydrochlorothiazide (HYDRODIURIL) 25 MG tablet Take 1 tablet (25 mg total) by mouth 3 (three) times a week. 36 tablet 3  . lisinopril (ZESTRIL) 20 MG tablet Take 1 tablet (20 mg total) by mouth  daily. 90 tablet 3  . metoprolol tartrate (LOPRESSOR) 25 MG tablet Take 1 tablet (25 mg total) by mouth 2 (two) times daily. 180 tablet 3  . Multiple Vitamin (MULTIVITAMIN WITH MINERALS) TABS tablet Take 1 tablet by mouth daily.    Marland Kitchen omega-3 acid ethyl esters (LOVAZA) 1 g capsule Take 1 g by mouth 2 (two) times daily.    . potassium chloride (KLOR-CON) 10 MEQ tablet Take 1 tablet (10 mEq total) by mouth 3 (three) times a week. 36 tablet 3   No current facility-administered medications for this visit.    Allergies:   Codeine   Social History:  The patient  reports that she quit smoking about 16 years ago. She quit after 25.00 years of use. She has never used smokeless tobacco. She reports that she does not drink alcohol or use drugs.   Family History:   family history includes Cancer in her brother, father, and mother; Diabetes in her maternal grandmother; Heart disease in her sister; Hypertension in her brother and brother; Lung cancer in her brother; Obesity in her sister; Pancreatic cancer in her father; Stroke in her mother.    Review of Systems: Review of Systems  Constitutional: Negative.   Respiratory: Negative.   Cardiovascular: Negative.   Gastrointestinal: Negative.   Musculoskeletal: Negative.   Neurological: Negative.   Psychiatric/Behavioral: Negative.   All other systems reviewed and are negative.   PHYSICAL EXAM: VS:  BP 110/70 (BP Location: Left Arm, Patient Position: Sitting, Cuff Size: Normal)   Pulse 64   Ht 5\' 8"  (1.727 m)   Wt 168 lb 12 oz (76.5 kg)   SpO2 97%   BMI 25.66 kg/m  , BMI Body mass index is 25.66 kg/m. Constitutional:  oriented to person, place, and time. No distress.  HENT:  Head: Grossly normal Eyes:  no discharge. No scleral icterus.  Neck: No JVD, no carotid bruits  Cardiovascular: Regular rate and rhythm, no murmurs appreciated Pulmonary/Chest: Clear to auscultation bilaterally, no wheezes or rails Abdominal: Soft.  no distension.  no  tenderness.  Musculoskeletal: Normal range of motion Neurological:  normal muscle tone. Coordination normal. No atrophy Skin: Skin warm and dry Psychiatric: normal affect, pleasant  Recent Labs: 11/26/2018: ALT 14; BUN 11; Creatinine, Ser 0.88; Hemoglobin 12.9; Platelets 210.0; Potassium 4.5; Sodium 139; TSH 2.08    Lipid Panel Lab Results  Component Value Date   CHOL 174 11/26/2018   HDL 71.30 11/26/2018   LDLCALC 84 11/26/2018   TRIG 96.0 11/26/2018      Wt Readings from Last 3 Encounters:  05/04/19 168 lb 12 oz (76.5 kg)  12/02/18 164 lb 8 oz (74.6 kg)  11/25/18 167 lb (75.8 kg)     ASSESSMENT AND PLAN:  Nonrheumatic aortic valve stenosis -  echo in 2018, 2020 Aortic valve regurgitation is mild to moderate Tricuspid valve  regurgitation is mild-moderate.  Stable Consider repeat echocardiogram 2022, order placed for reminder  Essential hypertension Blood pressure is  well controlled on today's visit. No changes made to the medications.  HYPERCHOLESTEROLEMIA Stay on lipitor  Preventive care Recommended walking program Strict diet  Disposition:   F/U  12 months   Total encounter time more than 25 minutes  Greater than 50% was spent in counseling and coordination of care with the patient    Orders Placed This Encounter  Procedures  . EKG 12-Lead     Signed, Esmond Plants, M.D., Ph.D. 05/04/2019  [place], [place]

## 2019-05-04 ENCOUNTER — Encounter: Payer: Self-pay | Admitting: Cardiovascular Disease

## 2019-05-04 ENCOUNTER — Ambulatory Visit (INDEPENDENT_AMBULATORY_CARE_PROVIDER_SITE_OTHER): Payer: Medicare PPO | Admitting: Cardiovascular Disease

## 2019-05-04 ENCOUNTER — Other Ambulatory Visit: Payer: Self-pay

## 2019-05-04 VITALS — BP 110/70 | HR 64 | Ht 68.0 in | Wt 168.8 lb

## 2019-05-04 DIAGNOSIS — I10 Essential (primary) hypertension: Secondary | ICD-10-CM

## 2019-05-04 DIAGNOSIS — I350 Nonrheumatic aortic (valve) stenosis: Secondary | ICD-10-CM | POA: Diagnosis not present

## 2019-05-04 DIAGNOSIS — Z952 Presence of prosthetic heart valve: Secondary | ICD-10-CM

## 2019-05-04 DIAGNOSIS — E7800 Pure hypercholesterolemia, unspecified: Secondary | ICD-10-CM

## 2019-05-04 NOTE — Patient Instructions (Signed)

## 2019-05-11 DIAGNOSIS — E7800 Pure hypercholesterolemia, unspecified: Secondary | ICD-10-CM | POA: Diagnosis not present

## 2019-05-11 DIAGNOSIS — K219 Gastro-esophageal reflux disease without esophagitis: Secondary | ICD-10-CM | POA: Diagnosis not present

## 2019-05-11 DIAGNOSIS — F329 Major depressive disorder, single episode, unspecified: Secondary | ICD-10-CM | POA: Diagnosis not present

## 2019-05-12 DIAGNOSIS — M9901 Segmental and somatic dysfunction of cervical region: Secondary | ICD-10-CM | POA: Diagnosis not present

## 2019-05-12 DIAGNOSIS — M9902 Segmental and somatic dysfunction of thoracic region: Secondary | ICD-10-CM | POA: Diagnosis not present

## 2019-05-12 DIAGNOSIS — M62830 Muscle spasm of back: Secondary | ICD-10-CM | POA: Diagnosis not present

## 2019-05-12 DIAGNOSIS — M5033 Other cervical disc degeneration, cervicothoracic region: Secondary | ICD-10-CM | POA: Diagnosis not present

## 2019-06-03 ENCOUNTER — Ambulatory Visit: Payer: Medicare Other | Admitting: Podiatry

## 2019-06-08 DIAGNOSIS — M9902 Segmental and somatic dysfunction of thoracic region: Secondary | ICD-10-CM | POA: Diagnosis not present

## 2019-06-08 DIAGNOSIS — M62830 Muscle spasm of back: Secondary | ICD-10-CM | POA: Diagnosis not present

## 2019-06-08 DIAGNOSIS — M5033 Other cervical disc degeneration, cervicothoracic region: Secondary | ICD-10-CM | POA: Diagnosis not present

## 2019-06-08 DIAGNOSIS — M9901 Segmental and somatic dysfunction of cervical region: Secondary | ICD-10-CM | POA: Diagnosis not present

## 2019-07-06 DIAGNOSIS — M62830 Muscle spasm of back: Secondary | ICD-10-CM | POA: Diagnosis not present

## 2019-07-06 DIAGNOSIS — M9901 Segmental and somatic dysfunction of cervical region: Secondary | ICD-10-CM | POA: Diagnosis not present

## 2019-07-06 DIAGNOSIS — M9902 Segmental and somatic dysfunction of thoracic region: Secondary | ICD-10-CM | POA: Diagnosis not present

## 2019-07-06 DIAGNOSIS — M5033 Other cervical disc degeneration, cervicothoracic region: Secondary | ICD-10-CM | POA: Diagnosis not present

## 2019-08-03 DIAGNOSIS — M5033 Other cervical disc degeneration, cervicothoracic region: Secondary | ICD-10-CM | POA: Diagnosis not present

## 2019-08-03 DIAGNOSIS — M9902 Segmental and somatic dysfunction of thoracic region: Secondary | ICD-10-CM | POA: Diagnosis not present

## 2019-08-03 DIAGNOSIS — M62830 Muscle spasm of back: Secondary | ICD-10-CM | POA: Diagnosis not present

## 2019-08-03 DIAGNOSIS — M9901 Segmental and somatic dysfunction of cervical region: Secondary | ICD-10-CM | POA: Diagnosis not present

## 2019-08-10 ENCOUNTER — Other Ambulatory Visit: Payer: Self-pay | Admitting: Family Medicine

## 2019-08-10 DIAGNOSIS — Z1231 Encounter for screening mammogram for malignant neoplasm of breast: Secondary | ICD-10-CM

## 2019-08-21 ENCOUNTER — Ambulatory Visit
Admission: RE | Admit: 2019-08-21 | Discharge: 2019-08-21 | Disposition: A | Discharge status: Home / Self Care | Payer: Medicare PPO | Source: Ambulatory Visit | Attending: Family Medicine | Admitting: Family Medicine

## 2019-08-21 DIAGNOSIS — Z1231 Encounter for screening mammogram for malignant neoplasm of breast: Secondary | ICD-10-CM | POA: Diagnosis not present

## 2019-08-31 DIAGNOSIS — M62830 Muscle spasm of back: Secondary | ICD-10-CM | POA: Diagnosis not present

## 2019-08-31 DIAGNOSIS — M5033 Other cervical disc degeneration, cervicothoracic region: Secondary | ICD-10-CM | POA: Diagnosis not present

## 2019-08-31 DIAGNOSIS — M9901 Segmental and somatic dysfunction of cervical region: Secondary | ICD-10-CM | POA: Diagnosis not present

## 2019-08-31 DIAGNOSIS — M9902 Segmental and somatic dysfunction of thoracic region: Secondary | ICD-10-CM | POA: Diagnosis not present

## 2019-09-28 DIAGNOSIS — M5033 Other cervical disc degeneration, cervicothoracic region: Secondary | ICD-10-CM | POA: Diagnosis not present

## 2019-09-28 DIAGNOSIS — M9902 Segmental and somatic dysfunction of thoracic region: Secondary | ICD-10-CM | POA: Diagnosis not present

## 2019-09-28 DIAGNOSIS — M9901 Segmental and somatic dysfunction of cervical region: Secondary | ICD-10-CM | POA: Diagnosis not present

## 2019-09-28 DIAGNOSIS — M62830 Muscle spasm of back: Secondary | ICD-10-CM | POA: Diagnosis not present

## 2019-10-20 ENCOUNTER — Other Ambulatory Visit: Payer: Self-pay | Admitting: Cardiovascular Disease

## 2019-10-26 DIAGNOSIS — M9901 Segmental and somatic dysfunction of cervical region: Secondary | ICD-10-CM | POA: Diagnosis not present

## 2019-10-26 DIAGNOSIS — M5033 Other cervical disc degeneration, cervicothoracic region: Secondary | ICD-10-CM | POA: Diagnosis not present

## 2019-10-26 DIAGNOSIS — M62830 Muscle spasm of back: Secondary | ICD-10-CM | POA: Diagnosis not present

## 2019-10-26 DIAGNOSIS — M9902 Segmental and somatic dysfunction of thoracic region: Secondary | ICD-10-CM | POA: Diagnosis not present

## 2019-10-28 ENCOUNTER — Other Ambulatory Visit: Payer: Self-pay | Admitting: Cardiovascular Disease

## 2019-11-10 ENCOUNTER — Ambulatory Visit
Admission: RE | Admit: 2019-11-10 | Discharge: 2019-11-10 | Disposition: A | Discharge status: Home / Self Care | Payer: Medicare PPO | Source: Ambulatory Visit | Attending: Family Medicine | Admitting: Family Medicine

## 2019-11-10 ENCOUNTER — Other Ambulatory Visit: Payer: Self-pay | Admitting: Family Medicine

## 2019-11-10 DIAGNOSIS — R0600 Dyspnea, unspecified: Secondary | ICD-10-CM | POA: Diagnosis not present

## 2019-11-10 DIAGNOSIS — K219 Gastro-esophageal reflux disease without esophagitis: Secondary | ICD-10-CM | POA: Diagnosis not present

## 2019-11-10 DIAGNOSIS — I10 Essential (primary) hypertension: Secondary | ICD-10-CM | POA: Diagnosis not present

## 2019-11-10 DIAGNOSIS — E7800 Pure hypercholesterolemia, unspecified: Secondary | ICD-10-CM | POA: Diagnosis not present

## 2019-11-12 ENCOUNTER — Encounter: Payer: Self-pay | Admitting: Cardiovascular Disease

## 2019-11-24 DIAGNOSIS — M9902 Segmental and somatic dysfunction of thoracic region: Secondary | ICD-10-CM | POA: Diagnosis not present

## 2019-11-24 DIAGNOSIS — M5033 Other cervical disc degeneration, cervicothoracic region: Secondary | ICD-10-CM | POA: Diagnosis not present

## 2019-11-24 DIAGNOSIS — M62830 Muscle spasm of back: Secondary | ICD-10-CM | POA: Diagnosis not present

## 2019-11-24 DIAGNOSIS — M9901 Segmental and somatic dysfunction of cervical region: Secondary | ICD-10-CM | POA: Diagnosis not present

## 2019-11-25 DIAGNOSIS — Z809 Family history of malignant neoplasm, unspecified: Secondary | ICD-10-CM | POA: Diagnosis not present

## 2019-11-25 DIAGNOSIS — Z885 Allergy status to narcotic agent status: Secondary | ICD-10-CM | POA: Diagnosis not present

## 2019-11-25 DIAGNOSIS — I10 Essential (primary) hypertension: Secondary | ICD-10-CM | POA: Diagnosis not present

## 2019-11-25 DIAGNOSIS — Z7982 Long term (current) use of aspirin: Secondary | ICD-10-CM | POA: Diagnosis not present

## 2019-11-25 DIAGNOSIS — K219 Gastro-esophageal reflux disease without esophagitis: Secondary | ICD-10-CM | POA: Diagnosis not present

## 2019-11-25 DIAGNOSIS — Z8249 Family history of ischemic heart disease and other diseases of the circulatory system: Secondary | ICD-10-CM | POA: Diagnosis not present

## 2019-11-25 DIAGNOSIS — E785 Hyperlipidemia, unspecified: Secondary | ICD-10-CM | POA: Diagnosis not present

## 2019-11-25 DIAGNOSIS — Z87891 Personal history of nicotine dependence: Secondary | ICD-10-CM | POA: Diagnosis not present

## 2019-12-22 DIAGNOSIS — M9902 Segmental and somatic dysfunction of thoracic region: Secondary | ICD-10-CM | POA: Diagnosis not present

## 2019-12-22 DIAGNOSIS — M5033 Other cervical disc degeneration, cervicothoracic region: Secondary | ICD-10-CM | POA: Diagnosis not present

## 2019-12-22 DIAGNOSIS — M9901 Segmental and somatic dysfunction of cervical region: Secondary | ICD-10-CM | POA: Diagnosis not present

## 2019-12-22 DIAGNOSIS — M62830 Muscle spasm of back: Secondary | ICD-10-CM | POA: Diagnosis not present

## 2020-01-12 DIAGNOSIS — M9902 Segmental and somatic dysfunction of thoracic region: Secondary | ICD-10-CM | POA: Diagnosis not present

## 2020-01-12 DIAGNOSIS — M9901 Segmental and somatic dysfunction of cervical region: Secondary | ICD-10-CM | POA: Diagnosis not present

## 2020-01-12 DIAGNOSIS — M62830 Muscle spasm of back: Secondary | ICD-10-CM | POA: Diagnosis not present

## 2020-01-12 DIAGNOSIS — M5033 Other cervical disc degeneration, cervicothoracic region: Secondary | ICD-10-CM | POA: Diagnosis not present

## 2020-01-16 ENCOUNTER — Other Ambulatory Visit: Payer: Self-pay | Admitting: Family Medicine

## 2020-01-18 DIAGNOSIS — U071 COVID-19: Secondary | ICD-10-CM

## 2020-01-18 HISTORY — DX: COVID-19: U07.1

## 2020-01-29 DIAGNOSIS — Z20822 Contact with and (suspected) exposure to covid-19: Secondary | ICD-10-CM | POA: Diagnosis not present

## 2020-01-29 DIAGNOSIS — Z03818 Encounter for observation for suspected exposure to other biological agents ruled out: Secondary | ICD-10-CM | POA: Diagnosis not present

## 2020-01-29 DIAGNOSIS — U071 COVID-19: Secondary | ICD-10-CM | POA: Diagnosis not present

## 2020-01-31 ENCOUNTER — Other Ambulatory Visit: Payer: Self-pay | Admitting: Cardiovascular Disease

## 2020-02-03 ENCOUNTER — Inpatient Hospital Stay (HOSPITAL_COMMUNITY)
Admission: EM | Admit: 2020-02-03 | Discharge: 2020-02-08 | DRG: 177 | Disposition: A | Discharge status: Home / Self Care | Payer: Medicare PPO | Attending: Internal Medicine | Admitting: Internal Medicine

## 2020-02-03 ENCOUNTER — Emergency Department (HOSPITAL_COMMUNITY): Payer: Medicare PPO

## 2020-02-03 ENCOUNTER — Other Ambulatory Visit: Payer: Self-pay

## 2020-02-03 DIAGNOSIS — Z823 Family history of stroke: Secondary | ICD-10-CM

## 2020-02-03 DIAGNOSIS — R0602 Shortness of breath: Secondary | ICD-10-CM

## 2020-02-03 DIAGNOSIS — Z800 Family history of malignant neoplasm of digestive organs: Secondary | ICD-10-CM | POA: Diagnosis not present

## 2020-02-03 DIAGNOSIS — E559 Vitamin D deficiency, unspecified: Secondary | ICD-10-CM | POA: Diagnosis present

## 2020-02-03 DIAGNOSIS — Z833 Family history of diabetes mellitus: Secondary | ICD-10-CM

## 2020-02-03 DIAGNOSIS — Z8249 Family history of ischemic heart disease and other diseases of the circulatory system: Secondary | ICD-10-CM

## 2020-02-03 DIAGNOSIS — J1282 Pneumonia due to coronavirus disease 2019: Secondary | ICD-10-CM | POA: Diagnosis not present

## 2020-02-03 DIAGNOSIS — E871 Hypo-osmolality and hyponatremia: Secondary | ICD-10-CM | POA: Diagnosis not present

## 2020-02-03 DIAGNOSIS — R55 Syncope and collapse: Secondary | ICD-10-CM | POA: Diagnosis not present

## 2020-02-03 DIAGNOSIS — Z7982 Long term (current) use of aspirin: Secondary | ICD-10-CM | POA: Diagnosis not present

## 2020-02-03 DIAGNOSIS — Z885 Allergy status to narcotic agent status: Secondary | ICD-10-CM | POA: Diagnosis not present

## 2020-02-03 DIAGNOSIS — J9601 Acute respiratory failure with hypoxia: Secondary | ICD-10-CM | POA: Diagnosis present

## 2020-02-03 DIAGNOSIS — Z953 Presence of xenogenic heart valve: Secondary | ICD-10-CM

## 2020-02-03 DIAGNOSIS — D61818 Other pancytopenia: Secondary | ICD-10-CM | POA: Diagnosis not present

## 2020-02-03 DIAGNOSIS — Z90710 Acquired absence of both cervix and uterus: Secondary | ICD-10-CM | POA: Diagnosis not present

## 2020-02-03 DIAGNOSIS — I10 Essential (primary) hypertension: Secondary | ICD-10-CM | POA: Diagnosis present

## 2020-02-03 DIAGNOSIS — R197 Diarrhea, unspecified: Secondary | ICD-10-CM | POA: Diagnosis not present

## 2020-02-03 DIAGNOSIS — R0902 Hypoxemia: Secondary | ICD-10-CM | POA: Diagnosis not present

## 2020-02-03 DIAGNOSIS — K219 Gastro-esophageal reflux disease without esophagitis: Secondary | ICD-10-CM | POA: Diagnosis present

## 2020-02-03 DIAGNOSIS — J439 Emphysema, unspecified: Secondary | ICD-10-CM | POA: Diagnosis not present

## 2020-02-03 DIAGNOSIS — Z79899 Other long term (current) drug therapy: Secondary | ICD-10-CM | POA: Diagnosis not present

## 2020-02-03 DIAGNOSIS — E861 Hypovolemia: Secondary | ICD-10-CM | POA: Diagnosis present

## 2020-02-03 DIAGNOSIS — R4020 Unspecified coma: Secondary | ICD-10-CM | POA: Diagnosis not present

## 2020-02-03 DIAGNOSIS — E876 Hypokalemia: Secondary | ICD-10-CM | POA: Diagnosis present

## 2020-02-03 DIAGNOSIS — Z86010 Personal history of colonic polyps: Secondary | ICD-10-CM

## 2020-02-03 DIAGNOSIS — E785 Hyperlipidemia, unspecified: Secondary | ICD-10-CM | POA: Diagnosis not present

## 2020-02-03 DIAGNOSIS — I959 Hypotension, unspecified: Secondary | ICD-10-CM | POA: Diagnosis not present

## 2020-02-03 DIAGNOSIS — R112 Nausea with vomiting, unspecified: Secondary | ICD-10-CM | POA: Diagnosis not present

## 2020-02-03 DIAGNOSIS — R7612 Nonspecific reaction to cell mediated immunity measurement of gamma interferon antigen response without active tuberculosis: Secondary | ICD-10-CM | POA: Diagnosis not present

## 2020-02-03 DIAGNOSIS — Z801 Family history of malignant neoplasm of trachea, bronchus and lung: Secondary | ICD-10-CM | POA: Diagnosis not present

## 2020-02-03 DIAGNOSIS — U071 COVID-19: Principal | ICD-10-CM

## 2020-02-03 DIAGNOSIS — Z23 Encounter for immunization: Secondary | ICD-10-CM | POA: Diagnosis present

## 2020-02-03 DIAGNOSIS — Z87891 Personal history of nicotine dependence: Secondary | ICD-10-CM | POA: Diagnosis not present

## 2020-02-03 DIAGNOSIS — J189 Pneumonia, unspecified organism: Secondary | ICD-10-CM | POA: Diagnosis not present

## 2020-02-03 DIAGNOSIS — E872 Acidosis: Secondary | ICD-10-CM | POA: Diagnosis present

## 2020-02-03 DIAGNOSIS — E860 Dehydration: Secondary | ICD-10-CM | POA: Diagnosis present

## 2020-02-03 LAB — COMPREHENSIVE METABOLIC PANEL
ALT: 21 U/L (ref 0–44)
AST: 32 U/L (ref 15–41)
Albumin: 2.2 g/dL — ABNORMAL LOW (ref 3.5–5.0)
Alkaline Phosphatase: 69 U/L (ref 38–126)
Anion gap: 9 (ref 5–15)
BUN: 10 mg/dL (ref 8–23)
CO2: 15 mmol/L — ABNORMAL LOW (ref 22–32)
Calcium: 6.6 mg/dL — ABNORMAL LOW (ref 8.9–10.3)
Chloride: 110 mmol/L (ref 98–111)
Creatinine, Ser: 0.81 mg/dL (ref 0.44–1.00)
GFR, Estimated: >60 mL/min (ref >60)
Glucose, Bld: 94 mg/dL (ref 70–99)
Potassium: 3 mmol/L — ABNORMAL LOW (ref 3.5–5.1)
Sodium: 134 mmol/L — ABNORMAL LOW (ref 135–145)
Total Bilirubin: 0.5 mg/dL (ref 0.3–1.2)
Total Protein: 4.1 g/dL — ABNORMAL LOW (ref 6.5–8.1)

## 2020-02-03 LAB — BLOOD GAS, VENOUS
Acid-Base Excess: 0.4 mmol/L (ref 0.0–2.0)
Bicarbonate: 25.2 mmol/L (ref 20.0–28.0)
Drawn by: 1390
FIO2: 21
O2 Saturation: 41.3 %
Patient temperature: 37
pCO2, Ven: 45.9 mmHg (ref 44.0–60.0)
pH, Ven: 7.359 (ref 7.250–7.430)
pO2, Ven: <31 mmHg — CL (ref 32.0–45.0)

## 2020-02-03 LAB — I-STAT VENOUS BLOOD GAS, ED
Acid-Base Excess: 0 mmol/L (ref 0.0–2.0)
Bicarbonate: 26 mmol/L (ref 20.0–28.0)
Calcium, Ion: 1.21 mmol/L (ref 1.15–1.40)
HCT: 38 % (ref 36.0–46.0)
Hemoglobin: 12.9 g/dL (ref 12.0–15.0)
O2 Saturation: 45 %
Potassium: 4.1 mmol/L (ref 3.5–5.1)
Sodium: 133 mmol/L — ABNORMAL LOW (ref 135–145)
TCO2: 27 mmol/L (ref 22–32)
pCO2, Ven: 46.6 mmHg (ref 44.0–60.0)
pH, Ven: 7.355 (ref 7.250–7.430)
pO2, Ven: 26 mmHg — CL (ref 32.0–45.0)

## 2020-02-03 LAB — CBC WITH DIFFERENTIAL/PLATELET
Abs Immature Granulocytes: 0.03 10*3/uL (ref 0.00–0.07)
Basophils Absolute: 0 10*3/uL (ref 0.0–0.1)
Basophils Relative: 1 %
Eosinophils Absolute: 0 10*3/uL (ref 0.0–0.5)
Eosinophils Relative: 1 %
HCT: 30.3 % — ABNORMAL LOW (ref 36.0–46.0)
Hemoglobin: 9.5 g/dL — ABNORMAL LOW (ref 12.0–15.0)
Immature Granulocytes: 2 %
Lymphocytes Relative: 18 %
Lymphs Abs: 0.3 10*3/uL — ABNORMAL LOW (ref 0.7–4.0)
MCH: 30.4 pg (ref 26.0–34.0)
MCHC: 31.4 g/dL (ref 30.0–36.0)
MCV: 97.1 fL (ref 80.0–100.0)
Monocytes Absolute: 0.2 10*3/uL (ref 0.1–1.0)
Monocytes Relative: 9 %
Neutro Abs: 1.2 10*3/uL — ABNORMAL LOW (ref 1.7–7.7)
Neutrophils Relative %: 69 %
Platelets: 83 10*3/uL — ABNORMAL LOW (ref 150–400)
RBC: 3.12 MIL/uL — ABNORMAL LOW (ref 3.87–5.11)
RDW: 13.1 % (ref 11.5–15.5)
WBC: 1.8 10*3/uL — ABNORMAL LOW (ref 4.0–10.5)
nRBC: 0 % (ref 0.0–0.2)

## 2020-02-03 LAB — LACTIC ACID, PLASMA
Lactic Acid, Venous: 0.9 mmol/L (ref 0.5–1.9)
Lactic Acid, Venous: 1.5 mmol/L (ref 0.5–1.9)

## 2020-02-03 LAB — FIBRINOGEN: Fibrinogen: 500 mg/dL — ABNORMAL HIGH (ref 210–475)

## 2020-02-03 LAB — RESPIRATORY PANEL BY RT PCR (FLU A&B, COVID)
Influenza A by PCR: NEGATIVE
Influenza B by PCR: NEGATIVE
SARS Coronavirus 2 by RT PCR: POSITIVE — AB

## 2020-02-03 LAB — MAGNESIUM: Magnesium: 1.7 mg/dL (ref 1.7–2.4)

## 2020-02-03 LAB — FERRITIN: Ferritin: 184 ng/mL (ref 11–307)

## 2020-02-03 LAB — TRIGLYCERIDES: Triglycerides: 50 mg/dL (ref <150)

## 2020-02-03 LAB — PROCALCITONIN: Procalcitonin: <0.1 ng/mL

## 2020-02-03 LAB — C-REACTIVE PROTEIN: CRP: 3.5 mg/dL — ABNORMAL HIGH (ref <1.0)

## 2020-02-03 LAB — D-DIMER, QUANTITATIVE: D-Dimer, Quant: 1.04 ug/mL-FEU — ABNORMAL HIGH (ref 0.00–0.50)

## 2020-02-03 LAB — LACTATE DEHYDROGENASE: LDH: 176 U/L (ref 98–192)

## 2020-02-03 MED ORDER — EPINEPHRINE 0.3 MG/0.3ML IJ SOAJ
0.3000 mg | Freq: Once | INTRAMUSCULAR | Status: DC | PRN
  Filled 2020-02-03: qty 0.6

## 2020-02-03 MED ORDER — HYDRALAZINE HCL 25 MG PO TABS: 25.0000 mg | ORAL_TABLET | Freq: Four times a day (QID) | ORAL | Status: DC | PRN

## 2020-02-03 MED ORDER — METOPROLOL TARTRATE 25 MG PO TABS
25.0000 mg | ORAL_TABLET | Freq: Two times a day (BID) | ORAL | Status: DC
  Administered 2020-02-03 – 2020-02-07 (×9): 25 mg via ORAL
  Filled 2020-02-03 (×8): qty 1

## 2020-02-03 MED ORDER — CALCIUM GLUCONATE-NACL 1-0.675 GM/50ML-% IV SOLN
1.0000 g | Freq: Once | INTRAVENOUS | Status: AC
  Administered 2020-02-03: 1000 mg via INTRAVENOUS
  Filled 2020-02-03: qty 50

## 2020-02-03 MED ORDER — ADULT MULTIVITAMIN W/MINERALS CH
1.0000 | ORAL_TABLET | Freq: Every day | ORAL | Status: DC
  Administered 2020-02-04 – 2020-02-08 (×5): 1 via ORAL
  Filled 2020-02-03 (×5): qty 1

## 2020-02-03 MED ORDER — SODIUM BICARBONATE 8.4 % IV SOLN
INTRAVENOUS | Status: DC
  Filled 2020-02-03 (×4): qty 850

## 2020-02-03 MED ORDER — SODIUM CHLORIDE 0.9 % IV SOLN
Freq: Once | INTRAVENOUS | Status: AC
  Filled 2020-02-03: qty 20

## 2020-02-03 MED ORDER — OMEGA-3-ACID ETHYL ESTERS 1 G PO CAPS
1.0000 g | ORAL_CAPSULE | Freq: Two times a day (BID) | ORAL | Status: DC
  Administered 2020-02-03 – 2020-02-08 (×10): 1 g via ORAL
  Filled 2020-02-03 (×11): qty 1

## 2020-02-03 MED ORDER — ATORVASTATIN CALCIUM 10 MG PO TABS
5.0000 mg | ORAL_TABLET | Freq: Every day | ORAL | Status: DC
  Administered 2020-02-04 – 2020-02-08 (×5): 5 mg via ORAL
  Filled 2020-02-03 (×5): qty 1

## 2020-02-03 MED ORDER — ONDANSETRON HCL 4 MG/2ML IJ SOLN
4.0000 mg | Freq: Four times a day (QID) | INTRAMUSCULAR | Status: DC | PRN
  Administered 2020-02-04: 4 mg via INTRAVENOUS
  Filled 2020-02-03: qty 2

## 2020-02-03 MED ORDER — ASPIRIN EC 81 MG PO TBEC
81.0000 mg | DELAYED_RELEASE_TABLET | Freq: Every day | ORAL | Status: DC
  Administered 2020-02-04 – 2020-02-08 (×5): 81 mg via ORAL
  Filled 2020-02-03 (×5): qty 1

## 2020-02-03 MED ORDER — PANTOPRAZOLE SODIUM 40 MG PO TBEC
40.0000 mg | DELAYED_RELEASE_TABLET | Freq: Every day | ORAL | Status: DC
  Administered 2020-02-04 – 2020-02-08 (×5): 40 mg via ORAL
  Filled 2020-02-03 (×5): qty 1

## 2020-02-03 MED ORDER — DIPHENHYDRAMINE HCL 50 MG/ML IJ SOLN: 50.0000 mg | Freq: Once | INTRAMUSCULAR | Status: DC | PRN

## 2020-02-03 MED ORDER — LOPERAMIDE HCL 2 MG PO CAPS
2.0000 mg | ORAL_CAPSULE | ORAL | Status: DC | PRN
  Administered 2020-02-04: 2 mg via ORAL
  Filled 2020-02-03: qty 1

## 2020-02-03 MED ORDER — POTASSIUM CHLORIDE CRYS ER 20 MEQ PO TBCR
40.0000 meq | EXTENDED_RELEASE_TABLET | Freq: Once | ORAL | Status: AC
  Administered 2020-02-03: 40 meq via ORAL
  Filled 2020-02-03: qty 2

## 2020-02-03 MED ORDER — ALBUTEROL SULFATE HFA 108 (90 BASE) MCG/ACT IN AERS
2.0000 | INHALATION_SPRAY | Freq: Once | RESPIRATORY_TRACT | Status: DC | PRN
  Filled 2020-02-03: qty 6.7

## 2020-02-03 MED ORDER — LISINOPRIL 20 MG PO TABS
20.0000 mg | ORAL_TABLET | Freq: Every day | ORAL | Status: DC
  Administered 2020-02-04 – 2020-02-08 (×5): 20 mg via ORAL
  Filled 2020-02-03 (×6): qty 1

## 2020-02-03 MED ORDER — METHYLPREDNISOLONE SODIUM SUCC 125 MG IJ SOLR: 125.0000 mg | Freq: Once | INTRAMUSCULAR | Status: DC | PRN

## 2020-02-03 MED ORDER — ONDANSETRON HCL 4 MG PO TABS: 4.0000 mg | ORAL_TABLET | Freq: Four times a day (QID) | ORAL | Status: DC | PRN

## 2020-02-03 MED ORDER — FLUTICASONE PROPIONATE 50 MCG/ACT NA SUSP
1.0000 | Freq: Every day | NASAL | Status: DC | PRN
  Filled 2020-02-03: qty 16

## 2020-02-03 MED ORDER — ENOXAPARIN SODIUM 40 MG/0.4ML ~~LOC~~ SOLN
40.0000 mg | SUBCUTANEOUS | Status: DC
  Administered 2020-02-03 – 2020-02-07 (×5): 40 mg via SUBCUTANEOUS
  Filled 2020-02-03 (×5): qty 0.4

## 2020-02-03 MED ORDER — SODIUM CHLORIDE 0.9 % IV SOLN: INTRAVENOUS | Status: DC | PRN

## 2020-02-03 MED ORDER — VITAMIN D 25 MCG (1000 UNIT) PO TABS
1000.0000 [IU] | ORAL_TABLET | Freq: Two times a day (BID) | ORAL | Status: DC
  Administered 2020-02-03 – 2020-02-08 (×10): 1000 [IU] via ORAL
  Filled 2020-02-03 (×10): qty 1

## 2020-02-03 MED ORDER — ACETAMINOPHEN 325 MG PO TABS
650.0000 mg | ORAL_TABLET | Freq: Four times a day (QID) | ORAL | Status: DC | PRN
  Administered 2020-02-03 – 2020-02-07 (×5): 650 mg via ORAL
  Filled 2020-02-03 (×5): qty 2

## 2020-02-03 MED ORDER — FAMOTIDINE IN NACL 20-0.9 MG/50ML-% IV SOLN
20.0000 mg | Freq: Once | INTRAVENOUS | Status: AC | PRN
  Administered 2020-02-03: 20 mg via INTRAVENOUS
  Filled 2020-02-03: qty 50

## 2020-02-03 MED ORDER — CALCIUM POLYCARBOPHIL 625 MG PO TABS
1250.0000 mg | ORAL_TABLET | Freq: Every day | ORAL | Status: DC
  Administered 2020-02-03 – 2020-02-08 (×6): 1250 mg via ORAL
  Filled 2020-02-03 (×7): qty 2

## 2020-02-03 NOTE — ED Notes (Signed)
Please call daughter, Clide Cliff [phone] with updates.

## 2020-02-03 NOTE — ED Triage Notes (Signed)
PT BIB [facility] after pt husband called to report pt having a syncopal episode with + LOC.  According to EMS, pt tested Covid + on 01/29/20 and has had diarrhea and N/V x 5 days. Pt has hx of HTN, open heart surgery. PT was given 4 mg Zofran enroute and 500 cc bolus.  B/P 134/60 100% RA 02 HR 73 RR 20

## 2020-02-03 NOTE — H&P (Signed)
History and Physical    Heidi Middleton MVH:[id number] DOB: 05/28/1945 DOA: 02/03/2020  PCP: Alroy Dust, L.Marlou Sa, MD (Confirm with patient/family/NH records and if not entered, this has to be entered at [place] point of entry) Patient coming from: Home  I have personally briefly reviewed patient's old medical records in [place]  Chief Complaint: Diarrhea  HPI: Heidi Middleton is a 74 y.o. 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  Patient not vaccinated for COVID, started to have strong bilateral frontal headache, frequent nauseous and vomiting of stomach content loose BM 6 days ago.  Also had episode of fever and chills at home, controlled tested for Covid at [facility] in [place] 5 days ago, come back Covid positive.  Then she called her PCP who recommend she takes a lot of fluid and Tylenol for headache and Zofran for nausea.  For last 2 days, she has not been able to eat or drink anything because of significant worsening of her GI symptoms including nausea vomiting and more frequent diarrhea.  She had 3 episodes of watery diarrhea this morning for a day.  No abdominal pain.  She has some dry cough but she attributed that to her frequent vomiting.  This morning she felt " no strength at all, lightheadedness", she then passed out about 5 minutes.  ED Course: Chest x-ray showed no acute infiltrates, normal WBC, potassium 3.0, bicarb 15, creatinine 0.8.  Review of Systems: As per HPI otherwise 14 point review of systems negative.    Past Medical History:  Diagnosis Date  . Adenomatous colon polyp 05/1985  . Allergy   . Aortic stenosis   . Cataract   . Cholelithiasis   . Diverticulosis   . GERD (gastroesophageal reflux disease)   . Headache    migraines prior to hysterectomy  . Heart murmur   . Hyperlipidemia   . Hypertension   . Osteopenia     . PONV (postoperative nausea and vomiting)    nausea  . Shingles     Past Surgical History:  Procedure Laterality Date  . ABDOMINAL HYSTERECTOMY    . AORTIC VALVE REPLACEMENT N/A 08/16/2015   Procedure: AORTIC VALVE REPLACEMENT (AVR);  Surgeon: Ivin Poot, MD;  Location: [facility];  Service: Open Heart Surgery;  Laterality: N/A;  . APPENDECTOMY     taken out with hysterectomy  . BREAST EXCISIONAL BIOPSY Right 1990   EXCISIONAL - NEG  . CARDIAC CATHETERIZATION Bilateral 08/04/2015   Procedure: Right/Left Heart Cath and Coronary Angiography;  Surgeon: Wellington Hampshire, MD;  Location: [facility];  Service: Cardiovascular;  Laterality: Bilateral;  . CATARACT EXTRACTION, BILATERAL  2020  . ERCP N/A 10/31/2016   Procedure: ENDOSCOPIC RETROGRADE CHOLANGIOPANCREATOGRAPHY (ERCP);  Surgeon: Ladene Artist, MD;  Location: [facility] ENDOSCOPY;  Service: Endoscopy;  Laterality: N/A;  . EYE SURGERY N/A    detatched retina- "not sure which eye"  . KNEE SURGERY  10/2005   calcinosis - in office procedure  . TEE WITHOUT CARDIOVERSION N/A 08/16/2015   Procedure: TRANSESOPHAGEAL ECHOCARDIOGRAM (TEE);  Surgeon: Ivin Poot, MD;  Location: [facility];  Service: Open Heart Surgery;  Laterality: N/A;     reports that she quit smoking about 17 years ago. She quit after 25.00 years of use. She has never used smokeless tobacco. She reports that she does not drink alcohol and does not use drugs.  Allergies  Allergen Reactions  . Codeine Nausea And Vomiting    Family History  Problem Relation Age of Onset  . Cancer Mother        lung  . Stroke Mother   . Cancer Father        pancreatic  . Pancreatic cancer Father   . Heart disease Sister   . Hypertension Brother   . Cancer Brother   . Diabetes Maternal Grandmother   . Hypertension Brother   . Obesity Sister   . Lung cancer Brother   . Breast cancer Neg Hx   . Colon cancer Neg Hx   . Esophageal cancer Neg Hx   . Rectal cancer Neg Hx   .  Stomach cancer Neg Hx      Prior to Admission medications   Medication Sig Start Date End Date Taking? Authorizing Provider  acetaminophen (TYLENOL) 325 MG tablet Take 650 mg by mouth every 6 (six) hours as needed for mild pain, moderate pain, fever or headache.   Yes [provider]  aspirin EC 81 MG tablet Take 1 tablet (81 mg total) by mouth daily. 11/03/18  Yes Gollan, Kathlene November, MD  atorvastatin (LIPITOR) 10 MG tablet TAKE 1/2 TABLET BY MOUTH EVERY DAY. NEEDS OFFICE VISIT Patient taking differently: Take 5 mg by mouth daily. TAKE 1/2 TABLET BY MOUTH EVERY DAY. NEEDS OFFICE VISIT 01/16/20  Yes Tower, Wynelle Fanny, MD  Calcium Polycarbophil (FIBER LAXATIVE PO) Take 2 tablets by mouth 2 (two) times a day.   Yes [provider]  cholecalciferol (VITAMIN D) 1000 UNITS tablet Take 1,000 Units by mouth 2 (two) times daily.    Yes [provider]  esomeprazole (NEXIUM) 40 MG capsule TAKE 1 CAPSULE BY MOUTH EVERY DAY BEFORE BREAKFAST Patient taking differently: Take 40 mg by mouth daily before breakfast. TAKE 1 CAPSULE BY MOUTH EVERY DAY BEFORE BREAKFAST 12/02/18  Yes Tower, Wynelle Fanny, MD  fluticasone (FLONASE) 50 MCG/ACT nasal spray Place 1-2 sprays into both nostrils daily as needed for rhinitis.   Yes [provider]  hydrochlorothiazide (HYDRODIURIL) 25 MG tablet Take 1 tablet (25 mg total) by mouth 3 (three) times a week. 02/27/19 02/03/20 Yes Gollan, Kathlene November, MD  lisinopril (ZESTRIL) 20 MG tablet TAKE 1 TABLET BY MOUTH EVERY DAY Patient taking differently: Take 20 mg by mouth daily.  10/20/19  Yes Gollan, Kathlene November, MD  metoprolol tartrate (LOPRESSOR) 25 MG tablet TAKE 1 TABLET BY MOUTH TWICE A DAY Patient taking differently: Take 25 mg by mouth 2 (two) times daily.  10/28/19  Yes Gollan, Kathlene November, MD  Multiple Vitamin (MULTIVITAMIN WITH MINERALS) TABS tablet Take 1 tablet by mouth daily.   Yes [provider]  omega-3 acid ethyl esters (LOVAZA) 1 g capsule  Take 1 g by mouth 2 (two) times daily.   Yes [provider]  potassium chloride (KLOR-CON) 10 MEQ tablet Take 1 tablet (10 mEq total) by mouth 3 (three) times a week. Please call office to schedule appointment for February. 02/01/20 05/01/20 Yes Minna Merritts, MD    Physical Exam: Vitals:   02/03/20 1715 02/03/20 1730 02/03/20 1745 02/03/20 1800  BP: (!) 151/62 (!) 154/62 (!) 149/80 (!) 150/64  Pulse: 73 73 76 75  Resp: 15 16 (!) 21 15  Temp:      SpO2: 97% 96% 99% 97%  Weight:      Height:        Constitutional: NAD, calm, comfortable Vitals:   02/03/20 1715 02/03/20  1730 02/03/20 1745 02/03/20 1800  BP: (!) 151/62 (!) 154/62 (!) 149/80 (!) 150/64  Pulse: 73 73 76 75  Resp: 15 16 (!) 21 15  Temp:      SpO2: 97% 96% 99% 97%  Weight:      Height:       Eyes: PERRL, lids and conjunctivae normal ENMT: Mucous membranes are dry. Posterior pharynx clear of any exudate or lesions.Normal dentition.  Neck: normal, supple, no masses, no thyromegaly Respiratory: clear to auscultation bilaterally, no wheezing, no crackles. Normal respiratory effort. No accessory muscle use.  Cardiovascular: Regular rate and rhythm, no murmurs / rubs / gallops. No extremity edema. 2+ pedal pulses. No carotid bruits.  Abdomen: no tenderness, no masses palpated. No hepatosplenomegaly. Bowel sounds positive.  Musculoskeletal: no clubbing / cyanosis. No joint deformity upper and lower extremities. Good ROM, no contractures. Normal muscle tone.  Skin: no rashes, lesions, ulcers. No induration Neurologic: CN 2-12 grossly intact. Sensation intact, DTR normal. Strength 5/5 in all 4.  Psychiatric: Normal judgment and insight. Alert and oriented x 3. Normal mood.     Labs on Admission: I have personally reviewed following labs and imaging studies  CBC: Recent Labs  Lab 02/03/20 1410 02/03/20 1803  WBC 1.8*  --   NEUTROABS 1.2*  --   HGB 9.5* 12.9  HCT 30.3* 38.0  MCV 97.1  --   PLT 83*  --      Basic Metabolic Panel: Recent Labs  Lab 02/03/20 1410 02/03/20 1803  NA 134* 133*  K 3.0* 4.1  CL 110  --   CO2 15*  --   GLUCOSE 94  --   BUN 10  --   CREATININE 0.81  --   CALCIUM 6.6*  --    GFR: Estimated Creatinine Clearance: 63.4 mL/min (by C-G formula based on SCr of 0.81 mg/dL). Liver Function Tests: Recent Labs  Lab 02/03/20 1410  AST 32  ALT 21  ALKPHOS 69  BILITOT 0.5  PROT 4.1*  ALBUMIN 2.2*   No results for input(s): LIPASE, AMYLASE in the last 168 hours. No results for input(s): AMMONIA in the last 168 hours. Coagulation Profile: No results for input(s): INR, PROTIME in the last 168 hours. Cardiac Enzymes: No results for input(s): CKTOTAL, CKMB, CKMBINDEX, TROPONINI in the last 168 hours. BNP (last 3 results) No results for input(s): PROBNP in the last 8760 hours. HbA1C: No results for input(s): HGBA1C in the last 72 hours. CBG: No results for input(s): GLUCAP in the last 168 hours. Lipid Profile: Recent Labs    02/03/20 1410  TRIG 50   Thyroid Function Tests: No results for input(s): TSH, T4TOTAL, FREET4, T3FREE, THYROIDAB in the last 72 hours. Anemia Panel: Recent Labs    02/03/20 1500  FERRITIN 184   Urine analysis:    Component Value Date/Time   COLORURINE YELLOW 09/23/2016 0739   APPEARANCEUR CLEAR 09/23/2016 0739   LABSPEC >1.046 (H) 09/23/2016 0739   PHURINE 5.0 09/23/2016 0739   GLUCOSEU NEGATIVE 09/23/2016 0739   HGBUR NEGATIVE 09/23/2016 0739   BILIRUBINUR NEGATIVE 09/23/2016 0739   KETONESUR NEGATIVE 09/23/2016 0739   PROTEINUR NEGATIVE 09/23/2016 0739   NITRITE NEGATIVE 09/23/2016 0739   LEUKOCYTESUR NEGATIVE 09/23/2016 0739    Radiological Exams on Admission: DG Chest Port 1 View  Result Date: 02/03/2020 CLINICAL DATA:  COVID-19 positive EXAM: PORTABLE CHEST 1 VIEW COMPARISON:  November 10, 2019 FINDINGS: Lungs are clear. Heart size and pulmonary vascularity are normal. Patient is status post  median sternotomy. No  adenopathy. No bone lesions. IMPRESSION: Lungs clear.  Heart size normal.  No adenopathy. Electronically Signed   By: Lowella Grip III M.D.   On: 02/03/2020 15:06    EKG: Independently reviewed.  LVH  Assessment/Plan Active Problems:   COVID-19  (please populate well all problems here in Problem List. (For example, if patient is on BP meds at home and you resume or decide to hold them, it is a problem that needs to be her. Same for CAD, COPD, HLD and so on)  Syncope -Likely vasovagal from severe dehydration/hypovolemia from worsening of her GI symptoms and poor oral intake. -Start hydration and reevaluate orthostatic vital signs tomorrow -Given the history she had similar chest symptoms, and her most recent echo was done last year showing normal structural aortic postencephalitic valve, repeat echo this time. -PT evaluation  Covid infection -No significant respiratory symptoms, not vaccinated, given her significant progressive chest symptoms, will give her monoclonal antibody infusion -Today's Covid labs showed elevated D-dimer, as she has no complaint of chest pain or shortness of breath, will trend D-dimer tomorrow and decide further management plan  Pancytopenia -Appears to be related to acute viral syndrome, repeat CBC tomorrow  Hypokalemia -Replace and recheck, also send magnesium level -Hold HCTZ  Acute non-anion gap metabolic acidosis -Related to frequent diarrhea, start Imodium and start bicarb drip.  HTN -Continue lisinopril, hold HCTZ, continue beta-blocker -As needed hydralazine   DVT prophylaxis: Lovenox Code Status: Full code Family Communication: None at bedside Disposition Plan: Covid infection with significant GI loss, syncope, acidosis and hypokalemia, more than 2 midnight hospital stay to stabilize. Consults called: None Admission status: Telemetry admission   Lequita Halt MD [facility] Pager [phone]  02/03/2020, 7:14 PM

## 2020-02-03 NOTE — ED Provider Notes (Signed)
[facility] EMERGENCY DEPARTMENT Provider Note   CSN: [id number] Arrival date & time: 02/03/20  1344     History Chief Complaint  Patient presents with  . Covid Positive    n/v, diarrhea    Heidi Middleton is a 74 y.o. female.  Pt reports she had diarrhea for several days.  Pt reports she and her husband tested positive for covid.  Pt has not had immunizations.  Pt's husband reports pt passed out at home today.  Pt reported to have been unresponsive for 5 minutes.    The history is provided by the patient. No language interpreter was used.  Loss of Consciousness Episode history:  Single Most recent episode:  Today Timing:  Constant Progression:  Partially resolved Chronicity:  New Context: not with normal activity   Context comment:  Diarrhea Witnessed: no   Relieved by:  Nothing Worsened by:  Nothing Ineffective treatments:  None tried Associated symptoms: no chest pain   Risk factors: no seizures        Past Medical History:  Diagnosis Date  . Adenomatous colon polyp 05/1985  . Allergy   . Aortic stenosis   . Cataract   . Cholelithiasis   . Diverticulosis   . GERD (gastroesophageal reflux disease)   . Headache    migraines prior to hysterectomy  . Heart murmur   . Hyperlipidemia   . Hypertension   . Osteopenia   . PONV (postoperative nausea and vomiting)    nausea  . Shingles     Patient Active Problem List   Diagnosis Date Noted  . Toe dislocation, left, subsequent encounter 05/14/2018  . Elevated glucose level 06/19/2017  . Colitis 09/25/2016  . Estrogen deficiency 06/26/2016  . Aortic valve stenosis   . Routine general medical examination at a health care facility 06/24/2015  . Heart murmur, systolic 28/41/3244  . Need for hepatitis C screening test 06/20/2015  . Medicare annual wellness visit, subsequent 06/20/2015  . Encounter for Medicare annual wellness exam 06/13/2012  . Osteopenia 01/31/2010  . GERD 12/21/2009  .  HYPERCHOLESTEROLEMIA 10/09/2006  . Quit smoking 10/09/2006  . GLAUCOMA 10/09/2006  . Essential hypertension 10/09/2006  . 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 OSTEOARTHRITIS 10/09/2006  . COLONIC POLYPS, HX OF 10/09/2006    Past Surgical History:  Procedure Laterality Date  . ABDOMINAL HYSTERECTOMY    . AORTIC VALVE REPLACEMENT N/A 08/16/2015   Procedure: AORTIC VALVE REPLACEMENT (AVR);  Surgeon: Ivin Poot, MD;  Location: [facility];  Service: Open Heart Surgery;  Laterality: N/A;  . APPENDECTOMY     taken out with hysterectomy  . BREAST EXCISIONAL BIOPSY Right 1990   EXCISIONAL - NEG  . CARDIAC CATHETERIZATION Bilateral 08/04/2015   Procedure: Right/Left Heart Cath and Coronary Angiography;  Surgeon: Wellington Hampshire, MD;  Location: [facility];  Service: Cardiovascular;  Laterality: Bilateral;  . CATARACT EXTRACTION, BILATERAL  2020  . ERCP N/A 10/31/2016   Procedure: ENDOSCOPIC RETROGRADE CHOLANGIOPANCREATOGRAPHY (ERCP);  Surgeon: Ladene Artist, MD;  Location[facility] ENDOSCOPY;  Service: Endoscopy;  Laterality: N/A;  . EYE SURGERY N/A    detatched retina- "not sure which eye"  . KNEE SURGERY  10/2005   calcinosis - in office procedure  . TEE WITHOUT CARDIOVERSION N/A 08/16/2015   Procedure: TRANSESOPHAGEAL ECHOCARDIOGRAM (TEE);  Surgeon: Ivin Poot, MD;  Location: [facility];  Service: Open Heart Surgery;  Laterality: N/A;     OB History   No obstetric history on  file.     Family History  Problem Relation Age of Onset  . Cancer Mother        lung  . Stroke Mother   . Cancer Father        pancreatic  . Pancreatic cancer Father   . Heart disease Sister   . Hypertension Brother   . Cancer Brother   . Diabetes Maternal Grandmother   . Hypertension Brother   . Obesity Sister   . Lung cancer Brother   . Breast cancer Neg Hx   . Colon cancer Neg Hx   . Esophageal cancer Neg Hx   . Rectal cancer Neg Hx   . Stomach cancer Neg Hx     Social History   Tobacco  Use  . Smoking status: Former Smoker    Years: 25.00    Quit date: 07/07/2002    Years since quitting: 17.5  . Smokeless tobacco: Never Used  . Tobacco comment: light smoker before quitting   Vaping Use  . Vaping Use: Never used  Substance Use Topics  . Alcohol use: No    Alcohol/week: 0.0 standard drinks  . Drug use: No    Home Medications Prior to Admission medications   Medication Sig Start Date End Date Taking? Authorizing Provider  acetaminophen (TYLENOL) 325 MG tablet Take 650 mg by mouth every 6 (six) hours as needed for mild pain, moderate pain, fever or headache.   Yes [provider]  aspirin EC 81 MG tablet Take 1 tablet (81 mg total) by mouth daily. 11/03/18  Yes Gollan, Kathlene November, MD  atorvastatin (LIPITOR) 10 MG tablet TAKE 1/2 TABLET BY MOUTH EVERY DAY. NEEDS OFFICE VISIT Patient taking differently: Take 5 mg by mouth daily. TAKE 1/2 TABLET BY MOUTH EVERY DAY. NEEDS OFFICE VISIT 01/16/20  Yes Tower, Wynelle Fanny, MD  Calcium Polycarbophil (FIBER LAXATIVE PO) Take 2 tablets by mouth 2 (two) times a day.   Yes [provider]  cholecalciferol (VITAMIN D) 1000 UNITS tablet Take 1,000 Units by mouth 2 (two) times daily.    Yes [provider]  esomeprazole (NEXIUM) 40 MG capsule TAKE 1 CAPSULE BY MOUTH EVERY DAY BEFORE BREAKFAST Patient taking differently: Take 40 mg by mouth daily before breakfast. TAKE 1 CAPSULE BY MOUTH EVERY DAY BEFORE BREAKFAST 12/02/18  Yes Tower, Wynelle Fanny, MD  fluticasone (FLONASE) 50 MCG/ACT nasal spray Place 1-2 sprays into both nostrils daily as needed for rhinitis.   Yes [provider]  hydrochlorothiazide (HYDRODIURIL) 25 MG tablet Take 1 tablet (25 mg total) by mouth 3 (three) times a week. 02/27/19 02/03/20 Yes Gollan, Kathlene November, MD  lisinopril (ZESTRIL) 20 MG tablet TAKE 1 TABLET BY MOUTH EVERY DAY Patient taking differently: Take 20 mg by mouth daily.  10/20/19  Yes Gollan, Kathlene November, MD  metoprolol tartrate  (LOPRESSOR) 25 MG tablet TAKE 1 TABLET BY MOUTH TWICE A DAY Patient taking differently: Take 25 mg by mouth 2 (two) times daily.  10/28/19  Yes Gollan, Kathlene November, MD  Multiple Vitamin (MULTIVITAMIN WITH MINERALS) TABS tablet Take 1 tablet by mouth daily.   Yes [provider]  omega-3 acid ethyl esters (LOVAZA) 1 g capsule Take 1 g by mouth 2 (two) times daily.   Yes [provider]  potassium chloride (KLOR-CON) 10 MEQ tablet Take 1 tablet (10 mEq total) by mouth 3 (three) times a week. Please call office to schedule appointment for February. 02/01/20 05/01/20 Yes Minna Merritts, MD  Allergies    Codeine  Review of Systems   Review of Systems  Cardiovascular: Positive for syncope. Negative for chest pain.  All other systems reviewed and are negative.   Physical Exam Updated Vital Signs BP 139/62   Pulse 72   Temp 97.9 F (36.6 C)   Resp 17   Ht 5\' 7"  (1.702 m)   Wt 74.8 kg   SpO2 97%   BMI 25.84 kg/m   Physical Exam Vitals and nursing note reviewed.  Constitutional:      Appearance: She is well-developed.  HENT:     Head: Normocephalic.     Mouth/Throat:     Mouth: Mucous membranes are moist.  Cardiovascular:     Rate and Rhythm: Normal rate and regular rhythm.  Pulmonary:     Effort: Pulmonary effort is normal.  Abdominal:     General: There is no distension.     Palpations: Abdomen is soft.  Musculoskeletal:        General: Normal range of motion.     Cervical back: Normal range of motion.  Skin:    General: Skin is warm.  Neurological:     General: No focal deficit present.     Mental Status: She is alert and oriented to person, place, and time.  Psychiatric:        Mood and Affect: Mood normal.     ED Results / Procedures / Treatments   Labs (all labs ordered are listed, but only abnormal results are displayed) Labs Reviewed  CBC WITH DIFFERENTIAL/PLATELET - Abnormal; Notable for the following components:      Result Value   WBC  1.8 (*)    RBC 3.12 (*)    Hemoglobin 9.5 (*)    HCT 30.3 (*)    Platelets 83 (*)    Neutro Abs 1.2 (*)    Lymphs Abs 0.3 (*)    All other components within normal limits  COMPREHENSIVE METABOLIC PANEL - Abnormal; Notable for the following components:   Sodium 134 (*)    Potassium 3.0 (*)    CO2 15 (*)    Calcium 6.6 (*)    Total Protein 4.1 (*)    Albumin 2.2 (*)    All other components within normal limits  RESPIRATORY PANEL BY RT PCR (FLU A&B, COVID)  CULTURE, BLOOD (ROUTINE X 2)  CULTURE, BLOOD (ROUTINE X 2)  LACTIC ACID, PLASMA  LACTATE DEHYDROGENASE  TRIGLYCERIDES  LACTIC ACID, PLASMA  PROCALCITONIN  C-REACTIVE PROTEIN  D-DIMER, QUANTITATIVE (NOT AT [facility])  FERRITIN  FIBRINOGEN    EKG None  Radiology DG Chest Port 1 View  Result Date: 02/03/2020 CLINICAL DATA:  COVID-19 positive EXAM: PORTABLE CHEST 1 VIEW COMPARISON:  November 10, 2019 FINDINGS: Lungs are clear. Heart size and pulmonary vascularity are normal. Patient is status post median sternotomy. No adenopathy. No bone lesions. IMPRESSION: Lungs clear.  Heart size normal.  No adenopathy. Electronically Signed   By: Lowella Grip III M.D.   On: 02/03/2020 15:06    Procedures Procedures (including critical care time)  Medications Ordered in ED Medications - No data to display  ED Course  I have reviewed the triage vital signs and the nursing notes.  Pertinent labs & imaging results that were available during my care of the patient were reviewed by me and considered in my medical decision making (see chart for details).    MDM Rules/Calculators/A&P  MDM:  Pt unable to control bowel movements.  Nurse cleaned and placed pt in a diaper.  Husband has covid as well  I will consult hospitalist for admission  Final Clinical Impression(s) / ED Diagnoses Final diagnoses:  Syncope, unspecified syncope type  COVID  Diarrhea, unspecified type    Rx / DC Orders ED Discharge Orders     None       Fransico Meadow, [place] 02/03/20 1800    Charlesetta Shanks, MD 02/07/20 1450

## 2020-02-04 ENCOUNTER — Other Ambulatory Visit (HOSPITAL_COMMUNITY): Payer: Medicare PPO

## 2020-02-04 DIAGNOSIS — R55 Syncope and collapse: Secondary | ICD-10-CM | POA: Diagnosis not present

## 2020-02-04 DIAGNOSIS — U071 COVID-19: Secondary | ICD-10-CM | POA: Diagnosis not present

## 2020-02-04 DIAGNOSIS — I10 Essential (primary) hypertension: Secondary | ICD-10-CM

## 2020-02-04 DIAGNOSIS — R197 Diarrhea, unspecified: Secondary | ICD-10-CM | POA: Diagnosis not present

## 2020-02-04 LAB — CBC WITH DIFFERENTIAL/PLATELET
Abs Immature Granulocytes: 0.03 10*3/uL (ref 0.00–0.07)
Basophils Absolute: 0 10*3/uL (ref 0.0–0.1)
Basophils Relative: 0 %
Eosinophils Absolute: 0 10*3/uL (ref 0.0–0.5)
Eosinophils Relative: 0 %
HCT: 34 % — ABNORMAL LOW (ref 36.0–46.0)
Hemoglobin: 11.4 g/dL — ABNORMAL LOW (ref 12.0–15.0)
Immature Granulocytes: 1 %
Lymphocytes Relative: 14 %
Lymphs Abs: 0.4 10*3/uL — ABNORMAL LOW (ref 0.7–4.0)
MCH: 30.5 pg (ref 26.0–34.0)
MCHC: 33.5 g/dL (ref 30.0–36.0)
MCV: 90.9 fL (ref 80.0–100.0)
Monocytes Absolute: 0.3 10*3/uL (ref 0.1–1.0)
Monocytes Relative: 10 %
Neutro Abs: 2 10*3/uL (ref 1.7–7.7)
Neutrophils Relative %: 75 %
Platelets: 147 10*3/uL — ABNORMAL LOW (ref 150–400)
RBC: 3.74 MIL/uL — ABNORMAL LOW (ref 3.87–5.11)
RDW: 13.2 % (ref 11.5–15.5)
WBC: 2.7 10*3/uL — ABNORMAL LOW (ref 4.0–10.5)
nRBC: 0 % (ref 0.0–0.2)

## 2020-02-04 LAB — COMPREHENSIVE METABOLIC PANEL
ALT: 29 U/L (ref 0–44)
AST: 43 U/L — ABNORMAL HIGH (ref 15–41)
Albumin: 3 g/dL — ABNORMAL LOW (ref 3.5–5.0)
Alkaline Phosphatase: 87 U/L (ref 38–126)
Anion gap: 8 (ref 5–15)
BUN: 11 mg/dL (ref 8–23)
CO2: 26 mmol/L (ref 22–32)
Calcium: 9.3 mg/dL (ref 8.9–10.3)
Chloride: 99 mmol/L (ref 98–111)
Creatinine, Ser: 0.92 mg/dL (ref 0.44–1.00)
GFR, Estimated: >60 mL/min (ref >60)
Glucose, Bld: 97 mg/dL (ref 70–99)
Potassium: 4.3 mmol/L (ref 3.5–5.1)
Sodium: 133 mmol/L — ABNORMAL LOW (ref 135–145)
Total Bilirubin: 0.5 mg/dL (ref 0.3–1.2)
Total Protein: 6 g/dL — ABNORMAL LOW (ref 6.5–8.1)

## 2020-02-04 LAB — MAGNESIUM: Magnesium: 1.7 mg/dL (ref 1.7–2.4)

## 2020-02-04 LAB — FERRITIN: Ferritin: 228 ng/mL (ref 11–307)

## 2020-02-04 LAB — D-DIMER, QUANTITATIVE: D-Dimer, Quant: 0.68 ug/mL-FEU — ABNORMAL HIGH (ref 0.00–0.50)

## 2020-02-04 LAB — PHOSPHORUS: Phosphorus: 2.6 mg/dL (ref 2.5–4.6)

## 2020-02-04 LAB — C-REACTIVE PROTEIN: CRP: 5.1 mg/dL — ABNORMAL HIGH (ref <1.0)

## 2020-02-04 MED ORDER — SODIUM CHLORIDE 0.9 % IV SOLN: INTRAVENOUS | Status: DC

## 2020-02-04 MED ORDER — GUAIFENESIN-DM 100-10 MG/5ML PO SYRP
5.0000 mL | ORAL_SOLUTION | ORAL | Status: DC | PRN
  Administered 2020-02-04 – 2020-02-06 (×3): 5 mL via ORAL
  Filled 2020-02-04 (×3): qty 5

## 2020-02-04 MED ORDER — MAGNESIUM SULFATE 2 GM/50ML IV SOLN
2.0000 g | Freq: Once | INTRAVENOUS | Status: AC
  Administered 2020-02-04: 2 g via INTRAVENOUS
  Filled 2020-02-04: qty 50

## 2020-02-04 MED ORDER — PROMETHAZINE HCL 25 MG/ML IJ SOLN
12.5000 mg | Freq: Four times a day (QID) | INTRAMUSCULAR | Status: DC | PRN
  Administered 2020-02-04: 12.5 mg via INTRAVENOUS
  Filled 2020-02-04: qty 1

## 2020-02-04 MED ORDER — PROMETHAZINE HCL 25 MG/ML IJ SOLN: 12.5000 mg | Freq: Four times a day (QID) | INTRAMUSCULAR | Status: DC | PRN

## 2020-02-04 NOTE — Progress Notes (Signed)
PT Cancellation Note  Patient Details Name: Heidi Middleton MRN: [id number] DOB: 1945-08-18   Cancelled Treatment:    Reason Eval/Treat Not Completed: Medical issues which prohibited therapy  Earlier pt being cleaned up due to persistent diarrhea. RN reports recently given phenergan and is sleeping at this time. He feels she needs to sleep/rest at this time. Agreed and will attempt eval 11/19.    Arby Barrette, PT Pager [phone]  Rexanne Mano 02/04/2020, 3:21 PM

## 2020-02-04 NOTE — Progress Notes (Signed)
PROGRESS NOTE                                                                                                                                                                                                             Patient Demographics:    Heidi Middleton, is a 74 y.o. female, DOB - 07/21/1945, YPP:[id number]  Outpatient Primary MD for the patient is Alroy Dust, L.Marlou Sa, MD   Admit date - 02/03/2020   LOS - 1  Chief Complaint  Patient presents with  . Covid Positive    n/v, diarrhea       Brief Narrative: Patient is a 74 y.o. 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  COVID-19 vaccinated status: Unvaccinated  Significant Events: 11/17>> Admit to [facility] for syncope-nausea/vomiting/diarrhea x5 days-COVID-19 positive recently.  Significant studies: 11/17>>Chest x-ray: No pneumonia  COVID-19 medications: 11/17>>MAB x1  Antibiotics: None  Microbiology data: 11/17 >>blood culture: Pending  Procedures: None  Consults: None  DVT prophylaxis: enoxaparin (LOVENOX) injection 40 mg Start: 02/03/20 1900    Subjective:    Heidi Middleton today continues to feel poorly-diarrhea improving-continues to feel very nauseous.  Acknowledges 1 prior syncopal episode in the setting of nausea/vomiting/diarrhea as well.   Assessment  & Plan :   Syncope: Suspicion for vasovagal mechanism-has had a prior syncope several years back in a similar situation of GI illness.  Telemetry negative.  Await echo/orthostatic vital signs.  Continue telemetry monitoring.  Nausea/vomiting/diarrhea: Continues to have some nausea-diarrhea slowly improving-likely secondary to COVID-19 infection.  Continue as needed antiemetics and  loperamide.  Non-anion gap metabolic acidosis: Secondary to diarrhea-resolved after IV fluids with bicarb and some improvement in diarrhea.  Follow electrolytes periodically.  Hypokalemia: Repleted-recheck electrolytes periodically.  COVID-19 infection: Mostly GI symptoms-some cough-O2 saturation hovering in the low 90s high 80s at times-lungs are clear-chest x-ray done on admission negative for pneumonia.  Unvaccinated-given multiple medical comorbidities-at risk of progression to severe disease.  Repeat chest x-ray tomorrow.  Received monoclonal antibody yesterday.  Fever: afebrile O2 requirements:  SpO2: 94 %   COVID-19 Labs: Recent Labs    02/03/20 1410 02/03/20 1500 02/04/20 0244  DDIMER  --  1.04* 0.68*  FERRITIN  --  184 228  LDH 176  --   --   CRP  --  3.5* 5.1*    No results found for: BNP  Recent Labs  Lab 02/03/20 1410  PROCALCITON <0.10    Lab Results  Component Value Date   SARSCOV2NAA POSITIVE (A) 02/03/2020    Pancytopenia: Secondary to COVID-19 infection-improving slowly.  Follow.  HLD: Continue statin  HTN: BP stable-continue lisinopril and metoprolol  History of bioprosthetic aortic valve replacement 2017  Deconditioning/debility: Mostly secondary to acute illness-await PT/OT eval.  ABG:    Component Value Date/Time   PHART 7.283 (L) 08/16/2015 2118   PCO2ART 47.7 (H) 08/16/2015 2118   PO2ART 91.0 08/16/2015 2118   HCO3 25.2 02/03/2020 2200   TCO2 27 02/03/2020 1803   ACIDBASEDEF 4.0 (H) 08/16/2015 2118   O2SAT 41.3 02/03/2020 2200    Vent Settings: N/A    Condition -Stable  Family Communication  :  Spouse Arnette Norris [phone]) updated over the phone 11/18  Code Status :  Full Code  Diet :  Diet Order            Diet Heart Room service appropriate? Yes; Fluid consistency: Thin  Diet effective now                  Disposition Plan  :   Status is: Inpatient  Remains inpatient appropriate because:Inpatient level of care  appropriate due to severity of illness   Dispo: The patient is from: Home              Anticipated d/c is to: Home              Anticipated d/c date is: 2 days              Patient currently is not medically stable to d/c.   Barriers to discharge: Ongoing GI symptoms-mostly nausea/diarrhea-borderline hypoxemia-at high risk to progress to COVID-19 pneumonia.  Repeating x-ray chest tomorrow morning-continuing with supportive care and IV fluid hydration for GI symptoms.  Also needs PT evaluation to determine appropriate disposition on discharge.  Antimicorbials  :    Anti-infectives (From admission, onward)   None      Inpatient Medications  Scheduled Meds: . aspirin EC  81 mg Oral Daily  . atorvastatin  5 mg Oral Daily  . cholecalciferol  1,000 Units Oral BID  . enoxaparin (LOVENOX) injection  40 mg Subcutaneous Q24H  . lisinopril  20 mg Oral Daily  . metoprolol tartrate  25 mg Oral BID  . multivitamin with minerals  1 tablet Oral Daily  . omega-3 acid ethyl esters  1 g Oral BID  . pantoprazole  40 mg Oral Daily  . polycarbophil  1,250 mg Oral Daily   Continuous Infusions: . sodium chloride    . sodium bicarbonate (isotonic) 150 mEq in D5W 1000 mL infusion 100 mL/hr at 02/04/20 1100   PRN Meds:.sodium chloride, acetaminophen, albuterol, diphenhydrAMINE, EPINEPHrine, fluticasone, hydrALAZINE, loperamide, methylPREDNISolone (SOLU-MEDROL) injection, ondansetron **OR** ondansetron (ZOFRAN) IV, promethazine   Time Spent in minutes  25  See all Orders from today for further details   Oren Binet M.D on 02/04/2020 at 11:35 AM  To page go to [email or web address] - use universal password  [facility] -  Office  [phone]    Objective:   Vitals:   02/04/20 0005 02/04/20 0405 02/04/20 0749 02/04/20 0800  BP:  (!) 143/74 129/60 123/60  Pulse:  90 100 92  Resp:  18 19  Temp: 97.8 F (36.6 C) 98.3 F (36.8 C) 99.6 F (37.6 C)   TempSrc: Oral Oral Oral   SpO2:   93% 93% 94%  Weight:      Height:        Wt Readings from Last 3 Encounters:  02/03/20 74.8 kg  05/04/19 76.5 kg  12/02/18 74.6 kg     Intake/Output Summary (Last 24 hours) at 02/04/2020 1135 Last data filed at 02/04/2020 1100 Gross per 24 hour  Intake 1281.34 ml  Output --  Net 1281.34 ml     Physical Exam Gen Exam:Alert awake-not in any distress HEENT:atraumatic, normocephalic Chest: B/L clear to auscultation anteriorly CVS:S1S2 regular Abdomen:soft non tender, non distended Extremities:trace edema Neurology: Non focal Skin: no rash   Data Review:    CBC Recent Labs  Lab 02/03/20 1410 02/03/20 1803 02/04/20 0244  WBC 1.8*  --  2.7*  HGB 9.5* 12.9 11.4*  HCT 30.3* 38.0 34.0*  PLT 83*  --  147*  MCV 97.1  --  90.9  MCH 30.4  --  30.5  MCHC 31.4  --  33.5  RDW 13.1  --  13.2  LYMPHSABS 0.3*  --  0.4*  MONOABS 0.2  --  0.3  EOSABS 0.0  --  0.0  BASOSABS 0.0  --  0.0    Chemistries  Recent Labs  Lab 02/03/20 1410 02/03/20 1803 02/03/20 2210 02/04/20 0244  NA 134* 133*  --  133*  K 3.0* 4.1  --  4.3  CL 110  --   --  99  CO2 15*  --   --  26  GLUCOSE 94  --   --  97  BUN 10  --   --  11  CREATININE 0.81  --   --  0.92  CALCIUM 6.6*  --   --  9.3  MG  --   --  1.7 1.7  AST 32  --   --  43*  ALT 21  --   --  29  ALKPHOS 69  --   --  87  BILITOT 0.5  --   --  0.5   ------------------------------------------------------------------------------------------------------------------ Recent Labs    02/03/20 1410  TRIG 50    Lab Results  Component Value Date   HGBA1C 5.5 11/26/2018   ------------------------------------------------------------------------------------------------------------------ No results for input(s): TSH, T4TOTAL, T3FREE, THYROIDAB in the last 72 hours.  Invalid input(s): FREET3 ------------------------------------------------------------------------------------------------------------------ Recent Labs     02/03/20 1500 02/04/20 0244  FERRITIN 184 228    Coagulation profile No results for input(s): INR, PROTIME in the last 168 hours.  Recent Labs    02/03/20 1500 02/04/20 0244  DDIMER 1.04* 0.68*    Cardiac Enzymes No results for input(s): CKMB, TROPONINI, MYOGLOBIN in the last 168 hours.  Invalid input(s): CK ------------------------------------------------------------------------------------------------------------------ No results found for: BNP  Micro Results Recent Results (from the past 240 hour(s))  Respiratory Panel by RT PCR (Flu A&B, Covid) - Nasopharyngeal Swab     Status: Abnormal   Collection Time: 02/03/20  3:23 PM   Specimen: Nasopharyngeal Swab  Result Value Ref Range Status   SARS Coronavirus 2 by RT PCR POSITIVE (A) NEGATIVE Final    Comment: RESULT CALLED TO, READ BACK BY AND VERIFIED WITH: C BLACK RN 02/03/20 AT 1738 SK (NOTE) SARS-CoV-2 target nucleic acids are DETECTED.  SARS-CoV-2 RNA is generally detectable in upper respiratory specimens  during the acute phase of infection. Positive results are indicative of the presence of the  identified virus, but do not rule out bacterial infection or co-infection with other pathogens not detected by the test. Clinical correlation with patient history and other diagnostic information is necessary to determine patient infection status. The expected result is Negative.  Fact Sheet for Patients:  PinkCheek.be  Fact Sheet for Healthcare Providers: GravelBags.it  This test is not yet approved or cleared by the [place] FDA and  has been authorized for detection and/or diagnosis of SARS-CoV-2 by FDA under an Emergency Use Authorization (EUA).  This EUA will remain in effect (meaning this test can be used ) for the duration of  the COVID-19 declaration under Section 564(b)(1) of the Act, 21 U.S.C. section 360bbb-3(b)(1), unless the authorization  is terminated or revoked sooner.      Influenza A by PCR NEGATIVE NEGATIVE Final   Influenza B by PCR NEGATIVE NEGATIVE Final    Comment: (NOTE) The Xpert Xpress SARS-CoV-2/FLU/RSV assay is intended as an aid in  the diagnosis of influenza from Nasopharyngeal swab specimens and  should not be used as a sole basis for treatment. Nasal washings and  aspirates are unacceptable for Xpert Xpress SARS-CoV-2/FLU/RSV  testing.  Fact Sheet for Patients: PinkCheek.be  Fact Sheet for Healthcare Providers: GravelBags.it  This test is not yet approved or cleared by the [place] FDA and  has been authorized for detection and/or diagnosis of SARS-CoV-2 by  FDA under an Emergency Use Authorization (EUA). This EUA will remain  in effect (meaning this test can be used) for the duration of the  Covid-19 declaration under Section 564(b)(1) of the Act, 21  U.S.C. section 360bbb-3(b)(1), unless the authorization is  terminated or revoked. Performed at [facility], [place] [address] [place], [place] [address]     Radiology Reports DG Chest Port 1 View  Result Date: 02/03/2020 CLINICAL DATA:  COVID-19 positive EXAM: PORTABLE CHEST 1 VIEW COMPARISON:  November 10, 2019 FINDINGS: Lungs are clear. Heart size and pulmonary vascularity are normal. Patient is status post median sternotomy. No adenopathy. No bone lesions. IMPRESSION: Lungs clear.  Heart size normal.  No adenopathy. Electronically Signed   By: Lowella Grip III M.D.   On: 02/03/2020 15:06

## 2020-02-05 ENCOUNTER — Inpatient Hospital Stay (HOSPITAL_COMMUNITY): Payer: Medicare PPO

## 2020-02-05 DIAGNOSIS — U071 COVID-19: Secondary | ICD-10-CM | POA: Diagnosis not present

## 2020-02-05 DIAGNOSIS — I10 Essential (primary) hypertension: Secondary | ICD-10-CM | POA: Diagnosis not present

## 2020-02-05 DIAGNOSIS — R55 Syncope and collapse: Secondary | ICD-10-CM

## 2020-02-05 DIAGNOSIS — R197 Diarrhea, unspecified: Secondary | ICD-10-CM | POA: Diagnosis not present

## 2020-02-05 LAB — CBC WITH DIFFERENTIAL/PLATELET
Abs Immature Granulocytes: 0.02 10*3/uL (ref 0.00–0.07)
Basophils Absolute: 0 10*3/uL (ref 0.0–0.1)
Basophils Relative: 1 %
Eosinophils Absolute: 0 10*3/uL (ref 0.0–0.5)
Eosinophils Relative: 0 %
HCT: 31.8 % — ABNORMAL LOW (ref 36.0–46.0)
Hemoglobin: 10.7 g/dL — ABNORMAL LOW (ref 12.0–15.0)
Immature Granulocytes: 1 %
Lymphocytes Relative: 26 %
Lymphs Abs: 0.7 10*3/uL (ref 0.7–4.0)
MCH: 30.1 pg (ref 26.0–34.0)
MCHC: 33.6 g/dL (ref 30.0–36.0)
MCV: 89.6 fL (ref 80.0–100.0)
Monocytes Absolute: 0.3 10*3/uL (ref 0.1–1.0)
Monocytes Relative: 12 %
Neutro Abs: 1.6 10*3/uL — ABNORMAL LOW (ref 1.7–7.7)
Neutrophils Relative %: 60 %
Platelets: 152 10*3/uL (ref 150–400)
RBC: 3.55 MIL/uL — ABNORMAL LOW (ref 3.87–5.11)
RDW: 12.8 % (ref 11.5–15.5)
WBC: 2.6 10*3/uL — ABNORMAL LOW (ref 4.0–10.5)
nRBC: 0 % (ref 0.0–0.2)

## 2020-02-05 LAB — COMPREHENSIVE METABOLIC PANEL
ALT: 30 U/L (ref 0–44)
AST: 46 U/L — ABNORMAL HIGH (ref 15–41)
Albumin: 2.7 g/dL — ABNORMAL LOW (ref 3.5–5.0)
Alkaline Phosphatase: 80 U/L (ref 38–126)
Anion gap: 8 (ref 5–15)
BUN: 14 mg/dL (ref 8–23)
CO2: 28 mmol/L (ref 22–32)
Calcium: 8.4 mg/dL — ABNORMAL LOW (ref 8.9–10.3)
Chloride: 94 mmol/L — ABNORMAL LOW (ref 98–111)
Creatinine, Ser: 0.92 mg/dL (ref 0.44–1.00)
GFR, Estimated: >60 mL/min (ref >60)
Glucose, Bld: 93 mg/dL (ref 70–99)
Potassium: 4 mmol/L (ref 3.5–5.1)
Sodium: 130 mmol/L — ABNORMAL LOW (ref 135–145)
Total Bilirubin: 0.4 mg/dL (ref 0.3–1.2)
Total Protein: 5.3 g/dL — ABNORMAL LOW (ref 6.5–8.1)

## 2020-02-05 LAB — PROCALCITONIN: Procalcitonin: <0.1 ng/mL

## 2020-02-05 LAB — MAGNESIUM: Magnesium: 1.9 mg/dL (ref 1.7–2.4)

## 2020-02-05 LAB — ECHOCARDIOGRAM LIMITED
AR max vel: 2.03 cm2
AV Area VTI: 2.15 cm2
AV Area mean vel: 1.98 cm2
AV Mean grad: 12 mmHg
AV Peak grad: 22.3 mmHg
Ao pk vel: 2.36 m/s
Area-P 1/2: 4.06 cm2
Height: 68 in
S' Lateral: 2.8 cm
Weight: 2638.4 oz

## 2020-02-05 LAB — D-DIMER, QUANTITATIVE: D-Dimer, Quant: 0.67 ug/mL-FEU — ABNORMAL HIGH (ref 0.00–0.50)

## 2020-02-05 LAB — FERRITIN: Ferritin: 282 ng/mL (ref 11–307)

## 2020-02-05 LAB — C-REACTIVE PROTEIN: CRP: 5.5 mg/dL — ABNORMAL HIGH (ref <1.0)

## 2020-02-05 NOTE — Progress Notes (Signed)
  Echocardiogram 2D Echocardiogram has been performed.  Heidi Middleton 02/05/2020, 12:05 PM

## 2020-02-05 NOTE — Evaluation (Signed)
Physical Therapy Evaluation Patient Details Name: Heidi Middleton MRN: [id number] DOB: [birth year] Today's Date: 02/05/2020   History of Present Illness  74 y.o. female with PMH significant of HTN, HLD, AVR, presented 02/03/20 with nausea, vomiting and diarrhea. reports she passed out x 5 minutes. Patient not vaccinated for COVID; tested +COVID  Clinical Impression   Pt presents with [facility] strength, impaired dynamic standing balance vs baseline, and decreased baseline activity tolerance. Pt to benefit from acute PT to address deficits. Pt ambulated hallway distance without AD and maintained SpO2 92% and greater, pt limited by dyspnea on exertion and tachypnea up to 40s breaths/min. PT educated pt on LE exercise to maintain strength and promote LE circulation, PT expects pt to progress well and does not anticipate follow up PT needs. PT to progress mobility as tolerated, and will continue to follow acutely.      Follow Up Recommendations No PT follow up;Supervision for mobility/OOB    Equipment Recommendations  None recommended by PT    Recommendations for Other Services       Precautions / Restrictions Precautions Precautions: None Restrictions Weight Bearing Restrictions: No      Mobility  Bed Mobility Overal bed mobility: Needs Assistance             General bed mobility comments: sitting EOB upon PT arrival to room    Transfers Overall transfer level: Needs assistance   Transfers: Sit to/from Stand Sit to Stand: Supervision         General transfer comment: for safety only, no physical assist  Ambulation/Gait Ambulation/Gait assistance: Supervision Gait Distance (Feet): 230 Feet Assistive device: None Gait Pattern/deviations: Step-through pattern;Decreased stride length Gait velocity: decr   General Gait Details: supervision for safety, closer guard when challenging dynamic balance. SpO2 92% and greater on RA, RRmax 46 breaths/min with DOE 2/4.  Stairs             Wheelchair Mobility    Modified Rankin (Stroke Patients Only)       Balance Overall balance assessment: Mild deficits observed, not formally tested                                           Pertinent Vitals/Pain Pain Assessment: No/denies pain    Home Living Family/patient expects to be discharged to:: Private residence Living Arrangements: Spouse/significant other Available Help at Discharge: Family;Available PRN/intermittently (husband all the time, other family members intermittently) Type of Home: House Home Access: Ramped entrance     Home Layout: One level Home Equipment: None      Prior Function Level of Independence: Independent         Comments: very active, does yoga and goes to gym. Pt enjoys gardening as well     Hand Dominance   Dominant Hand: Right    Extremity/Trunk Assessment   Upper Extremity Assessment Upper Extremity Assessment: Defer to OT evaluation    Lower Extremity Assessment Lower Extremity Assessment: Overall WFL for tasks assessed    Cervical / Trunk Assessment Cervical / Trunk Assessment: Normal  Communication   Communication: No difficulties  Cognition Arousal/Alertness: Awake/alert Behavior During Therapy: WFL for tasks assessed/performed Overall Cognitive Status: Within Functional Limits for tasks assessed  General Comments: pt demonstrating intermittent word-finding difficulties, states her brain feels "foggy" versus baseline      General Comments      Exercises General Exercises - Lower Extremity Long Arc Quad: AROM;Both;5 reps;Seated Hip Flexion/Marching: AROM;Both;5 reps;Seated   Assessment/Plan    PT Assessment Patient needs continued PT services  PT Problem List Decreased mobility;Decreased activity tolerance;Decreased balance;Cardiopulmonary status limiting activity       PT Treatment Interventions Therapeutic activities;Gait  training;Therapeutic exercise;Patient/family education;Balance training;Functional mobility training;Neuromuscular re-education    PT Goals (Current goals can be found in the Care Plan section)  Acute Rehab PT Goals Patient Stated Goal: go home to husband PT Goal Formulation: With patient Time For Goal Achievement: 02/19/20 Potential to Achieve Goals: Good    Frequency Min 3X/week   Barriers to discharge        Co-evaluation               AM-PAC PT "6 Clicks" Mobility  Outcome Measure Help needed turning from your back to your side while in a flat bed without using bedrails?: None Help needed moving from lying on your back to sitting on the side of a flat bed without using bedrails?: None Help needed moving to and from a bed to a chair (including a wheelchair)?: None Help needed standing up from a chair using your arms (e.g., wheelchair or bedside chair)?: None Help needed to walk in hospital room?: A Little Help needed climbing 3-5 steps with a railing? : A Little 6 Click Score: 22    End of Session   Activity Tolerance: Patient tolerated treatment well Patient left: in chair;with call bell/phone within reach Nurse Communication: Mobility status PT Visit Diagnosis: Other abnormalities of gait and mobility (R26.89)    Time: 7939-0300 (seen as dovetail with OT) PT Time Calculation (min) (ACUTE ONLY): 12 min   Charges:   PT Evaluation $PT Eval Low Complexity: 1 Low          Ameria Sanjurjo E, PT Acute Rehabilitation Services Pager [phone]  Office [phone]    Cristin Szatkowski D Elonda Husky 02/05/2020, 3:44 PM

## 2020-02-05 NOTE — Evaluation (Signed)
Occupational Therapy Evaluation Patient Details Name: Heidi Middleton MRN: [id number] DOB: 1945-08-25 Today's Date: 02/05/2020    History of Present Illness 74 y.o. female with PMH significant of HTN, HLD, AVR, presented 02/03/20 with nausea, vomiting and diarrhea. reports she passed out x 5 minutes. Patient not vaccinated for COVID; tested +COVID   Clinical Impression   PTA pt living with family and functioning at independent community level. Pt reports she is very active-exercises and tends to a large garden at baseline. At time of eval, pt able to complete transfers and mobility at supervision level. She demonstrated ADL at mod I level with increased time for rest breaks. She endorses generalized weakness limiting ability to complete ADL compared to baseline. Began education on energy conservation techniques and how to slowly and steadily increase activity back to baseline. SpO2 currently stable on RA, but respirations were up to mid 40s with little activity. Suspect no post acute OT necessary, but will continue to follow as acutely admitted to progress ADLs.     Follow Up Recommendations  No OT follow up    Equipment Recommendations  Tub/shower seat    Recommendations for Other Services       Precautions / Restrictions Precautions Precautions: None Restrictions Weight Bearing Restrictions: No      Mobility Bed Mobility Overal bed mobility: Needs Assistance             General bed mobility comments: sitting EOB on arrival    Transfers Overall transfer level: Needs assistance Equipment used: None Transfers: Sit to/from Stand Sit to Stand: Supervision         General transfer comment: for safety only, no physical assist    Balance Overall balance assessment: Mild deficits observed, not formally tested                                         ADL either performed or assessed with clinical judgement   ADL Overall ADL's : Modified independent                                        General ADL Comments: Pt demonstrates ability to complete ADL at mod I level (increased time, no device) but requires cues to implement energy conservation skills. Pt presents with poor activity tolerance from baseline. She was able to complete functional transfers and mobility without external assist, but required rest breaks     Vision Baseline Vision/History: Wears glasses Wears Glasses: At all times Patient Visual Report: No change from baseline       Perception     Praxis      Pertinent Vitals/Pain Pain Assessment: No/denies pain     Hand Dominance Right   Extremity/Trunk Assessment Upper Extremity Assessment Upper Extremity Assessment: Overall WFL for tasks assessed   Lower Extremity Assessment Lower Extremity Assessment: Overall WFL for tasks assessed   Cervical / Trunk Assessment Cervical / Trunk Assessment: Normal   Communication Communication Communication: No difficulties   Cognition Arousal/Alertness: Awake/alert Behavior During Therapy: WFL for tasks assessed/performed Overall Cognitive Status: Impaired/Different from baseline Area of Impairment: Memory;Problem solving                     Memory: Decreased short-term memory       Problem Solving: Slow processing;Requires verbal cues  General Comments: reports feeling more "confused and foggy" than baseline   General Comments       Exercises    Shoulder Instructions      Home Living Family/patient expects to be discharged to:: Private residence Living Arrangements: Spouse/significant other Available Help at Discharge: Family;Available PRN/intermittently (husband all the time, other family intermittently) Type of Home: House Home Access: Ramped entrance     Home Layout: One level     Bathroom Shower/Tub: Teacher, early years/pre: Handicapped height     Home Equipment: None          Prior Functioning/Environment  Level of Independence: Independent        Comments: very active, does yoga and goes to gym. Pt enjoys gardening as well        OT Problem List: Decreased knowledge of use of DME or AE;Decreased knowledge of precautions;Decreased activity tolerance;Cardiopulmonary status limiting activity      OT Treatment/Interventions: Self-care/ADL training;Therapeutic exercise;Patient/family education;Balance training;Energy conservation;Therapeutic activities;DME and/or AE instruction;Cognitive remediation/compensation    OT Goals(Current goals can be found in the care plan section) Acute Rehab OT Goals Patient Stated Goal: go home to husband OT Goal Formulation: With patient Time For Goal Achievement: 02/19/20 Potential to Achieve Goals: Good  OT Frequency: Min 2X/week   Barriers to D/C:            Co-evaluation              AM-PAC OT "6 Clicks" Daily Activity     Outcome Measure Help from another person eating meals?: None Help from another person taking care of personal grooming?: None Help from another person toileting, which includes using toliet, bedpan, or urinal?: None Help from another person bathing (including washing, rinsing, drying)?: A Little Help from another person to put on and taking off regular upper body clothing?: None Help from another person to put on and taking off regular lower body clothing?: None 6 Click Score: 23   End of Session Nurse Communication: Mobility status  Activity Tolerance: Patient tolerated treatment well Patient left: in chair;with call bell/phone within reach  OT Visit Diagnosis: Unsteadiness on feet (R26.81);Other abnormalities of gait and mobility (R26.89)                Time: 6546-5035 OT Time Calculation (min): 12 min Charges:  OT General Charges $OT Visit: 1 Visit OT Evaluation $OT Eval Low Complexity: 1 Low  Zenovia Jarred, MSOT, OTR/L Acute Rehabilitation Services [facility] Office Number: [phone] Pager:  [phone]  Zenovia Jarred 02/05/2020, 5:32 PM

## 2020-02-05 NOTE — Progress Notes (Signed)
PROGRESS NOTE                                                                                                                                                                                                             Patient Demographics:    Heidi Middleton, is a 74 y.o. female, DOB - [birth year], XBW:[id number]  Outpatient Primary MD for the patient is Alroy Dust, L.Marlou Sa, MD   Admit date - 02/03/2020   LOS - 2  Chief Complaint  Patient presents with  . Covid Positive    n/v, diarrhea       Brief Narrative: Patient is a 74 y.o. 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  COVID-19 vaccinated status: Unvaccinated  Significant Events: 11/17>> Admit to [facility] for syncope-nausea/vomiting/diarrhea x5 days-COVID-19 positive recently.  Significant studies: 11/17>>Chest x-ray: No pneumonia 11/19>> Echo: EF 65-70%-normal gradients across prosthetic valve. 11/19 chest x-ray: Peripheral consolidative changes in the right lateral chest-superimposed on background of pulmonary emphysema.  COVID-19 medications: 11/17>>MAB x1  Antibiotics: None  Microbiology data: 11/17 >>blood culture: No growth.  Procedures: None  Consults: None  DVT prophylaxis: enoxaparin (LOVENOX) injection 40 mg Start: 02/03/20 1900    Subjective:   Feels much better today-diarrhea has resolved-minimal nausea.  Has not yet ambulated this morning.  Some cough persists.   Assessment  & Plan :   Syncope: Suspicion for vasovagal mechanism-telemetry negative-orthostatic vital signs negative-echo with preserved EF-no major issues with the prosthetic valve on echo.  Doubt further work-up is required.    Nausea/vomiting/diarrhea:  Significantly better-only nausea persists-continue supportive care.   Non-anion gap metabolic acidosis: Secondary to diarrhea-resolved after IV fluids and improvement in diarrhea.  Hypokalemia: Repleted  COVID-19 infection: GI symptoms have improved-some cough but not hypoxic-chest x-ray this morning with possible pneumonia-check procalcitonin-repeat x-ray in the morning.  If starts developing significant hypoxemia-we will start steroids and Remdesivir.  Fever: afebrile O2 requirements:  SpO2: 95 %   COVID-19 Labs: Recent Labs    02/03/20 1410 02/03/20 1500 02/04/20 0244 02/05/20 0108  DDIMER  --  1.04* 0.68* 0.67*  FERRITIN  --  184 228 282  LDH 176  --   --   --  CRP  --  3.5* 5.1* 5.5*    No results found for: BNP  Recent Labs  Lab 02/03/20 1410  PROCALCITON <0.10    Lab Results  Component Value Date   SARSCOV2NAA POSITIVE (A) 02/03/2020    Pancytopenia: Secondary to COVID-19 infection-improving slowly.  Follow.  HLD: Continue statin  HTN: BP stable-continue lisinopril and metoprolol  History of bioprosthetic aortic valve replacement 2017  Deconditioning/debility: Mostly secondary to acute illness-await PT/OT eval.  ABG:    Component Value Date/Time   PHART 7.283 (L) 08/16/2015 2118   PCO2ART 47.7 (H) 08/16/2015 2118   PO2ART 91.0 08/16/2015 2118   HCO3 25.2 02/03/2020 2200   TCO2 27 02/03/2020 1803   ACIDBASEDEF 4.0 (H) 08/16/2015 2118   O2SAT 41.3 02/03/2020 2200    Vent Settings: N/A    Condition -Stable  Family Communication  :  Spouse Heidi Middleton [phone]) updated over the phone 11/19  Code Status :  Full Code  Diet :  Diet Order            Diet Heart Room service appropriate? Yes; Fluid consistency: Thin  Diet effective now                  Disposition Plan  :   Status is: Inpatient  Remains inpatient appropriate because:Inpatient level of care appropriate due to severity of illness   Dispo: The patient is from: Home               Anticipated d/c is to: Home              Anticipated d/c date is: 2 days              Patient currently is not medically stable to d/c.   Barriers to discharge: Improving COVID-19 symptoms-not yet stable for discharge-possible developing pneumonia-given risk factors for severe disease/unvaccinated status-reasonable to watch for another day.  Antimicorbials  :    Anti-infectives (From admission, onward)   None      Inpatient Medications  Scheduled Meds: . aspirin EC  81 mg Oral Daily  . atorvastatin  5 mg Oral Daily  . cholecalciferol  1,000 Units Oral BID  . enoxaparin (LOVENOX) injection  40 mg Subcutaneous Q24H  . lisinopril  20 mg Oral Daily  . metoprolol tartrate  25 mg Oral BID  . multivitamin with minerals  1 tablet Oral Daily  . omega-3 acid ethyl esters  1 g Oral BID  . pantoprazole  40 mg Oral Daily  . polycarbophil  1,250 mg Oral Daily   Continuous Infusions:  PRN Meds:.acetaminophen, fluticasone, guaiFENesin-dextromethorphan, hydrALAZINE, loperamide, ondansetron **OR** ondansetron (ZOFRAN) IV, promethazine   Time Spent in minutes  25  See all Orders from today for further details   Oren Binet M.D on 02/05/2020 at 3:06 PM  To page go to [email or web address] - use universal password  [facility] -  Office  [phone]    Objective:   Vitals:   02/04/20 1149 02/04/20 1600 02/04/20 2120 02/05/20 0620  BP: 129/60 (!) 148/62 (!) 164/78 (!) 133/48  Pulse: 84 83 88 80  Resp: 19 (!) 23 13 20   Temp: 98.5 F (36.9 C)  98.2 F (36.8 C) 98.2 F (36.8 C)  TempSrc: Axillary  Oral Oral  SpO2: 92% 92% 92% 95%  Weight:      Height:        Wt Readings from Last 3 Encounters:  02/03/20 74.8 kg  05/04/19 76.5 kg  12/02/18 74.6 kg  No intake or output data in the 24 hours ending 02/05/20 1506   Physical Exam Gen Exam:Alert awake-not in any distress HEENT:atraumatic, normocephalic Chest: B/L clear to auscultation anteriorly CVS:S1S2  regular Abdomen:soft non tender, non distended Extremities:no edema Neurology: Non focal Skin: no rash   Data Review:    CBC Recent Labs  Lab 02/03/20 1410 02/03/20 1803 02/04/20 0244 02/05/20 0108  WBC 1.8*  --  2.7* 2.6*  HGB 9.5* 12.9 11.4* 10.7*  HCT 30.3* 38.0 34.0* 31.8*  PLT 83*  --  147* 152  MCV 97.1  --  90.9 89.6  MCH 30.4  --  30.5 30.1  MCHC 31.4  --  33.5 33.6  RDW 13.1  --  13.2 12.8  LYMPHSABS 0.3*  --  0.4* 0.7  MONOABS 0.2  --  0.3 0.3  EOSABS 0.0  --  0.0 0.0  BASOSABS 0.0  --  0.0 0.0    Chemistries  Recent Labs  Lab 02/03/20 1410 02/03/20 1803 02/03/20 2210 02/04/20 0244 02/05/20 0108  NA 134* 133*  --  133* 130*  K 3.0* 4.1  --  4.3 4.0  CL 110  --   --  99 94*  CO2 15*  --   --  26 28  GLUCOSE 94  --   --  97 93  BUN 10  --   --  11 14  CREATININE 0.81  --   --  0.92 0.92  CALCIUM 6.6*  --   --  9.3 8.4*  MG  --   --  1.7 1.7 1.9  AST 32  --   --  43* 46*  ALT 21  --   --  29 30  ALKPHOS 69  --   --  87 80  BILITOT 0.5  --   --  0.5 0.4   ------------------------------------------------------------------------------------------------------------------ Recent Labs    02/03/20 1410  TRIG 50    Lab Results  Component Value Date   HGBA1C 5.5 11/26/2018   ------------------------------------------------------------------------------------------------------------------ No results for input(s): TSH, T4TOTAL, T3FREE, THYROIDAB in the last 72 hours.  Invalid input(s): FREET3 ------------------------------------------------------------------------------------------------------------------ Recent Labs    02/04/20 0244 02/05/20 0108  FERRITIN 228 282    Coagulation profile No results for input(s): INR, PROTIME in the last 168 hours.  Recent Labs    02/04/20 0244 02/05/20 0108  DDIMER 0.68* 0.67*    Cardiac Enzymes No results for input(s): CKMB, TROPONINI, MYOGLOBIN in the last 168 hours.  Invalid input(s):  CK ------------------------------------------------------------------------------------------------------------------ No results found for: BNP  Micro Results Recent Results (from the past 240 hour(s))  Blood Culture (routine x 2)     Status: None (Preliminary result)   Collection Time: 02/03/20  3:00 PM   Specimen: BLOOD RIGHT ARM  Result Value Ref Range Status   Specimen Description BLOOD RIGHT ARM  Final   Special Requests   Final    BOTTLES DRAWN AEROBIC AND ANAEROBIC Blood Culture adequate volume   Culture   Final    NO GROWTH 2 DAYS Performed at [facility], [address] [place], [place] [address]    Report Status PENDING  Incomplete  Respiratory Panel by RT PCR (Flu A&B, Covid) - Nasopharyngeal Swab     Status: Abnormal   Collection Time: 02/03/20  3:23 PM   Specimen: Nasopharyngeal Swab  Result Value Ref Range Status   SARS Coronavirus 2 by RT PCR POSITIVE (A) NEGATIVE Final    Comment: RESULT CALLED TO, READ BACK BY  AND VERIFIED WITH: C BLACK RN 02/03/20 AT 6203 SK (NOTE) SARS-CoV-2 target nucleic acids are DETECTED.  SARS-CoV-2 RNA is generally detectable in upper respiratory specimens  during the acute phase of infection. Positive results are indicative of the presence of the identified virus, but do not rule out bacterial infection or co-infection with other pathogens not detected by the test. Clinical correlation with patient history and other diagnostic information is necessary to determine patient infection status. The expected result is Negative.  Fact Sheet for Patients:  PinkCheek.be  Fact Sheet for Healthcare Providers: GravelBags.it  This test is not yet approved or cleared by the [place] FDA and  has been authorized for detection and/or diagnosis of SARS-CoV-2 by FDA under an Emergency Use Authorization (EUA).  This EUA will remain in effect (meaning this test can be used ) for  the duration of  the COVID-19 declaration under Section 564(b)(1) of the Act, 21 U.S.C. section 360bbb-3(b)(1), unless the authorization is terminated or revoked sooner.      Influenza A by PCR NEGATIVE NEGATIVE Final   Influenza B by PCR NEGATIVE NEGATIVE Final    Comment: (NOTE) The Xpert Xpress SARS-CoV-2/FLU/RSV assay is intended as an aid in  the diagnosis of influenza from Nasopharyngeal swab specimens and  should not be used as a sole basis for treatment. Nasal washings and  aspirates are unacceptable for Xpert Xpress SARS-CoV-2/FLU/RSV  testing.  Fact Sheet for Patients: PinkCheek.be  Fact Sheet for Healthcare Providers: GravelBags.it  This test is not yet approved or cleared by the [place] FDA and  has been authorized for detection and/or diagnosis of SARS-CoV-2 by  FDA under an Emergency Use Authorization (EUA). This EUA will remain  in effect (meaning this test can be used) for the duration of the  Covid-19 declaration under Section 564(b)(1) of the Act, 21  U.S.C. section 360bbb-3(b)(1), unless the authorization is  terminated or revoked. Performed at [facility], [place] [address], [place], [place] [address]   Blood Culture (routine x 2)     Status: None (Preliminary result)   Collection Time: 02/03/20  5:55 PM   Specimen: BLOOD RIGHT HAND  Result Value Ref Range Status   Specimen Description BLOOD RIGHT HAND  Final   Special Requests   Final    BOTTLES DRAWN AEROBIC AND ANAEROBIC Blood Culture results may not be optimal due to an inadequate volume of blood received in culture bottles   Culture   Final    NO GROWTH 2 DAYS Performed at [facility], [place] [address], [place], [place] [address]    Report Status PENDING  Incomplete    Radiology Reports DG Chest Port 1 View  Result Date: 02/05/2020 CLINICAL DATA:  Shortness of breath, history of COVID EXAM: PORTABLE CHEST 1 VIEW  COMPARISON:  02/03/2020 FINDINGS: Post median sternotomy. Tracheal air column in the midline. Cardiomediastinal contours and hilar structures are stable. Developing peripheral consolidative changes in the RIGHT lateral chest likely in lateral middle lobe or lower lobe. Lungs remain hyperinflated. No sign of effusion. On limited assessment skeletal structures without acute process. IMPRESSION: Peripheral consolidative changes in the RIGHT lateral chest, a finding that could be seen in the setting of COVID 19 infection as developed since the previous exam. Findings are likely superimposed on background pulmonary emphysema and are nonspecific given unilateral changes. Typical pneumonia could also present with this appearance. Electronically Signed   By: Zetta Bills M.D.   On: 02/05/2020 08:10  DG Chest Port 1 View  Result Date: 02/03/2020 CLINICAL DATA:  COVID-19 positive EXAM: PORTABLE CHEST 1 VIEW COMPARISON:  November 10, 2019 FINDINGS: Lungs are clear. Heart size and pulmonary vascularity are normal. Patient is status post median sternotomy. No adenopathy. No bone lesions. IMPRESSION: Lungs clear.  Heart size normal.  No adenopathy. Electronically Signed   By: Lowella Grip III M.D.   On: 02/03/2020 15:06   ECHOCARDIOGRAM LIMITED  Result Date: 02/05/2020    ECHOCARDIOGRAM REPORT   Patient Name:   Heidi Middleton Date of Exam: 02/05/2020 Medical Rec #:  [id number]      Height:       68.0 in Accession #:    [id number]     Weight:       164.9 lb Date of Birth:  1945-04-22      BSA:          1.883 m Patient Age:    46 years       BP:           133/48 mmHg Patient Gender: F              HR:           80 bpm. Exam Location:  Inpatient Procedure: Limited Echo, Cardiac Doppler and Color Doppler Indications:    Syncope  History:        Patient has prior history of Echocardiogram examinations, most                 recent 11/26/2018. Aortic Valve Disease; Risk Factors:Hypertension                 and Dyslipidemia.  S/P AVR. VALVE MAGNA EASE 21MM.                 Aortic Valve: 21 mm Edwards pericardial valve is present in the                 aortic position. Procedure Date: 08/17/2015.  Sonographer:    Clayton Lefort RDCS (AE) Referring Phys: [place]  1. Left ventricular ejection fraction, by estimation, is 65 to 70%. The left ventricle has normal function. The left ventricle has no regional wall motion abnormalities. There is mild concentric left ventricular hypertrophy. Left ventricular diastolic function could not be evaluated.  2. Right ventricular systolic function is normal. The right ventricular size is normal. There is normal pulmonary artery systolic pressure. The estimated right ventricular systolic pressure is 38.1 mmHg.  3. The mitral valve is grossly normal. No evidence of mitral valve regurgitation. No evidence of mitral stenosis.  4. 21 mm pericardial tissue aortic valve. Vmax 2.3 m/s, mean gradient 12 mmHG, EOA 2.15 cm2, DI 0.62. Trivial regurgitation. The aortic valve has been repaired/replaced. Aortic valve regurgitation is trivial. There is a 21 mm Edwards pericardial valve present in the aortic position. Procedure Date: 08/17/2015.  5. The inferior vena cava is normal in size with greater than 50% respiratory variability, suggesting right atrial pressure of 3 mmHg. Comparison(s): No significant change from prior study. Normal LV function. Normal gradients across prosthesis with trivial central regurgitation. FINDINGS  Left Ventricle: Left ventricular ejection fraction, by estimation, is 65 to 70%. The left ventricle has normal function. The left ventricle has no regional wall motion abnormalities. The left ventricular internal cavity size was normal in size. There is  mild concentric left ventricular hypertrophy. Abnormal (paradoxical) septal motion consistent with post-operative status. Left ventricular diastolic function could not  be evaluated. Right Ventricle: The right ventricular  size is normal. No increase in right ventricular wall thickness. Right ventricular systolic function is normal. There is normal pulmonary artery systolic pressure. The tricuspid regurgitant velocity is 2.52 m/s, and  with an assumed right atrial pressure of 3 mmHg, the estimated right ventricular systolic pressure is 19.1 mmHg. Left Atrium: Left atrial size was normal in size. Right Atrium: Right atrial size was normal in size. Pericardium: There is no evidence of pericardial effusion. Presence of pericardial fat pad. Mitral Valve: The mitral valve is grossly normal. No evidence of mitral valve regurgitation. No evidence of mitral valve stenosis. Tricuspid Valve: The tricuspid valve is grossly normal. Tricuspid valve regurgitation is trivial. No evidence of tricuspid stenosis. Aortic Valve: 21 mm pericardial tissue aortic valve. Vmax 2.3 m/s, mean gradient 12 mmHG, EOA 2.15 cm2, DI 0.62. Trivial regurgitation. The aortic valve has been repaired/replaced. Aortic valve regurgitation is trivial. Aortic valve mean gradient measures 12.0 mmHg. Aortic valve peak gradient measures 22.3 mmHg. Aortic valve area, by VTI measures 2.15 cm. There is a 21 mm Edwards pericardial valve present in the aortic position. Procedure Date: 08/17/2015. Pulmonic Valve: The pulmonic valve was grossly normal. Pulmonic valve regurgitation is not visualized. No evidence of pulmonic stenosis. Aorta: The aortic root is normal in size and structure. Venous: The inferior vena cava is normal in size with greater than 50% respiratory variability, suggesting right atrial pressure of 3 mmHg. IAS/Shunts: The interatrial septum appears to be lipomatous. The atrial septum is grossly normal.  LEFT VENTRICLE PLAX 2D LVIDd:         3.90 cm LVIDs:         2.80 cm LV PW:         1.40 cm LV IVS:        1.30 cm LVOT diam:     2.10 cm LV SV:         104 LV SV Index:   55 LVOT Area:     3.46 cm  IVC IVC diam: 1.40 cm LEFT ATRIUM         Index LA diam:    3.10 cm  1.65 cm/m  AORTIC VALVE AV Area (Vmax):    2.03 cm AV Area (Vmean):   1.98 cm AV Area (VTI):     2.15 cm AV Vmax:           236.00 cm/s AV Vmean:          162.000 cm/s AV VTI:            0.483 m AV Peak Grad:      22.3 mmHg AV Mean Grad:      12.0 mmHg LVOT Vmax:         138.00 cm/s LVOT Vmean:        92.800 cm/s LVOT VTI:          0.300 m LVOT/AV VTI ratio: 0.62  AORTA Ao Root diam: 2.80 cm MITRAL VALVE                TRICUSPID VALVE MV Area (PHT): 4.06 cm     TR Peak grad:   25.4 mmHg MV Decel Time: 187 msec     TR Vmax:        252.00 cm/s MV E velocity: 104.00 cm/s MV A velocity: 106.00 cm/s  SHUNTS MV E/A ratio:  0.98         Systemic VTI:  0.30 m  Systemic Diam: 2.10 cm Eleonore Chiquito MD Electronically signed by Eleonore Chiquito MD Signature Date/Time: 02/05/2020/1:02:24 PM    Final

## 2020-02-05 NOTE — Plan of Care (Signed)
Patient is resting in bed. R/o Cdiff. VSS. Remains on room air. OOB ambulating in room. Call bell within reach.   Problem: Education: Goal: Knowledge of General Education information will improve Description: Including pain rating scale, medication(s)/side effects and non-pharmacologic comfort measures Outcome: Progressing   Problem: Health Behavior/Discharge Planning: Goal: Ability to manage health-related needs will improve Outcome: Progressing   Problem: Clinical Measurements: Goal: Ability to maintain clinical measurements within normal limits will improve Outcome: Progressing Goal: Will remain free from infection Outcome: Progressing Goal: Diagnostic test results will improve Outcome: Progressing Goal: Respiratory complications will improve Outcome: Progressing Goal: Cardiovascular complication will be avoided Outcome: Progressing   Problem: Activity: Goal: Risk for activity intolerance will decrease Outcome: Progressing   Problem: Nutrition: Goal: Adequate nutrition will be maintained Outcome: Progressing   Problem: Coping: Goal: Level of anxiety will decrease Outcome: Progressing   Problem: Elimination: Goal: Will not experience complications related to bowel motility Outcome: Progressing Goal: Will not experience complications related to urinary retention Outcome: Progressing   Problem: Pain Managment: Goal: General experience of comfort will improve Outcome: Progressing   Problem: Safety: Goal: Ability to remain free from injury will improve Outcome: Progressing   Problem: Skin Integrity: Goal: Risk for impaired skin integrity will decrease Outcome: Progressing

## 2020-02-05 NOTE — TOC Initial Note (Signed)
Transition of Care [facility]) - Initial/Assessment Note    Patient Details  Name: Heidi Middleton MRN: [id number] Date of Birth: October 02, 1945  [facility] CM/SW Contact:    Verdell Carmine, RN Phone Number: 02/05/2020, 9:57 AM  Clinical Narrative:                 Patient from home with spouse, unvaccinated, having a few days of loose stools, tested positive for COVID on arrival, No lung involvement initially, now with potential lung process developing.  Still weak and nauseous,  May need home health  If pneumonia does develop will be in hospital longer and start on treatment modalities.  CM to follow for Home needs.  Expected Discharge Plan: [place] Barriers to Discharge: Continued Medical Work up   Patient Goals and CMS Choice        Expected Discharge Plan and Services Expected Discharge Plan: [place]   Discharge Planning Services: CM Consult   Living arrangements for the past 2 months: Single Family Home                                      Prior Living Arrangements/Services Living arrangements for the past 2 months: Single Family Home Lives with:: Spouse Patient language and need for interpreter reviewed:: Yes        Need for Family Participation in Patient Care: Yes (Comment) Care giver support system in place?: Yes (comment)   Criminal Activity/Legal Involvement Pertinent to Current Situation/Hospitalization: No - Comment as needed  Activities of Daily Living      Permission Sought/Granted                  Emotional Assessment       Orientation: : Oriented to Self, Oriented to Place, Oriented to  Time, Oriented to Situation Alcohol / Substance Use: Not Applicable Psych Involvement: No (comment)  Admission diagnosis:  Syncope, unspecified syncope type [R55] Diarrhea, unspecified type [R19.7] COVID [U07.1] COVID-19 [U07.1] Patient Active Problem List   Diagnosis Date Noted  . COVID  02/03/2020  . Syncope   . Diarrhea   . Toe dislocation, left, subsequent encounter 05/14/2018  . Elevated glucose level 06/19/2017  . Colitis 09/25/2016  . Estrogen deficiency 06/26/2016  . Aortic valve stenosis   . Routine general medical examination at a health care facility 06/24/2015  . Heart murmur, systolic 00/92/3300  . Need for hepatitis C screening test 06/20/2015  . Medicare annual wellness visit, subsequent 06/20/2015  . Encounter for Medicare annual wellness exam 06/13/2012  . Osteopenia 01/31/2010  . GERD 12/21/2009  . HYPERCHOLESTEROLEMIA 10/09/2006  . Quit smoking 10/09/2006  . GLAUCOMA 10/09/2006  . Essential hypertension 10/09/2006  . 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 OSTEOARTHRITIS 10/09/2006  . COLONIC POLYPS, HX OF 10/09/2006   PCP:  Alroy Dust, L.Marlou Sa, MD Pharmacy:   [id number] - [place], [place] [address] [place] [address] Phone: [phone] Fax: [phone]     Social Determinants of Health (SDOH) Interventions    Readmission Risk Interventions No flowsheet data found.

## 2020-02-06 ENCOUNTER — Inpatient Hospital Stay (HOSPITAL_COMMUNITY): Payer: Medicare PPO

## 2020-02-06 DIAGNOSIS — R55 Syncope and collapse: Secondary | ICD-10-CM | POA: Diagnosis not present

## 2020-02-06 DIAGNOSIS — U071 COVID-19: Secondary | ICD-10-CM | POA: Diagnosis not present

## 2020-02-06 DIAGNOSIS — R197 Diarrhea, unspecified: Secondary | ICD-10-CM | POA: Diagnosis not present

## 2020-02-06 DIAGNOSIS — I10 Essential (primary) hypertension: Secondary | ICD-10-CM | POA: Diagnosis not present

## 2020-02-06 LAB — CBC WITH DIFFERENTIAL/PLATELET
Abs Immature Granulocytes: 0.02 K/uL (ref 0.00–0.07)
Basophils Absolute: 0 K/uL (ref 0.0–0.1)
Basophils Relative: 0 %
Eosinophils Absolute: 0 K/uL (ref 0.0–0.5)
Eosinophils Relative: 0 %
HCT: 30 % — ABNORMAL LOW (ref 36.0–46.0)
Hemoglobin: 10.3 g/dL — ABNORMAL LOW (ref 12.0–15.0)
Immature Granulocytes: 1 %
Lymphocytes Relative: 23 %
Lymphs Abs: 0.7 K/uL (ref 0.7–4.0)
MCH: 30.6 pg (ref 26.0–34.0)
MCHC: 34.3 g/dL (ref 30.0–36.0)
MCV: 89 fL (ref 80.0–100.0)
Monocytes Absolute: 0.4 K/uL (ref 0.1–1.0)
Monocytes Relative: 12 %
Neutro Abs: 1.8 K/uL (ref 1.7–7.7)
Neutrophils Relative %: 64 %
Platelets: 152 K/uL (ref 150–400)
RBC: 3.37 MIL/uL — ABNORMAL LOW (ref 3.87–5.11)
RDW: 12.6 % (ref 11.5–15.5)
WBC: 2.8 K/uL — ABNORMAL LOW (ref 4.0–10.5)
nRBC: 0 % (ref 0.0–0.2)

## 2020-02-06 LAB — COMPREHENSIVE METABOLIC PANEL
ALT: 28 U/L (ref 0–44)
AST: 43 U/L — ABNORMAL HIGH (ref 15–41)
Albumin: 2.6 g/dL — ABNORMAL LOW (ref 3.5–5.0)
Alkaline Phosphatase: 74 U/L (ref 38–126)
Anion gap: 8 (ref 5–15)
BUN: 17 mg/dL (ref 8–23)
CO2: 27 mmol/L (ref 22–32)
Calcium: 8.7 mg/dL — ABNORMAL LOW (ref 8.9–10.3)
Chloride: 95 mmol/L — ABNORMAL LOW (ref 98–111)
Creatinine, Ser: 0.91 mg/dL (ref 0.44–1.00)
GFR, Estimated: >60 mL/min (ref >60)
Glucose, Bld: 95 mg/dL (ref 70–99)
Potassium: 3.9 mmol/L (ref 3.5–5.1)
Sodium: 130 mmol/L — ABNORMAL LOW (ref 135–145)
Total Bilirubin: 0.5 mg/dL (ref 0.3–1.2)
Total Protein: 5.2 g/dL — ABNORMAL LOW (ref 6.5–8.1)

## 2020-02-06 LAB — FERRITIN: Ferritin: 301 ng/mL (ref 11–307)

## 2020-02-06 LAB — C-REACTIVE PROTEIN: CRP: 6.5 mg/dL — ABNORMAL HIGH

## 2020-02-06 LAB — D-DIMER, QUANTITATIVE: D-Dimer, Quant: 0.86 ug{FEU}/mL — ABNORMAL HIGH (ref 0.00–0.50)

## 2020-02-06 LAB — MAGNESIUM: Magnesium: 1.7 mg/dL (ref 1.7–2.4)

## 2020-02-06 LAB — PROCALCITONIN: Procalcitonin: <0.1 ng/mL

## 2020-02-06 MED ORDER — SODIUM CHLORIDE 0.9 % IV SOLN
200.0000 mg | Freq: Once | INTRAVENOUS | Status: DC
  Filled 2020-02-06: qty 40

## 2020-02-06 MED ORDER — BENZONATATE 100 MG PO CAPS
200.0000 mg | ORAL_CAPSULE | Freq: Three times a day (TID) | ORAL | Status: DC
  Administered 2020-02-06 – 2020-02-08 (×7): 200 mg via ORAL
  Filled 2020-02-06 (×7): qty 2

## 2020-02-06 MED ORDER — SODIUM CHLORIDE 0.9 % IV SOLN
200.0000 mg | Freq: Once | INTRAVENOUS | Status: AC
  Administered 2020-02-06: 200 mg via INTRAVENOUS
  Filled 2020-02-06: qty 40

## 2020-02-06 MED ORDER — METHYLPREDNISOLONE SODIUM SUCC 40 MG IJ SOLR
40.0000 mg | Freq: Two times a day (BID) | INTRAMUSCULAR | Status: DC
  Administered 2020-02-06 – 2020-02-08 (×5): 40 mg via INTRAVENOUS
  Filled 2020-02-06 (×5): qty 1

## 2020-02-06 MED ORDER — ALBUTEROL SULFATE HFA 108 (90 BASE) MCG/ACT IN AERS
2.0000 | INHALATION_SPRAY | RESPIRATORY_TRACT | Status: DC | PRN
  Filled 2020-02-06: qty 6.7

## 2020-02-06 MED ORDER — MENTHOL 3 MG MT LOZG
1.0000 | LOZENGE | OROMUCOSAL | Status: DC | PRN
  Filled 2020-02-06: qty 9

## 2020-02-06 MED ORDER — PHENOL 1.4 % MT LIQD
1.0000 | OROMUCOSAL | Status: DC | PRN
  Administered 2020-02-06: 1 via OROMUCOSAL
  Filled 2020-02-06: qty 177

## 2020-02-06 MED ORDER — SODIUM CHLORIDE 0.9 % IV SOLN
100.0000 mg | Freq: Every day | INTRAVENOUS | Status: DC
  Administered 2020-02-07 – 2020-02-08 (×2): 100 mg via INTRAVENOUS
  Filled 2020-02-06 (×2): qty 20

## 2020-02-06 MED ORDER — SODIUM CHLORIDE 0.9 % IV SOLN: 100.0000 mg | Freq: Every day | INTRAVENOUS | Status: DC

## 2020-02-06 NOTE — Progress Notes (Signed)
PROGRESS NOTE                                                                                                                                                                                                             Patient Demographics:    Heidi Middleton, is a 74 y.o. female, DOB - Oct 31, 1945, PIR:[id number]  Outpatient Primary MD for the patient is Alroy Dust, L.Marlou Sa, MD   Admit date - 02/03/2020   LOS - 3  Chief Complaint  Patient presents with  . Covid Positive    n/v, diarrhea       Brief Narrative: Patient is a 74 y.o. 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  She was managed with supportive care-GI symptoms have resolved-however she is now starting to develop more cough and respiratory symptoms-see below.    COVID-19 vaccinated status: Unvaccinated  Significant Events: 11/17>> Admit to [facility] for syncope-nausea/vomiting/diarrhea x5 days-COVID-19 positive recently.  Significant studies: 11/17>>Chest x-ray: No pneumonia 11/19>> Echo: EF 65-70%-normal gradients across prosthetic valve. 11/19 chest x-ray: Peripheral consolidative changes in the right lateral chest-superimposed on background of pulmonary emphysema. 11/20>> chest x-ray: Unchanged right lower lung airspace disease.  COVID-19 medications: 11/17>>MAB x1 Steroids: 11/20>> Remdesivir: 11/20>>  Antibiotics: None  Microbiology data: 11/17 >>blood culture: No growth.  Procedures: None  Consults: None  DVT prophylaxis: enoxaparin (LOVENOX) injection 40 mg Start: 02/03/20 1900    Subjective:   Got short of breath with ambulation with physical therapy-does not feel good today-feels weak-GI symptoms have completely resolved.  O2  saturations on room air hovering from 87-91% while I was in the room.   Assessment  & Plan :   Syncope: Suspicion for vasovagal mechanism-telemetry negative-orthostatic vital signs negative-echo with preserved EF-no major issues with the prosthetic valve on echo.  Doubt further work-up is required.    Nausea/vomiting/diarrhea: Secondary to COVID-19 infection-has resolved.  Acute hypoxic respiratory failure due to COVID-19 pneumonia: Initially had hardly any respiratory symptoms when she first presented-however now as her GI symptoms have resolved-she is now starting to develop more respiratory symptoms.  Cough is much worse today.  She does acknowledge shortness of breath mostly with movement-at rest she appears  in no distress.  On room air at rest-O2 saturations were hovering around between 87-91% when I was in the room.  We will go ahead and start steroids and Remdesivir-and see how she does-concerned that with evolving chest x-ray and respiratory symptoms-that she may be at risk to progress into further parenchymal disease.  Non-anion gap metabolic acidosis: Secondary to diarrhea-resolved after IV fluids and improvement in diarrhea.  Hypokalemia: Repleted  Fever: afebrile O2 requirements:  SpO2: 90 %   COVID-19 Labs: Recent Labs    02/04/20 0244 02/05/20 0108 02/06/20 0134  DDIMER 0.68* 0.67* 0.86*  FERRITIN 228 282 301  CRP 5.1* 5.5* 6.5*    No results found for: BNP  Recent Labs  Lab 02/03/20 1410 02/05/20 0108 02/06/20 0134  PROCALCITON <0.10 <0.10 <0.10    Lab Results  Component Value Date   SARSCOV2NAA POSITIVE (A) 02/03/2020    Pancytopenia: Secondary to COVID-19 infection-improving slowly.  Follow.  HLD: Continue statin  HTN: BP stable-continue lisinopril and metoprolol  History of bioprosthetic aortic valve replacement 2017  Deconditioning/debility: Mostly secondary to acute illness-await PT/OT eval.  ABG:    Component Value Date/Time   PHART 7.283  (L) 08/16/2015 2118   PCO2ART 47.7 (H) 08/16/2015 2118   PO2ART 91.0 08/16/2015 2118   HCO3 25.2 02/03/2020 2200   TCO2 27 02/03/2020 1803   ACIDBASEDEF 4.0 (H) 08/16/2015 2118   O2SAT 41.3 02/03/2020 2200    Vent Settings: N/A    Condition -Stable  Family Communication  :  Spouse Arnette Norris [phone]) updated over the phone 11/20  Code Status :  Full Code  Diet :  Diet Order            Diet Heart Room service appropriate? Yes; Fluid consistency: Thin  Diet effective now                  Disposition Plan  :   Status is: Inpatient  Remains inpatient appropriate because:Inpatient level of care appropriate due to severity of illness   Dispo: The patient is from: Home              Anticipated d/c is to: Home              Anticipated d/c date is: 2 days              Patient currently is not medically stable to d/c.   Barriers to discharge: Developing pneumonialike symptoms-with worsening hypoxemia.  Starting steroid/Remdesivir.  Antimicorbials  :    Anti-infectives (From admission, onward)   Start     Dose/Rate Route Frequency Ordered Stop   02/07/20 1000  remdesivir 100 mg in sodium chloride 0.9 % 100 mL IVPB  Status:  Discontinued       "Followed by" Linked Group Details   100 mg 200 mL/hr over 30 Minutes Intravenous Daily 02/06/20 0915 02/06/20 0916   02/07/20 1000  remdesivir 100 mg in sodium chloride 0.9 % 100 mL IVPB       "Followed by" Linked Group Details   100 mg 200 mL/hr over 30 Minutes Intravenous Daily 02/06/20 0941 02/11/20 0959   02/06/20 1030  remdesivir 200 mg in sodium chloride 0.9% 250 mL IVPB       "Followed by" Linked Group Details   200 mg 580 mL/hr over 30 Minutes Intravenous Once 02/06/20 0941     02/06/20 1015  remdesivir 200 mg in sodium chloride 0.9% 250 mL IVPB  Status:  Discontinued       "  Followed by" Linked Group Details   200 mg 580 mL/hr over 30 Minutes Intravenous Once 02/06/20 0915 02/06/20 0916      Inpatient  Medications  Scheduled Meds: . aspirin EC  81 mg Oral Daily  . atorvastatin  5 mg Oral Daily  . benzonatate  200 mg Oral TID  . cholecalciferol  1,000 Units Oral BID  . enoxaparin (LOVENOX) injection  40 mg Subcutaneous Q24H  . lisinopril  20 mg Oral Daily  . methylPREDNISolone (SOLU-MEDROL) injection  40 mg Intravenous Q12H  . metoprolol tartrate  25 mg Oral BID  . multivitamin with minerals  1 tablet Oral Daily  . omega-3 acid ethyl esters  1 g Oral BID  . pantoprazole  40 mg Oral Daily  . polycarbophil  1,250 mg Oral Daily   Continuous Infusions: . remdesivir 200 mg in sodium chloride 0.9% 250 mL IVPB     Followed by  . [START ON 02/07/2020] remdesivir 100 mg in NS 100 mL     PRN Meds:.acetaminophen, albuterol, fluticasone, guaiFENesin-dextromethorphan, hydrALAZINE, loperamide, menthol-cetylpyridinium, ondansetron **OR** ondansetron (ZOFRAN) IV, phenol, promethazine   Time Spent in minutes  25  See all Orders from today for further details   Oren Binet M.D on 02/06/2020 at 2:11 PM  To page go to [email or web address] - use universal password  [facility] -  Office  [phone]    Objective:   Vitals:   02/05/20 0620 02/05/20 2125 02/05/20 2155 02/06/20 0529  BP: (!) 133/48 (!) 150/65 110/63 (!) 150/68  Pulse: 80 82 79 80  Resp: 20 (!) 24 20 16   Temp: 98.2 F (36.8 C) 98.6 F (37 C) 98.2 F (36.8 C) 98.1 F (36.7 C)  TempSrc: Oral Oral Oral Oral  SpO2: 95% 93% 92% 90%  Weight:      Height:        Wt Readings from Last 3 Encounters:  02/03/20 74.8 kg  05/04/19 76.5 kg  12/02/18 74.6 kg     Intake/Output Summary (Last 24 hours) at 02/06/2020 1411 Last data filed at 02/06/2020 0242 Gross per 24 hour  Intake --  Output 300 ml  Net -300 ml     Physical Exam Gen Exam:Alert awake-not in any distress HEENT:atraumatic, normocephalic Chest: B/L clear to auscultation anteriorly CVS:S1S2 regular Abdomen:soft non tender, non  distended Extremities:no edema Neurology: Non focal Skin: no rash   Data Review:    CBC Recent Labs  Lab 02/03/20 1410 02/03/20 1803 02/04/20 0244 02/05/20 0108 02/06/20 0134  WBC 1.8*  --  2.7* 2.6* 2.8*  HGB 9.5* 12.9 11.4* 10.7* 10.3*  HCT 30.3* 38.0 34.0* 31.8* 30.0*  PLT 83*  --  147* 152 152  MCV 97.1  --  90.9 89.6 89.0  MCH 30.4  --  30.5 30.1 30.6  MCHC 31.4  --  33.5 33.6 34.3  RDW 13.1  --  13.2 12.8 12.6  LYMPHSABS 0.3*  --  0.4* 0.7 0.7  MONOABS 0.2  --  0.3 0.3 0.4  EOSABS 0.0  --  0.0 0.0 0.0  BASOSABS 0.0  --  0.0 0.0 0.0    Chemistries  Recent Labs  Lab 02/03/20 1410 02/03/20 1803 02/03/20 2210 02/04/20 0244 02/05/20 0108 02/06/20 0134  NA 134* 133*  --  133* 130* 130*  K 3.0* 4.1  --  4.3 4.0 3.9  CL 110  --   --  99 94* 95*  CO2 15*  --   --  26 28 27   GLUCOSE 94  --   --  97 93 95  BUN 10  --   --  11 14 17   CREATININE 0.81  --   --  0.92 0.92 0.91  CALCIUM 6.6*  --   --  9.3 8.4* 8.7*  MG  --   --  1.7 1.7 1.9 1.7  AST 32  --   --  43* 46* 43*  ALT 21  --   --  29 30 28   ALKPHOS 69  --   --  87 80 74  BILITOT 0.5  --   --  0.5 0.4 0.5   ------------------------------------------------------------------------------------------------------------------ No results for input(s): CHOL, HDL, LDLCALC, TRIG, CHOLHDL, LDLDIRECT in the last 72 hours.  Lab Results  Component Value Date   HGBA1C 5.5 11/26/2018   ------------------------------------------------------------------------------------------------------------------ No results for input(s): TSH, T4TOTAL, T3FREE, THYROIDAB in the last 72 hours.  Invalid input(s): FREET3 ------------------------------------------------------------------------------------------------------------------ Recent Labs    02/05/20 0108 02/06/20 0134  FERRITIN 282 301    Coagulation profile No results for input(s): INR, PROTIME in the last 168 hours.  Recent Labs    02/05/20 0108 02/06/20 0134   DDIMER 0.67* 0.86*    Cardiac Enzymes No results for input(s): CKMB, TROPONINI, MYOGLOBIN in the last 168 hours.  Invalid input(s): CK ------------------------------------------------------------------------------------------------------------------ No results found for: BNP  Micro Results Recent Results (from the past 240 hour(s))  Blood Culture (routine x 2)     Status: None (Preliminary result)   Collection Time: 02/03/20  3:00 PM   Specimen: BLOOD RIGHT ARM  Result Value Ref Range Status   Specimen Description BLOOD RIGHT ARM  Final   Special Requests   Final    BOTTLES DRAWN AEROBIC AND ANAEROBIC Blood Culture adequate volume   Culture   Final    NO GROWTH 3 DAYS Performed at [facility], [address] [place], [place] [address]    Report Status PENDING  Incomplete  Respiratory Panel by RT PCR (Flu A&B, Covid) - Nasopharyngeal Swab     Status: Abnormal   Collection Time: 02/03/20  3:23 PM   Specimen: Nasopharyngeal Swab  Result Value Ref Range Status   SARS Coronavirus 2 by RT PCR POSITIVE (A) NEGATIVE Final    Comment: RESULT CALLED TO, READ BACK BY AND VERIFIED WITH: C BLACK RN 02/03/20 AT 1738 SK (NOTE) SARS-CoV-2 target nucleic acids are DETECTED.  SARS-CoV-2 RNA is generally detectable in upper respiratory specimens  during the acute phase of infection. Positive results are indicative of the presence of the identified virus, but do not rule out bacterial infection or co-infection with other pathogens not detected by the test. Clinical correlation with patient history and other diagnostic information is necessary to determine patient infection status. The expected result is Negative.  Fact Sheet for Patients:  PinkCheek.be  Fact Sheet for Healthcare Providers: GravelBags.it  This test is not yet approved or cleared by the [place] FDA and  has been authorized for detection and/or  diagnosis of SARS-CoV-2 by FDA under an Emergency Use Authorization (EUA).  This EUA will remain in effect (meaning this test can be used ) for the duration of  the COVID-19 declaration under Section 564(b)(1) of the Act, 21 U.S.C. section 360bbb-3(b)(1), unless the authorization is terminated or revoked sooner.      Influenza A by PCR NEGATIVE NEGATIVE Final   Influenza B by PCR NEGATIVE NEGATIVE Final    Comment: (NOTE) The Xpert Xpress SARS-CoV-2/FLU/RSV assay is intended as an aid in  the diagnosis of influenza from  Nasopharyngeal swab specimens and  should not be used as a sole basis for treatment. Nasal washings and  aspirates are unacceptable for Xpert Xpress SARS-CoV-2/FLU/RSV  testing.  Fact Sheet for Patients: PinkCheek.be  Fact Sheet for Healthcare Providers: GravelBags.it  This test is not yet approved or cleared by the [place] FDA and  has been authorized for detection and/or diagnosis of SARS-CoV-2 by  FDA under an Emergency Use Authorization (EUA). This EUA will remain  in effect (meaning this test can be used) for the duration of the  Covid-19 declaration under Section 564(b)(1) of the Act, 21  U.S.C. section 360bbb-3(b)(1), unless the authorization is  terminated or revoked. Performed at [facility], [place] [address] [place], [place] [address]   Blood Culture (routine x 2)     Status: None (Preliminary result)   Collection Time: 02/03/20  5:55 PM   Specimen: BLOOD RIGHT HAND  Result Value Ref Range Status   Specimen Description BLOOD RIGHT HAND  Final   Special Requests   Final    BOTTLES DRAWN AEROBIC AND ANAEROBIC Blood Culture results may not be optimal due to an inadequate volume of blood received in culture bottles   Culture   Final    NO GROWTH 3 DAYS Performed at [facility], [place] [address], [place], [place] [address]    Report Status PENDING  Incomplete    Radiology  Reports DG Chest Port 1 View  Result Date: 02/05/2020 CLINICAL DATA:  Shortness of breath, history of COVID EXAM: PORTABLE CHEST 1 VIEW COMPARISON:  02/03/2020 FINDINGS: Post median sternotomy. Tracheal air column in the midline. Cardiomediastinal contours and hilar structures are stable. Developing peripheral consolidative changes in the RIGHT lateral chest likely in lateral middle lobe or lower lobe. Lungs remain hyperinflated. No sign of effusion. On limited assessment skeletal structures without acute process. IMPRESSION: Peripheral consolidative changes in the RIGHT lateral chest, a finding that could be seen in the setting of COVID 19 infection as developed since the previous exam. Findings are likely superimposed on background pulmonary emphysema and are nonspecific given unilateral changes. Typical pneumonia could also present with this appearance. Electronically Signed   By: Zetta Bills M.D.   On: 02/05/2020 08:10   DG Chest Port 1 View  Result Date: 02/03/2020 CLINICAL DATA:  COVID-19 positive EXAM: PORTABLE CHEST 1 VIEW COMPARISON:  November 10, 2019 FINDINGS: Lungs are clear. Heart size and pulmonary vascularity are normal. Patient is status post median sternotomy. No adenopathy. No bone lesions. IMPRESSION: Lungs clear.  Heart size normal.  No adenopathy. Electronically Signed   By: Lowella Grip III M.D.   On: 02/03/2020 15:06   DG Chest Port 1V same Day  Result Date: 02/06/2020 CLINICAL DATA:  Follow-up pneumonia.  COVID-19 positive. EXAM: PORTABLE CHEST 1 VIEW COMPARISON:  02/05/2020 and prior radiographs FINDINGS: UPPER limits normal heart size and cardiac valve replacement changes again noted. RIGHT LOWER lung airspace disease is unchanged. No new pulmonary opacities are identified. No pleural effusion or pneumothorax noted. IMPRESSION: Unchanged RIGHT LOWER lung airspace disease. Electronically Signed   By: Margarette Canada M.D.   On: 02/06/2020 10:43   ECHOCARDIOGRAM  LIMITED  Result Date: 02/05/2020    ECHOCARDIOGRAM REPORT   Patient Name:   Heidi Middleton Date of Exam: 02/05/2020 Medical Rec #:  [id number]      Height:       68.0 in Accession #:    [id number]     Weight:  164.9 lb Date of Birth:  1946/01/29      BSA:          1.883 m Patient Age:    13 years       BP:           133/48 mmHg Patient Gender: F              HR:           80 bpm. Exam Location:  Inpatient Procedure: Limited Echo, Cardiac Doppler and Color Doppler Indications:    Syncope  History:        Patient has prior history of Echocardiogram examinations, most                 recent 11/26/2018. Aortic Valve Disease; Risk Factors:Hypertension                 and Dyslipidemia. S/P AVR. VALVE MAGNA EASE 21MM.                 Aortic Valve: 21 mm Edwards pericardial valve is present in the                 aortic position. Procedure Date: 08/17/2015.  Sonographer:    Clayton Lefort RDCS (AE) Referring Phys: [place]  1. Left ventricular ejection fraction, by estimation, is 65 to 70%. The left ventricle has normal function. The left ventricle has no regional wall motion abnormalities. There is mild concentric left ventricular hypertrophy. Left ventricular diastolic function could not be evaluated.  2. Right ventricular systolic function is normal. The right ventricular size is normal. There is normal pulmonary artery systolic pressure. The estimated right ventricular systolic pressure is 70.9 mmHg.  3. The mitral valve is grossly normal. No evidence of mitral valve regurgitation. No evidence of mitral stenosis.  4. 21 mm pericardial tissue aortic valve. Vmax 2.3 m/s, mean gradient 12 mmHG, EOA 2.15 cm2, DI 0.62. Trivial regurgitation. The aortic valve has been repaired/replaced. Aortic valve regurgitation is trivial. There is a 21 mm Edwards pericardial valve present in the aortic position. Procedure Date: 08/17/2015.  5. The inferior vena cava is normal in size with greater than 50%  respiratory variability, suggesting right atrial pressure of 3 mmHg. Comparison(s): No significant change from prior study. Normal LV function. Normal gradients across prosthesis with trivial central regurgitation. FINDINGS  Left Ventricle: Left ventricular ejection fraction, by estimation, is 65 to 70%. The left ventricle has normal function. The left ventricle has no regional wall motion abnormalities. The left ventricular internal cavity size was normal in size. There is  mild concentric left ventricular hypertrophy. Abnormal (paradoxical) septal motion consistent with post-operative status. Left ventricular diastolic function could not be evaluated. Right Ventricle: The right ventricular size is normal. No increase in right ventricular wall thickness. Right ventricular systolic function is normal. There is normal pulmonary artery systolic pressure. The tricuspid regurgitant velocity is 2.52 m/s, and  with an assumed right atrial pressure of 3 mmHg, the estimated right ventricular systolic pressure is 62.8 mmHg. Left Atrium: Left atrial size was normal in size. Right Atrium: Right atrial size was normal in size. Pericardium: There is no evidence of pericardial effusion. Presence of pericardial fat pad. Mitral Valve: The mitral valve is grossly normal. No evidence of mitral valve regurgitation. No evidence of mitral valve stenosis. Tricuspid Valve: The tricuspid valve is grossly normal. Tricuspid valve regurgitation is trivial. No evidence of tricuspid stenosis. Aortic Valve: 21 mm pericardial tissue aortic  valve. Vmax 2.3 m/s, mean gradient 12 mmHG, EOA 2.15 cm2, DI 0.62. Trivial regurgitation. The aortic valve has been repaired/replaced. Aortic valve regurgitation is trivial. Aortic valve mean gradient measures 12.0 mmHg. Aortic valve peak gradient measures 22.3 mmHg. Aortic valve area, by VTI measures 2.15 cm. There is a 21 mm Edwards pericardial valve present in the aortic position. Procedure Date: 08/17/2015.  Pulmonic Valve: The pulmonic valve was grossly normal. Pulmonic valve regurgitation is not visualized. No evidence of pulmonic stenosis. Aorta: The aortic root is normal in size and structure. Venous: The inferior vena cava is normal in size with greater than 50% respiratory variability, suggesting right atrial pressure of 3 mmHg. IAS/Shunts: The interatrial septum appears to be lipomatous. The atrial septum is grossly normal.  LEFT VENTRICLE PLAX 2D LVIDd:         3.90 cm LVIDs:         2.80 cm LV PW:         1.40 cm LV IVS:        1.30 cm LVOT diam:     2.10 cm LV SV:         104 LV SV Index:   55 LVOT Area:     3.46 cm  IVC IVC diam: 1.40 cm LEFT ATRIUM         Index LA diam:    3.10 cm 1.65 cm/m  AORTIC VALVE AV Area (Vmax):    2.03 cm AV Area (Vmean):   1.98 cm AV Area (VTI):     2.15 cm AV Vmax:           236.00 cm/s AV Vmean:          162.000 cm/s AV VTI:            0.483 m AV Peak Grad:      22.3 mmHg AV Mean Grad:      12.0 mmHg LVOT Vmax:         138.00 cm/s LVOT Vmean:        92.800 cm/s LVOT VTI:          0.300 m LVOT/AV VTI ratio: 0.62  AORTA Ao Root diam: 2.80 cm MITRAL VALVE                TRICUSPID VALVE MV Area (PHT): 4.06 cm     TR Peak grad:   25.4 mmHg MV Decel Time: 187 msec     TR Vmax:        252.00 cm/s MV E velocity: 104.00 cm/s MV A velocity: 106.00 cm/s  SHUNTS MV E/A ratio:  0.98         Systemic VTI:  0.30 m                             Systemic Diam: 2.10 cm Eleonore Chiquito MD Electronically signed by Eleonore Chiquito MD Signature Date/Time: 02/05/2020/1:02:24 PM    Final

## 2020-02-06 NOTE — Plan of Care (Signed)
Patient is currently resting in bed. VSS. Remains on room air. Frequent cough, given cough medicine. C/o sore throat, given throat spray. OOB ambulating in room. Call bell within reach.   Problem: Education: Goal: Knowledge of General Education information will improve Description: Including pain rating scale, medication(s)/side effects and non-pharmacologic comfort measures Outcome: Progressing   Problem: Health Behavior/Discharge Planning: Goal: Ability to manage health-related needs will improve Outcome: Progressing   Problem: Clinical Measurements: Goal: Ability to maintain clinical measurements within normal limits will improve Outcome: Progressing Goal: Will remain free from infection Outcome: Progressing Goal: Diagnostic test results will improve Outcome: Progressing Goal: Respiratory complications will improve Outcome: Progressing Goal: Cardiovascular complication will be avoided Outcome: Progressing   Problem: Activity: Goal: Risk for activity intolerance will decrease Outcome: Progressing   Problem: Nutrition: Goal: Adequate nutrition will be maintained Outcome: Progressing   Problem: Coping: Goal: Level of anxiety will decrease Outcome: Progressing   Problem: Elimination: Goal: Will not experience complications related to bowel motility Outcome: Progressing Goal: Will not experience complications related to urinary retention Outcome: Progressing   Problem: Pain Managment: Goal: General experience of comfort will improve Outcome: Progressing   Problem: Safety: Goal: Ability to remain free from injury will improve Outcome: Progressing   Problem: Skin Integrity: Goal: Risk for impaired skin integrity will decrease Outcome: Progressing

## 2020-02-07 ENCOUNTER — Encounter (HOSPITAL_COMMUNITY): Payer: Self-pay | Admitting: Internal Medicine

## 2020-02-07 ENCOUNTER — Other Ambulatory Visit: Payer: Self-pay

## 2020-02-07 DIAGNOSIS — R55 Syncope and collapse: Secondary | ICD-10-CM | POA: Diagnosis not present

## 2020-02-07 LAB — COMPREHENSIVE METABOLIC PANEL
ALT: 30 U/L (ref 0–44)
AST: 42 U/L — ABNORMAL HIGH (ref 15–41)
Albumin: 2.4 g/dL — ABNORMAL LOW (ref 3.5–5.0)
Alkaline Phosphatase: 84 U/L (ref 38–126)
Anion gap: 11 (ref 5–15)
BUN: 15 mg/dL (ref 8–23)
CO2: 25 mmol/L (ref 22–32)
Calcium: 8.8 mg/dL — ABNORMAL LOW (ref 8.9–10.3)
Chloride: 96 mmol/L — ABNORMAL LOW (ref 98–111)
Creatinine, Ser: 0.81 mg/dL (ref 0.44–1.00)
GFR, Estimated: >60 mL/min (ref >60)
Glucose, Bld: 152 mg/dL — ABNORMAL HIGH (ref 70–99)
Potassium: 3.9 mmol/L (ref 3.5–5.1)
Sodium: 132 mmol/L — ABNORMAL LOW (ref 135–145)
Total Bilirubin: 0.4 mg/dL (ref 0.3–1.2)
Total Protein: 5.5 g/dL — ABNORMAL LOW (ref 6.5–8.1)

## 2020-02-07 LAB — CBC WITH DIFFERENTIAL/PLATELET
Abs Immature Granulocytes: 0.03 10*3/uL (ref 0.00–0.07)
Basophils Absolute: 0 10*3/uL (ref 0.0–0.1)
Basophils Relative: 0 %
Eosinophils Absolute: 0 10*3/uL (ref 0.0–0.5)
Eosinophils Relative: 0 %
HCT: 29.8 % — ABNORMAL LOW (ref 36.0–46.0)
Hemoglobin: 10.2 g/dL — ABNORMAL LOW (ref 12.0–15.0)
Immature Granulocytes: 1 %
Lymphocytes Relative: 12 %
Lymphs Abs: 0.4 10*3/uL — ABNORMAL LOW (ref 0.7–4.0)
MCH: 30.4 pg (ref 26.0–34.0)
MCHC: 34.2 g/dL (ref 30.0–36.0)
MCV: 89 fL (ref 80.0–100.0)
Monocytes Absolute: 0.1 10*3/uL (ref 0.1–1.0)
Monocytes Relative: 5 %
Neutro Abs: 2.5 10*3/uL (ref 1.7–7.7)
Neutrophils Relative %: 82 %
Platelets: 191 10*3/uL (ref 150–400)
RBC: 3.35 MIL/uL — ABNORMAL LOW (ref 3.87–5.11)
RDW: 12.6 % (ref 11.5–15.5)
WBC: 3 10*3/uL — ABNORMAL LOW (ref 4.0–10.5)
nRBC: 0 % (ref 0.0–0.2)

## 2020-02-07 LAB — MAGNESIUM: Magnesium: 1.8 mg/dL (ref 1.7–2.4)

## 2020-02-07 LAB — FERRITIN: Ferritin: 326 ng/mL — ABNORMAL HIGH (ref 11–307)

## 2020-02-07 LAB — PROCALCITONIN: Procalcitonin: <0.1 ng/mL

## 2020-02-07 LAB — D-DIMER, QUANTITATIVE: D-Dimer, Quant: 0.95 ug/mL-FEU — ABNORMAL HIGH (ref 0.00–0.50)

## 2020-02-07 LAB — C-REACTIVE PROTEIN: CRP: 5.8 mg/dL — ABNORMAL HIGH (ref <1.0)

## 2020-02-07 MED ORDER — CARVEDILOL 6.25 MG PO TABS
6.2500 mg | ORAL_TABLET | Freq: Two times a day (BID) | ORAL | Status: DC
  Administered 2020-02-07 – 2020-02-08 (×2): 6.25 mg via ORAL
  Filled 2020-02-07 (×2): qty 1

## 2020-02-07 NOTE — Progress Notes (Signed)
SATURATION QUALIFICATIONS: (This note is used to comply with regulatory documentation for home oxygen)  Patient Saturations on Room Air at Rest = 95%  Patient Saturations on Room Air while Ambulating = 94%  Patient Saturations on 0 Liters of oxygen while Ambulating = 94%

## 2020-02-07 NOTE — Progress Notes (Signed)
PROGRESS NOTE                                                                                                                                                                                                             Patient Demographics:    Heidi Middleton, is a 74 y.o. female, DOB - 07-09-1945, UYQ:[id number]  Outpatient Primary MD for the patient is Alroy Dust, L.Marlou Sa, MD   Admit date - 02/03/2020   LOS - 4  Chief Complaint  Patient presents with  . Covid Positive    n/v, diarrhea       Brief Narrative: Patient is a 74 y.o. 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  She was managed with supportive care-GI symptoms have resolved-however she is now starting to develop more cough and respiratory symptoms-see below.    COVID-19 vaccinated status: Unvaccinated  Significant Events: 11/17>> Admit to [facility] for syncope-nausea/vomiting/diarrhea x5 days-COVID-19 positive recently.  Significant studies: 11/17>>Chest x-ray: No pneumonia 11/19>> Echo: EF 65-70%-normal gradients across prosthetic valve. 11/19 chest x-ray: Peripheral consolidative changes in the right lateral chest-superimposed on background of pulmonary emphysema. 11/20>> chest x-ray: Unchanged right lower lung airspace disease.  COVID-19 medications: 11/17>>MAB x1 Steroids: 11/20>> Remdesivir: 11/20>>  Antibiotics: None  Microbiology data: 11/17 >>blood culture: No growth.  Procedures: None  Consults: None  DVT prophylaxis: enoxaparin (LOVENOX) injection 40 mg Start: 02/03/20 1900    Subjective:   Patient in bed, appears comfortable, denies any headache, no fever, no chest pain or pressure, no shortness of breath , no abdominal  pain. No focal weakness.    Assessment  & Plan :   Syncope: Suspicion for vasovagal mechanism-telemetry negative-orthostatic vital signs negative-echo with preserved EF-no major issues with the prosthetic valve on echo.  Doubt further work-up is required.    Nausea/vomiting/diarrhea: Secondary to COVID-19 infection-has resolved.  Acute hypoxic respiratory failure due to COVID-19 pneumonia:  She is unfortunately not vaccinated initially had some COVID-19 gastroenteritis which has resolved, has mild to moderate parenchymal lung injury.  Currently on steroids and Remdesivir infusion with good response, continue to monitor.  If stable will try and complete outpatient infusions on 02/09/2020 and 02/10/2020.  Non-anion gap  metabolic acidosis: Secondary to diarrhea-resolved after IV fluids and improvement in diarrhea.  Bovine AVR.  Stable.    Hypokalemia: Repleted  Fever: afebrile O2 requirements:  SpO2: 94 %    Recent Labs  Lab 02/03/20 1410 02/03/20 1410 02/03/20 1500 02/03/20 1523 02/03/20 1803 02/03/20 2127 02/04/20 0244 02/05/20 0108 02/06/20 0134 02/07/20 0409  WBC 1.8*  --   --   --   --   --  2.7* 2.6* 2.8* 3.0*  HGB 9.5*   < >  --   --  12.9  --  11.4* 10.7* 10.3* 10.2*  HCT 30.3*   < >  --   --  38.0  --  34.0* 31.8* 30.0* 29.8*  PLT 83*  --   --   --   --   --  147* 152 152 191  CRP  --   --  3.5*  --   --   --  5.1* 5.5* 6.5* 5.8*  DDIMER  --   --  1.04*  --   --   --  0.68* 0.67* 0.86* 0.95*  PROCALCITON <0.10  --   --   --   --   --   --  <0.10 <0.10 <0.10  AST 32  --   --   --   --   --  43* 46* 43* 42*  ALT 21  --   --   --   --   --  29 30 28 30   ALKPHOS 69  --   --   --   --   --  87 80 74 84  BILITOT 0.5  --   --   --   --   --  0.5 0.4 0.5 0.4  ALBUMIN 2.2*  --   --   --   --   --  3.0* 2.7* 2.6* 2.4*  LATICACIDVEN 0.9  --   --   --   --  1.5  --   --   --   --   SARSCOV2NAA  --   --   --  POSITIVE*  --   --   --   --   --   --    < > = values in this  interval not displayed.      Pancytopenia: Secondary to COVID-19 infection-improving slowly.  Follow.  HLD: Continue statin  HTN: BP stable-continue lisinopril and metoprolol  History of bioprosthetic aortic valve replacement 2017  Deconditioning/debility: Mostly secondary to acute illness-await PT/OT eval.      Condition - Stable  Family Communication  :  Spouse Arnette Norris [phone]) updated over the phone 02/07/20  Code Status :  Full Code  Diet :  Diet Order            Diet Heart Room service appropriate? Yes; Fluid consistency: Thin  Diet effective now                  Disposition Plan  :   Status is: Inpatient  Remains inpatient appropriate because:Inpatient level of care appropriate due to severity of illness   Dispo: The patient is from: Home              Anticipated d/c is to: Home              Anticipated d/c date is: 2 days              Patient currently is not medically stable to d/c.  Barriers to discharge: Developing pneumonialike symptoms-with worsening hypoxemia.  Starting steroid/Remdesivir.  Antimicorbials  :    Anti-infectives (From admission, onward)   Start     Dose/Rate Route Frequency Ordered Stop   02/07/20 1000  remdesivir 100 mg in sodium chloride 0.9 % 100 mL IVPB  Status:  Discontinued       "Followed by" Linked Group Details   100 mg 200 mL/hr over 30 Minutes Intravenous Daily 02/06/20 0915 02/06/20 0916   02/07/20 1000  remdesivir 100 mg in sodium chloride 0.9 % 100 mL IVPB       "Followed by" Linked Group Details   100 mg 200 mL/hr over 30 Minutes Intravenous Daily 02/06/20 0941 02/11/20 0959   02/06/20 1030  remdesivir 200 mg in sodium chloride 0.9% 250 mL IVPB       "Followed by" Linked Group Details   200 mg 580 mL/hr over 30 Minutes Intravenous Once 02/06/20 0941 02/06/20 1510   02/06/20 1015  remdesivir 200 mg in sodium chloride 0.9% 250 mL IVPB  Status:  Discontinued       "Followed by" Linked Group Details   200  mg 580 mL/hr over 30 Minutes Intravenous Once 02/06/20 0915 02/06/20 0916      Inpatient Medications  Scheduled Meds: . aspirin EC  81 mg Oral Daily  . atorvastatin  5 mg Oral Daily  . benzonatate  200 mg Oral TID  . carvedilol  6.25 mg Oral BID WC  . cholecalciferol  1,000 Units Oral BID  . enoxaparin (LOVENOX) injection  40 mg Subcutaneous Q24H  . lisinopril  20 mg Oral Daily  . methylPREDNISolone (SOLU-MEDROL) injection  40 mg Intravenous Q12H  . multivitamin with minerals  1 tablet Oral Daily  . omega-3 acid ethyl esters  1 g Oral BID  . pantoprazole  40 mg Oral Daily  . polycarbophil  1,250 mg Oral Daily   Continuous Infusions: . remdesivir 100 mg in NS 100 mL 100 mg (02/07/20 0916)   PRN Meds:.acetaminophen, albuterol, fluticasone, guaiFENesin-dextromethorphan, hydrALAZINE, loperamide, menthol-cetylpyridinium, [DISCONTINUED] ondansetron **OR** ondansetron (ZOFRAN) IV, phenol, promethazine   Time Spent in minutes  25  See all Orders from today for further details   Lala Lund M.D on 02/07/2020 at 10:19 AM  To page go to [email or web address] - use universal password  [facility] -  Office  [phone]    Objective:   Vitals:   02/06/20 0529 02/06/20 1733 02/06/20 2046 02/07/20 0912  BP: (!) 150/68 124/66 (!) 158/71 (!) 147/104  Pulse: 80 80 94 90  Resp: 16 20 15    Temp: 98.1 F (36.7 C) 97.9 F (36.6 C) 97.6 F (36.4 C)   TempSrc: Oral Oral Oral   SpO2: 90% 92% 94%   Weight:      Height:        Wt Readings from Last 3 Encounters:  02/03/20 74.8 kg  05/04/19 76.5 kg  12/02/18 74.6 kg     Intake/Output Summary (Last 24 hours) at 02/07/2020 1019 Last data filed at 02/06/2020 1300 Gross per 24 hour  Intake 120 ml  Output --  Net 120 ml     Physical Exam  Awake Alert, No new F.N deficits, Normal affect [place].AT,PERRAL Supple Neck,No JVD, No cervical lymphadenopathy appriciated.  Symmetrical Chest wall movement, Good air movement  bilaterally, CTAB RRR,No Gallops, Rubs or new Murmurs, No Parasternal Heave +ve B.Sounds, Abd Soft, No tenderness, No organomegaly appriciated, No rebound - guarding or rigidity. No Cyanosis, Clubbing or edema, No new  Rash or bruise    Data Review:    CBC Recent Labs  Lab 02/03/20 1410 02/03/20 1410 02/03/20 1803 02/04/20 0244 02/05/20 0108 02/06/20 0134 02/07/20 0409  WBC 1.8*  --   --  2.7* 2.6* 2.8* 3.0*  HGB 9.5*   < > 12.9 11.4* 10.7* 10.3* 10.2*  HCT 30.3*   < > 38.0 34.0* 31.8* 30.0* 29.8*  PLT 83*  --   --  147* 152 152 191  MCV 97.1  --   --  90.9 89.6 89.0 89.0  MCH 30.4  --   --  30.5 30.1 30.6 30.4  MCHC 31.4  --   --  33.5 33.6 34.3 34.2  RDW 13.1  --   --  13.2 12.8 12.6 12.6  LYMPHSABS 0.3*  --   --  0.4* 0.7 0.7 0.4*  MONOABS 0.2  --   --  0.3 0.3 0.4 0.1  EOSABS 0.0  --   --  0.0 0.0 0.0 0.0  BASOSABS 0.0  --   --  0.0 0.0 0.0 0.0   < > = values in this interval not displayed.    Chemistries  Recent Labs  Lab 02/03/20 1410 02/03/20 1410 02/03/20 1803 02/03/20 2210 02/04/20 0244 02/05/20 0108 02/06/20 0134 02/07/20 0409  NA 134*   < > 133*  --  133* 130* 130* 132*  K 3.0*   < > 4.1  --  4.3 4.0 3.9 3.9  CL 110  --   --   --  99 94* 95* 96*  CO2 15*  --   --   --  26 28 27 25   GLUCOSE 94  --   --   --  97 93 95 152*  BUN 10  --   --   --  11 14 17 15   CREATININE 0.81  --   --   --  0.92 0.92 0.91 0.81  CALCIUM 6.6*  --   --   --  9.3 8.4* 8.7* 8.8*  MG  --   --   --  1.7 1.7 1.9 1.7 1.8  AST 32  --   --   --  43* 46* 43* 42*  ALT 21  --   --   --  29 30 28 30   ALKPHOS 69  --   --   --  87 80 74 84  BILITOT 0.5  --   --   --  0.5 0.4 0.5 0.4   < > = values in this interval not displayed.   ------------------------------------------------------------------------------------------------------------------ No results for input(s): CHOL, HDL, LDLCALC, TRIG, CHOLHDL, LDLDIRECT in the last 72 hours.  Lab Results  Component Value Date   HGBA1C  5.5 11/26/2018   ------------------------------------------------------------------------------------------------------------------ No results for input(s): TSH, T4TOTAL, T3FREE, THYROIDAB in the last 72 hours.  Invalid input(s): FREET3 ------------------------------------------------------------------------------------------------------------------ Recent Labs    02/06/20 0134 02/07/20 0409  FERRITIN 301 326*    Coagulation profile No results for input(s): INR, PROTIME in the last 168 hours.  Recent Labs    02/06/20 0134 02/07/20 0409  DDIMER 0.86* 0.95*    Cardiac Enzymes No results for input(s): CKMB, TROPONINI, MYOGLOBIN in the last 168 hours.  Invalid input(s): CK ------------------------------------------------------------------------------------------------------------------ No results found for: BNP  Micro Results Recent Results (from the past 240 hour(s))  Blood Culture (routine x 2)     Status: None (Preliminary result)   Collection Time: 02/03/20  3:00 PM   Specimen: BLOOD RIGHT ARM  Result Value  Ref Range Status   Specimen Description BLOOD RIGHT ARM  Final   Special Requests   Final    BOTTLES DRAWN AEROBIC AND ANAEROBIC Blood Culture adequate volume   Culture   Final    NO GROWTH 3 DAYS Performed at [facility], [address] [place], [place] [address]    Report Status PENDING  Incomplete  Respiratory Panel by RT PCR (Flu A&B, Covid) - Nasopharyngeal Swab     Status: Abnormal   Collection Time: 02/03/20  3:23 PM   Specimen: Nasopharyngeal Swab  Result Value Ref Range Status   SARS Coronavirus 2 by RT PCR POSITIVE (A) NEGATIVE Final    Comment: RESULT CALLED TO, READ BACK BY AND VERIFIED WITH: C BLACK RN 02/03/20 AT 1738 SK (NOTE) SARS-CoV-2 target nucleic acids are DETECTED.  SARS-CoV-2 RNA is generally detectable in upper respiratory specimens  during the acute phase of infection. Positive results are indicative of the presence of the  identified virus, but do not rule out bacterial infection or co-infection with other pathogens not detected by the test. Clinical correlation with patient history and other diagnostic information is necessary to determine patient infection status. The expected result is Negative.  Fact Sheet for Patients:  PinkCheek.be  Fact Sheet for Healthcare Providers: GravelBags.it  This test is not yet approved or cleared by the [place] FDA and  has been authorized for detection and/or diagnosis of SARS-CoV-2 by FDA under an Emergency Use Authorization (EUA).  This EUA will remain in effect (meaning this test can be used ) for the duration of  the COVID-19 declaration under Section 564(b)(1) of the Act, 21 U.S.C. section 360bbb-3(b)(1), unless the authorization is terminated or revoked sooner.      Influenza A by PCR NEGATIVE NEGATIVE Final   Influenza B by PCR NEGATIVE NEGATIVE Final    Comment: (NOTE) The Xpert Xpress SARS-CoV-2/FLU/RSV assay is intended as an aid in  the diagnosis of influenza from Nasopharyngeal swab specimens and  should not be used as a sole basis for treatment. Nasal washings and  aspirates are unacceptable for Xpert Xpress SARS-CoV-2/FLU/RSV  testing.  Fact Sheet for Patients: PinkCheek.be  Fact Sheet for Healthcare Providers: GravelBags.it  This test is not yet approved or cleared by the [place] FDA and  has been authorized for detection and/or diagnosis of SARS-CoV-2 by  FDA under an Emergency Use Authorization (EUA). This EUA will remain  in effect (meaning this test can be used) for the duration of the  Covid-19 declaration under Section 564(b)(1) of the Act, 21  U.S.C. section 360bbb-3(b)(1), unless the authorization is  terminated or revoked. Performed at [facility], [place] [address], [place], [place] [address]   Blood  Culture (routine x 2)     Status: None (Preliminary result)   Collection Time: 02/03/20  5:55 PM   Specimen: BLOOD RIGHT HAND  Result Value Ref Range Status   Specimen Description BLOOD RIGHT HAND  Final   Special Requests   Final    BOTTLES DRAWN AEROBIC AND ANAEROBIC Blood Culture results may not be optimal due to an inadequate volume of blood received in culture bottles   Culture   Final    NO GROWTH 3 DAYS Performed at [facility], [place] [address], [place], [place] [address]    Report Status PENDING  Incomplete    Radiology Reports DG Chest Port 1 View  Result Date: 02/05/2020 CLINICAL DATA:  Shortness of breath, history of  COVID EXAM: PORTABLE CHEST 1 VIEW COMPARISON:  02/03/2020 FINDINGS: Post median sternotomy. Tracheal air column in the midline. Cardiomediastinal contours and hilar structures are stable. Developing peripheral consolidative changes in the RIGHT lateral chest likely in lateral middle lobe or lower lobe. Lungs remain hyperinflated. No sign of effusion. On limited assessment skeletal structures without acute process. IMPRESSION: Peripheral consolidative changes in the RIGHT lateral chest, a finding that could be seen in the setting of COVID 19 infection as developed since the previous exam. Findings are likely superimposed on background pulmonary emphysema and are nonspecific given unilateral changes. Typical pneumonia could also present with this appearance. Electronically Signed   By: Zetta Bills M.D.   On: 02/05/2020 08:10   DG Chest Port 1 View  Result Date: 02/03/2020 CLINICAL DATA:  COVID-19 positive EXAM: PORTABLE CHEST 1 VIEW COMPARISON:  November 10, 2019 FINDINGS: Lungs are clear. Heart size and pulmonary vascularity are normal. Patient is status post median sternotomy. No adenopathy. No bone lesions. IMPRESSION: Lungs clear.  Heart size normal.  No adenopathy. Electronically Signed   By: Lowella Grip III M.D.   On: 02/03/2020 15:06   DG Chest Port  1V same Day  Result Date: 02/06/2020 CLINICAL DATA:  Follow-up pneumonia.  COVID-19 positive. EXAM: PORTABLE CHEST 1 VIEW COMPARISON:  02/05/2020 and prior radiographs FINDINGS: UPPER limits normal heart size and cardiac valve replacement changes again noted. RIGHT LOWER lung airspace disease is unchanged. No new pulmonary opacities are identified. No pleural effusion or pneumothorax noted. IMPRESSION: Unchanged RIGHT LOWER lung airspace disease. Electronically Signed   By: Margarette Canada M.D.   On: 02/06/2020 10:43   ECHOCARDIOGRAM LIMITED  Result Date: 02/05/2020    ECHOCARDIOGRAM REPORT   Patient Name:   RIYAN GAVINA Date of Exam: 02/05/2020 Medical Rec #:  [id number]      Height:       68.0 in Accession #:    [id number]     Weight:       164.9 lb Date of Birth:  1945-09-13      BSA:          1.883 m Patient Age:    75 years       BP:           133/48 mmHg Patient Gender: F              HR:           80 bpm. Exam Location:  Inpatient Procedure: Limited Echo, Cardiac Doppler and Color Doppler Indications:    Syncope  History:        Patient has prior history of Echocardiogram examinations, most                 recent 11/26/2018. Aortic Valve Disease; Risk Factors:Hypertension                 and Dyslipidemia. S/P AVR. VALVE MAGNA EASE 21MM.                 Aortic Valve: 21 mm Edwards pericardial valve is present in the                 aortic position. Procedure Date: 08/17/2015.  Sonographer:    Clayton Lefort RDCS (AE) Referring Phys: [place]  1. Left ventricular ejection fraction, by estimation, is 65 to 70%. The left ventricle has normal function. The left ventricle has no regional wall motion abnormalities. There is mild concentric left ventricular hypertrophy.  Left ventricular diastolic function could not be evaluated.  2. Right ventricular systolic function is normal. The right ventricular size is normal. There is normal pulmonary artery systolic pressure. The estimated right  ventricular systolic pressure is 38.7 mmHg.  3. The mitral valve is grossly normal. No evidence of mitral valve regurgitation. No evidence of mitral stenosis.  4. 21 mm pericardial tissue aortic valve. Vmax 2.3 m/s, mean gradient 12 mmHG, EOA 2.15 cm2, DI 0.62. Trivial regurgitation. The aortic valve has been repaired/replaced. Aortic valve regurgitation is trivial. There is a 21 mm Edwards pericardial valve present in the aortic position. Procedure Date: 08/17/2015.  5. The inferior vena cava is normal in size with greater than 50% respiratory variability, suggesting right atrial pressure of 3 mmHg. Comparison(s): No significant change from prior study. Normal LV function. Normal gradients across prosthesis with trivial central regurgitation. FINDINGS  Left Ventricle: Left ventricular ejection fraction, by estimation, is 65 to 70%. The left ventricle has normal function. The left ventricle has no regional wall motion abnormalities. The left ventricular internal cavity size was normal in size. There is  mild concentric left ventricular hypertrophy. Abnormal (paradoxical) septal motion consistent with post-operative status. Left ventricular diastolic function could not be evaluated. Right Ventricle: The right ventricular size is normal. No increase in right ventricular wall thickness. Right ventricular systolic function is normal. There is normal pulmonary artery systolic pressure. The tricuspid regurgitant velocity is 2.52 m/s, and  with an assumed right atrial pressure of 3 mmHg, the estimated right ventricular systolic pressure is 56.4 mmHg. Left Atrium: Left atrial size was normal in size. Right Atrium: Right atrial size was normal in size. Pericardium: There is no evidence of pericardial effusion. Presence of pericardial fat pad. Mitral Valve: The mitral valve is grossly normal. No evidence of mitral valve regurgitation. No evidence of mitral valve stenosis. Tricuspid Valve: The tricuspid valve is grossly normal.  Tricuspid valve regurgitation is trivial. No evidence of tricuspid stenosis. Aortic Valve: 21 mm pericardial tissue aortic valve. Vmax 2.3 m/s, mean gradient 12 mmHG, EOA 2.15 cm2, DI 0.62. Trivial regurgitation. The aortic valve has been repaired/replaced. Aortic valve regurgitation is trivial. Aortic valve mean gradient measures 12.0 mmHg. Aortic valve peak gradient measures 22.3 mmHg. Aortic valve area, by VTI measures 2.15 cm. There is a 21 mm Edwards pericardial valve present in the aortic position. Procedure Date: 08/17/2015. Pulmonic Valve: The pulmonic valve was grossly normal. Pulmonic valve regurgitation is not visualized. No evidence of pulmonic stenosis. Aorta: The aortic root is normal in size and structure. Venous: The inferior vena cava is normal in size with greater than 50% respiratory variability, suggesting right atrial pressure of 3 mmHg. IAS/Shunts: The interatrial septum appears to be lipomatous. The atrial septum is grossly normal.  LEFT VENTRICLE PLAX 2D LVIDd:         3.90 cm LVIDs:         2.80 cm LV PW:         1.40 cm LV IVS:        1.30 cm LVOT diam:     2.10 cm LV SV:         104 LV SV Index:   55 LVOT Area:     3.46 cm  IVC IVC diam: 1.40 cm LEFT ATRIUM         Index LA diam:    3.10 cm 1.65 cm/m  AORTIC VALVE AV Area (Vmax):    2.03 cm AV Area (Vmean):   1.98 cm AV  Area (VTI):     2.15 cm AV Vmax:           236.00 cm/s AV Vmean:          162.000 cm/s AV VTI:            0.483 m AV Peak Grad:      22.3 mmHg AV Mean Grad:      12.0 mmHg LVOT Vmax:         138.00 cm/s LVOT Vmean:        92.800 cm/s LVOT VTI:          0.300 m LVOT/AV VTI ratio: 0.62  AORTA Ao Root diam: 2.80 cm MITRAL VALVE                TRICUSPID VALVE MV Area (PHT): 4.06 cm     TR Peak grad:   25.4 mmHg MV Decel Time: 187 msec     TR Vmax:        252.00 cm/s MV E velocity: 104.00 cm/s MV A velocity: 106.00 cm/s  SHUNTS MV E/A ratio:  0.98         Systemic VTI:  0.30 m                             Systemic Diam:  2.10 cm Eleonore Chiquito MD Electronically signed by Eleonore Chiquito MD Signature Date/Time: 02/05/2020/1:02:24 PM    Final

## 2020-02-07 NOTE — Plan of Care (Signed)
Patient is currently resting in bed. OOB ambulating to BR. VSS. Remains on room air. Call bell within reach.   Problem: Education: Goal: Knowledge of General Education information will improve Description: Including pain rating scale, medication(s)/side effects and non-pharmacologic comfort measures Outcome: Progressing   Problem: Health Behavior/Discharge Planning: Goal: Ability to manage health-related needs will improve Outcome: Progressing   Problem: Clinical Measurements: Goal: Ability to maintain clinical measurements within normal limits will improve Outcome: Progressing Goal: Will remain free from infection Outcome: Progressing Goal: Diagnostic test results will improve Outcome: Progressing Goal: Respiratory complications will improve Outcome: Progressing Goal: Cardiovascular complication will be avoided Outcome: Progressing   Problem: Activity: Goal: Risk for activity intolerance will decrease Outcome: Progressing   Problem: Nutrition: Goal: Adequate nutrition will be maintained Outcome: Progressing   Problem: Coping: Goal: Level of anxiety will decrease Outcome: Progressing   Problem: Elimination: Goal: Will not experience complications related to bowel motility Outcome: Progressing Goal: Will not experience complications related to urinary retention Outcome: Progressing   Problem: Pain Managment: Goal: General experience of comfort will improve Outcome: Progressing   Problem: Safety: Goal: Ability to remain free from injury will improve Outcome: Progressing   Problem: Skin Integrity: Goal: Risk for impaired skin integrity will decrease Outcome: Progressing

## 2020-02-07 NOTE — Progress Notes (Signed)
Patient scheduled for outpatient Remdesivir infusions at 11am on Tuesday 11/23 and Wednesday 11/24 at [facility]. Please inform the patient to park at [place], as staff will be escorting the patient through the [place] entrance of the hospital. Appointments take approximately 45 minutes.    There is a wave flag banner located near the entrance on [address]. Turn into this entrance and immediately turn left or right and park in 1 of the 10 designated Covid Infusion Parking spots. There is a phone number on the sign, please call and let the staff know what spot you are in and we will come out and get you. For questions call [phone].  Thanks.

## 2020-02-08 DIAGNOSIS — R55 Syncope and collapse: Secondary | ICD-10-CM | POA: Diagnosis not present

## 2020-02-08 LAB — COMPREHENSIVE METABOLIC PANEL
ALT: 38 U/L (ref 0–44)
AST: 45 U/L — ABNORMAL HIGH (ref 15–41)
Albumin: 2.8 g/dL — ABNORMAL LOW (ref 3.5–5.0)
Alkaline Phosphatase: 90 U/L (ref 38–126)
Anion gap: 12 (ref 5–15)
BUN: 24 mg/dL — ABNORMAL HIGH (ref 8–23)
CO2: 24 mmol/L (ref 22–32)
Calcium: 9.5 mg/dL (ref 8.9–10.3)
Chloride: 99 mmol/L (ref 98–111)
Creatinine, Ser: 0.98 mg/dL (ref 0.44–1.00)
GFR, Estimated: >60 mL/min (ref >60)
Glucose, Bld: 145 mg/dL — ABNORMAL HIGH (ref 70–99)
Potassium: 4 mmol/L (ref 3.5–5.1)
Sodium: 135 mmol/L (ref 135–145)
Total Bilirubin: 0.6 mg/dL (ref 0.3–1.2)
Total Protein: 6 g/dL — ABNORMAL LOW (ref 6.5–8.1)

## 2020-02-08 LAB — CBC WITH DIFFERENTIAL/PLATELET
Abs Immature Granulocytes: 0.11 10*3/uL — ABNORMAL HIGH (ref 0.00–0.07)
Basophils Absolute: 0 10*3/uL (ref 0.0–0.1)
Basophils Relative: 0 %
Eosinophils Absolute: 0 10*3/uL (ref 0.0–0.5)
Eosinophils Relative: 0 %
HCT: 32.3 % — ABNORMAL LOW (ref 36.0–46.0)
Hemoglobin: 10.9 g/dL — ABNORMAL LOW (ref 12.0–15.0)
Immature Granulocytes: 1 %
Lymphocytes Relative: 6 %
Lymphs Abs: 0.7 10*3/uL (ref 0.7–4.0)
MCH: 29.8 pg (ref 26.0–34.0)
MCHC: 33.7 g/dL (ref 30.0–36.0)
MCV: 88.3 fL (ref 80.0–100.0)
Monocytes Absolute: 0.8 10*3/uL (ref 0.1–1.0)
Monocytes Relative: 7 %
Neutro Abs: 10.1 10*3/uL — ABNORMAL HIGH (ref 1.7–7.7)
Neutrophils Relative %: 86 %
Platelets: 297 10*3/uL (ref 150–400)
RBC: 3.66 MIL/uL — ABNORMAL LOW (ref 3.87–5.11)
RDW: 12.7 % (ref 11.5–15.5)
WBC: 11.7 10*3/uL — ABNORMAL HIGH (ref 4.0–10.5)
nRBC: 0 % (ref 0.0–0.2)

## 2020-02-08 LAB — CULTURE, BLOOD (ROUTINE X 2)
Culture: NO GROWTH
Culture: NO GROWTH
Special Requests: ADEQUATE

## 2020-02-08 LAB — D-DIMER, QUANTITATIVE: D-Dimer, Quant: 0.8 ug/mL-FEU — ABNORMAL HIGH (ref 0.00–0.50)

## 2020-02-08 LAB — MAGNESIUM: Magnesium: 2 mg/dL (ref 1.7–2.4)

## 2020-02-08 LAB — FERRITIN: Ferritin: 340 ng/mL — ABNORMAL HIGH (ref 11–307)

## 2020-02-08 LAB — C-REACTIVE PROTEIN: CRP: 2 mg/dL — ABNORMAL HIGH (ref <1.0)

## 2020-02-08 LAB — GLUCOSE, CAPILLARY: Glucose-Capillary: 147 mg/dL — ABNORMAL HIGH (ref 70–99)

## 2020-02-08 MED ORDER — METHYLPREDNISOLONE 4 MG PO TBPK: ORAL_TABLET | ORAL | 0 refills | Status: DC

## 2020-02-08 MED ORDER — ALBUTEROL SULFATE HFA 108 (90 BASE) MCG/ACT IN AERS: 2.0000 | INHALATION_SPRAY | Freq: Four times a day (QID) | RESPIRATORY_TRACT | 0 refills | Status: AC | PRN

## 2020-02-08 NOTE — Progress Notes (Signed)
Occupational Therapy Treatment Patient Details Name: Heidi Middleton MRN: [id number] DOB: [birth year] Today's Date: 02/08/2020    History of present illness 74 y.o. female with PMH significant of HTN, HLD, AVR, presented 02/03/20 with nausea, vomiting and diarrhea. reports she passed out x 5 minutes. Patient not vaccinated for COVID; tested +COVID   OT comments  Patient continues to make steady progress towards goals in skilled OT session. Patient's session encompassed ADLs, functional mobility, and education with regard to energy conservation techniques. Pt continues to make progress with all tasks attempted, with supervision for safety in completing tasks, but is internally aware of need to take seated rest breaks or pause in conversation to complete pursed lip breathing. Discharge remains appropriate, will continue to follow acutely.   Vitals: BP 166/79 seated EOB, O2 sats on RA remained above 92 throughout session with limited mobility. RR upwards of 22 in sitting but with cues could decrease to 14.    Follow Up Recommendations  No OT follow up    Equipment Recommendations  Tub/shower seat    Recommendations for Other Services      Precautions / Restrictions Precautions Precautions: None Restrictions Weight Bearing Restrictions: No       Mobility Bed Mobility Overal bed mobility: Needs Assistance             General bed mobility comments: sitting EOB on arrival  Transfers Overall transfer level: Needs assistance Equipment used: None Transfers: Sit to/from Stand Sit to Stand: Supervision         General transfer comment: for safety only, no physical assist    Balance                                           ADL either performed or assessed with clinical judgement   ADL Overall ADL's : Modified independent                                       General ADL Comments: Pt demonstrates ability to complete ADL at mod I level  (increased time, no device) but requires cues to implement energy conservation skills. Pt presents with poor activity tolerance from baseline. She was able to complete functional transfers and mobility without external assist, but required rest breaks     Vision       Perception     Praxis      Cognition Arousal/Alertness: Awake/alert Behavior During Therapy: WFL for tasks assessed/performed Overall Cognitive Status: Within Functional Limits for tasks assessed                                 General Comments: No noted impairments in memory or recall to date, able to have extended conversation with regard to garden as well as appropriate state recent events and sequences without external cueing        Exercises     Shoulder Instructions       General Comments      Pertinent Vitals/ Pain       Pain Assessment: No/denies pain  Home Living  Prior Functioning/Environment              Frequency           Progress Toward Goals  OT Goals(current goals can now be found in the care plan section)  Progress towards OT goals: Progressing toward goals  Acute Rehab OT Goals Patient Stated Goal: go home to husband OT Goal Formulation: With patient Time For Goal Achievement: 02/19/20 Potential to Achieve Goals: Good  Plan Discharge plan remains appropriate    Co-evaluation                 AM-PAC OT "6 Clicks" Daily Activity     Outcome Measure   Help from another person eating meals?: None Help from another person taking care of personal grooming?: None Help from another person toileting, which includes using toliet, bedpan, or urinal?: None Help from another person bathing (including washing, rinsing, drying)?: A Little Help from another person to put on and taking off regular upper body clothing?: None Help from another person to put on and taking off regular lower body clothing?:  None 6 Click Score: 23    End of Session    OT Visit Diagnosis: Unsteadiness on feet (R26.81);Other abnormalities of gait and mobility (R26.89)   Activity Tolerance Patient tolerated treatment well   Patient Left in chair;with call bell/phone within reach   Nurse Communication Mobility status        Time: 3374-4514 OT Time Calculation (min): 18 min  Charges: OT General Charges $OT Visit: 1 Visit OT Treatments $Self Care/Home Management : 8-22 mins  Corinne Ports E. Keyanna Sandefer, COTA/L Acute Rehabilitation Services [phone] [place] 02/08/2020, 11:09 AM

## 2020-02-08 NOTE — Discharge Summary (Signed)
Heidi Middleton UYE:[id number] DOB: 11-30-1945 DOA: 02/03/2020  PCP: Alroy Dust, L.Marlou Sa, MD  Admit date: 02/03/2020  Discharge date: 02/08/2020  Admitted From: Home   Disposition:  Home   Recommendations for Outpatient Follow-up:   Follow up with PCP in 1-2 weeks  PCP Please obtain BMP/CBC, 2 view CXR in 1week,  (see Discharge instructions)   PCP Please follow up on the following pending results:    Home Health: None   Equipment/Devices: none  Consultations: None  Discharge Condition: Stable    CODE STATUS: Full    Diet Recommendation: Heart Healthy   Diet Order            Diet - low sodium heart healthy           Diet Heart Room service appropriate? Yes; Fluid consistency: Thin  Diet effective now                  Chief Complaint  Patient presents with  . Covid Positive    n/v, diarrhea     Brief history of present illness from the day of admission and additional interim summary    Patient is a 74 y.o. 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  She was managed with supportive care-GI symptoms have resolved-however she is now starting to develop more cough and respiratory symptoms-see below.    COVID-19 vaccinated status: Unvaccinated  Significant Events: 11/17>> Admit to [facility] for syncope-nausea/vomiting/diarrhea x5 days-COVID-19 positive recently.  Significant studies: 11/17>>Chest x-ray: No pneumonia 11/19>> Echo: EF 65-70%-normal gradients across prosthetic valve. 11/19 chest x-ray: Peripheral consolidative changes in the right lateral chest-superimposed on background of  pulmonary emphysema. 11/20>> chest x-ray: Unchanged right lower lung airspace disease.  COVID-19 medications: 11/17>>MAB x1 Steroids: 11/20>> Remdesivir: 11/20>>  Antibiotics: None  Microbiology data: 11/17 >>blood culture: No growth.                                                                 Hospital Course   Syncope: Suspicion for vasovagal mechanism-telemetry negative-orthostatic vital signs negative-echo with preserved EF-no major issues with the prosthetic valve on echo.  Could have been dehydrated initially which could have caused her symptoms, did fine with PT OT feels much better.  Discharged home with outpatient PCP follow-up.   Nausea/vomiting/diarrhea: Secondary to COVID-19 infection-has resolved.  Acute hypoxic respiratory failure due to COVID-19 pneumonia:  She is unfortunately not vaccinated initially had some COVID-19 gastroenteritis which has resolved, has mild to moderate parenchymal lung injury.  Currently on steroids and Remdesivir infusion with good response, continue to monitor.  Stable on room air and symptom-free, will be discharged home  and she will complete outpatient infusions on 02/09/2020 and 02/10/2020.  Non-anion gap metabolic acidosis: Secondary to diarrhea-resolved after IV fluids and improvement in diarrhea.  Bovine AVR done in 2017.  Stable.    SpO2: 95 %  Recent Labs  Lab 02/03/20 1410 02/03/20 1500 02/03/20 1523 02/03/20 2127 02/04/20 0244 02/05/20 0108 02/06/20 0134 02/07/20 0409  WBC 1.8*  --   --   --  2.7* 2.6* 2.8* 3.0*  CRP  --  3.5*  --   --  5.1* 5.5* 6.5* 5.8*  DDIMER  --  1.04*  --   --  0.68* 0.67* 0.86* 0.95*  PROCALCITON <0.10  --   --   --   --  <0.10 <0.10 <0.10  LATICACIDVEN 0.9  --   --  1.5  --   --   --   --   AST 32  --   --   --  43* 46* 43* 42*  ALT 21  --   --   --  29 30 28 30   ALKPHOS 69  --   --   --  87 80 74 84  BILITOT 0.5  --   --   --  0.5 0.4 0.5 0.4  ALBUMIN 2.2*  --   --   --  3.0* 2.7*  2.6* 2.4*  SARSCOV2NAA  --   --  POSITIVE*  --   --   --   --   --       Pancytopenia: Secondary to COVID-19 infection-improving slowly.  Request PCP to repeat CBC in 7 to 10 days post discharge.  HLD: Continue statin  HTN: BP stable-continue lisinopril and metoprolol   Discharge diagnosis     Active Problems:   COVID    Discharge instructions    Discharge Instructions    Diet - low sodium heart healthy   Complete by: As directed    Discharge instructions   Complete by: As directed    Follow with Primary MD Alroy Dust, L.Marlou Sa, MD in 7 days   Get CBC, CMP, 2 view Chest X ray -  checked next visit within 1 week by Primary MD   Activity: As tolerated with Full fall precautions use walker/cane & assistance as needed  Disposition Home   Diet: Heart Healthy    Special Instructions: If you have smoked or chewed Tobacco  in the last 2 yrs please stop smoking, stop any regular Alcohol  and or any Recreational drug use.  On your next visit with your primary care physician please Get Medicines reviewed and adjusted.  Please request your Prim.MD to go over all Hospital Tests and Procedure/Radiological results at the follow up, please get all Hospital records sent to your Prim MD by signing hospital release before you go home.  If you experience worsening of your admission symptoms, develop shortness of breath, life threatening emergency, suicidal or homicidal thoughts you must seek medical attention immediately by calling 911 or calling your MD immediately  if symptoms less severe.  You Must read complete instructions/literature along with all the possible adverse reactions/side effects for all the Medicines you take and that have been prescribed to you. Take any new Medicines after you have completely understood and accpet all the possible adverse reactions/side effects.   Increase activity slowly   Complete by: As directed       Discharge Medications   Allergies as of  02/08/2020      Reactions   Codeine Nausea And Vomiting  Medication List    TAKE these medications   acetaminophen 325 MG tablet Commonly known as: TYLENOL Take 650 mg by mouth every 6 (six) hours as needed for mild pain, moderate pain, fever or headache.   albuterol 108 (90 Base) MCG/ACT inhaler Commonly known as: VENTOLIN HFA Inhale 2 puffs into the lungs every 6 (six) hours as needed for wheezing or shortness of breath.   aspirin EC 81 MG tablet Take 1 tablet (81 mg total) by mouth daily.   atorvastatin 10 MG tablet Commonly known as: LIPITOR TAKE 1/2 TABLET BY MOUTH EVERY DAY. NEEDS OFFICE VISIT What changed:   how much to take  how to take this  when to take this   cholecalciferol 1000 units tablet Commonly known as: VITAMIN D Take 1,000 Units by mouth 2 (two) times daily.   esomeprazole 40 MG capsule Commonly known as: NEXIUM TAKE 1 CAPSULE BY MOUTH EVERY DAY BEFORE BREAKFAST What changed:   how much to take  how to take this  when to take this   FIBER LAXATIVE PO Take 2 tablets by mouth 2 (two) times a day.   fluticasone 50 MCG/ACT nasal spray Commonly known as: FLONASE Place 1-2 sprays into both nostrils daily as needed for rhinitis.   hydrochlorothiazide 25 MG tablet Commonly known as: HYDRODIURIL Take 1 tablet (25 mg total) by mouth 3 (three) times a week.   lisinopril 20 MG tablet Commonly known as: ZESTRIL TAKE 1 TABLET BY MOUTH EVERY DAY   methylPREDNISolone 4 MG Tbpk tablet Commonly known as: MEDROL DOSEPAK follow package directions   metoprolol tartrate 25 MG tablet Commonly known as: LOPRESSOR TAKE 1 TABLET BY MOUTH TWICE A DAY   multivitamin with minerals Tabs tablet Take 1 tablet by mouth daily.   omega-3 acid ethyl esters 1 g capsule Commonly known as: LOVAZA Take 1 g by mouth 2 (two) times daily.   potassium chloride 10 MEQ tablet Commonly known as: KLOR-CON Take 1 tablet (10 mEq total) by mouth 3 (three) times a  week. Please call office to schedule appointment for February.        Follow-up Information    Alroy Dust, L.Marlou Sa, MD. Schedule an appointment as soon as possible for a visit in 1 week(s).   Specialty: Family Medicine Contact information: [address] [place] [address] [phone]               Major procedures and Radiology Reports - PLEASE review detailed and final reports thoroughly  -       DG Chest [facility] 1 View  Result Date: 02/05/2020 CLINICAL DATA:  Shortness of breath, history of COVID EXAM: PORTABLE CHEST 1 VIEW COMPARISON:  02/03/2020 FINDINGS: Post median sternotomy. Tracheal air column in the midline. Cardiomediastinal contours and hilar structures are stable. Developing peripheral consolidative changes in the RIGHT lateral chest likely in lateral middle lobe or lower lobe. Lungs remain hyperinflated. No sign of effusion. On limited assessment skeletal structures without acute process. IMPRESSION: Peripheral consolidative changes in the RIGHT lateral chest, a finding that could be seen in the setting of COVID 19 infection as developed since the previous exam. Findings are likely superimposed on background pulmonary emphysema and are nonspecific given unilateral changes. Typical pneumonia could also present with this appearance. Electronically Signed   By: Zetta Bills M.D.   On: 02/05/2020 08:10   DG Chest Port 1 View  Result Date: 02/03/2020 CLINICAL DATA:  COVID-19 positive EXAM: PORTABLE CHEST 1 VIEW COMPARISON:  November 10, 2019 FINDINGS: Lungs are clear. Heart size and pulmonary vascularity are normal. Patient is status post median sternotomy. No adenopathy. No bone lesions. IMPRESSION: Lungs clear.  Heart size normal.  No adenopathy. Electronically Signed   By: Lowella Grip III M.D.   On: 02/03/2020 15:06   DG Chest Port 1V same Day  Result Date: 02/06/2020 CLINICAL DATA:  Follow-up pneumonia.  COVID-19 positive. EXAM: PORTABLE CHEST 1  VIEW COMPARISON:  02/05/2020 and prior radiographs FINDINGS: UPPER limits normal heart size and cardiac valve replacement changes again noted. RIGHT LOWER lung airspace disease is unchanged. No new pulmonary opacities are identified. No pleural effusion or pneumothorax noted. IMPRESSION: Unchanged RIGHT LOWER lung airspace disease. Electronically Signed   By: Margarette Canada M.D.   On: 02/06/2020 10:43   ECHOCARDIOGRAM LIMITED  Result Date: 02/05/2020    ECHOCARDIOGRAM REPORT   Patient Name:   Heidi Middleton Date of Exam: 02/05/2020 Medical Rec #:  [id number]      Height:       68.0 in Accession #:    [id number]     Weight:       164.9 lb Date of Birth:  05-31-1945      BSA:          1.883 m Patient Age:    74 years       BP:           133/48 mmHg Patient Gender: F              HR:           80 bpm. Exam Location:  Inpatient Procedure: Limited Echo, Cardiac Doppler and Color Doppler Indications:    Syncope  History:        Patient has prior history of Echocardiogram examinations, most                 recent 11/26/2018. Aortic Valve Disease; Risk Factors:Hypertension                 and Dyslipidemia. S/P AVR. VALVE MAGNA EASE 21MM.                 Aortic Valve: 21 mm Edwards pericardial valve is present in the                 aortic position. Procedure Date: 08/17/2015.  Sonographer:    Clayton Lefort RDCS (AE) Referring Phys: [place]  1. Left ventricular ejection fraction, by estimation, is 65 to 70%. The left ventricle has normal function. The left ventricle has no regional wall motion abnormalities. There is mild concentric left ventricular hypertrophy. Left ventricular diastolic function could not be evaluated.  2. Right ventricular systolic function is normal. The right ventricular size is normal. There is normal pulmonary artery systolic pressure. The estimated right ventricular systolic pressure is 47.0 mmHg.  3. The mitral valve is grossly normal. No evidence of mitral valve regurgitation.  No evidence of mitral stenosis.  4. 21 mm pericardial tissue aortic valve. Vmax 2.3 m/s, mean gradient 12 mmHG, EOA 2.15 cm2, DI 0.62. Trivial regurgitation. The aortic valve has been repaired/replaced. Aortic valve regurgitation is trivial. There is a 21 mm Edwards pericardial valve present in the aortic position. Procedure Date: 08/17/2015.  5. The inferior vena cava is normal in size with greater than 50% respiratory variability, suggesting right atrial pressure of 3 mmHg. Comparison(s): No significant change from prior study. Normal LV function. Normal gradients across prosthesis with  trivial central regurgitation. FINDINGS  Left Ventricle: Left ventricular ejection fraction, by estimation, is 65 to 70%. The left ventricle has normal function. The left ventricle has no regional wall motion abnormalities. The left ventricular internal cavity size was normal in size. There is  mild concentric left ventricular hypertrophy. Abnormal (paradoxical) septal motion consistent with post-operative status. Left ventricular diastolic function could not be evaluated. Right Ventricle: The right ventricular size is normal. No increase in right ventricular wall thickness. Right ventricular systolic function is normal. There is normal pulmonary artery systolic pressure. The tricuspid regurgitant velocity is 2.52 m/s, and  with an assumed right atrial pressure of 3 mmHg, the estimated right ventricular systolic pressure is 88.5 mmHg. Left Atrium: Left atrial size was normal in size. Right Atrium: Right atrial size was normal in size. Pericardium: There is no evidence of pericardial effusion. Presence of pericardial fat pad. Mitral Valve: The mitral valve is grossly normal. No evidence of mitral valve regurgitation. No evidence of mitral valve stenosis. Tricuspid Valve: The tricuspid valve is grossly normal. Tricuspid valve regurgitation is trivial. No evidence of tricuspid stenosis. Aortic Valve: 21 mm pericardial tissue aortic  valve. Vmax 2.3 m/s, mean gradient 12 mmHG, EOA 2.15 cm2, DI 0.62. Trivial regurgitation. The aortic valve has been repaired/replaced. Aortic valve regurgitation is trivial. Aortic valve mean gradient measures 12.0 mmHg. Aortic valve peak gradient measures 22.3 mmHg. Aortic valve area, by VTI measures 2.15 cm. There is a 21 mm Edwards pericardial valve present in the aortic position. Procedure Date: 08/17/2015. Pulmonic Valve: The pulmonic valve was grossly normal. Pulmonic valve regurgitation is not visualized. No evidence of pulmonic stenosis. Aorta: The aortic root is normal in size and structure. Venous: The inferior vena cava is normal in size with greater than 50% respiratory variability, suggesting right atrial pressure of 3 mmHg. IAS/Shunts: The interatrial septum appears to be lipomatous. The atrial septum is grossly normal.  LEFT VENTRICLE PLAX 2D LVIDd:         3.90 cm LVIDs:         2.80 cm LV PW:         1.40 cm LV IVS:        1.30 cm LVOT diam:     2.10 cm LV SV:         104 LV SV Index:   55 LVOT Area:     3.46 cm  IVC IVC diam: 1.40 cm LEFT ATRIUM         Index LA diam:    3.10 cm 1.65 cm/m  AORTIC VALVE AV Area (Vmax):    2.03 cm AV Area (Vmean):   1.98 cm AV Area (VTI):     2.15 cm AV Vmax:           236.00 cm/s AV Vmean:          162.000 cm/s AV VTI:            0.483 m AV Peak Grad:      22.3 mmHg AV Mean Grad:      12.0 mmHg LVOT Vmax:         138.00 cm/s LVOT Vmean:        92.800 cm/s LVOT VTI:          0.300 m LVOT/AV VTI ratio: 0.62  AORTA Ao Root diam: 2.80 cm MITRAL VALVE                TRICUSPID VALVE MV Area (PHT): 4.06 cm  TR Peak grad:   25.4 mmHg MV Decel Time: 187 msec     TR Vmax:        252.00 cm/s MV E velocity: 104.00 cm/s MV A velocity: 106.00 cm/s  SHUNTS MV E/A ratio:  0.98         Systemic VTI:  0.30 m                             Systemic Diam: 2.10 cm Eleonore Chiquito MD Electronically signed by Eleonore Chiquito MD Signature Date/Time: 02/05/2020/1:02:24 PM    Final      Micro Results     Recent Results (from the past 240 hour(s))  Blood Culture (routine x 2)     Status: None (Preliminary result)   Collection Time: 02/03/20  3:00 PM   Specimen: BLOOD RIGHT ARM  Result Value Ref Range Status   Specimen Description BLOOD RIGHT ARM  Final   Special Requests   Final    BOTTLES DRAWN AEROBIC AND ANAEROBIC Blood Culture adequate volume   Culture   Final    NO GROWTH 4 DAYS Performed at [facility], [place] [address] [place], [place] [address]    Report Status PENDING  Incomplete  Respiratory Panel by RT PCR (Flu A&B, Covid) - Nasopharyngeal Swab     Status: Abnormal   Collection Time: 02/03/20  3:23 PM   Specimen: Nasopharyngeal Swab  Result Value Ref Range Status   SARS Coronavirus 2 by RT PCR POSITIVE (A) NEGATIVE Final    Comment: RESULT CALLED TO, READ BACK BY AND VERIFIED WITH: C BLACK RN 02/03/20 AT 1738 SK (NOTE) SARS-CoV-2 target nucleic acids are DETECTED.  SARS-CoV-2 RNA is generally detectable in upper respiratory specimens  during the acute phase of infection. Positive results are indicative of the presence of the identified virus, but do not rule out bacterial infection or co-infection with other pathogens not detected by the test. Clinical correlation with patient history and other diagnostic information is necessary to determine patient infection status. The expected result is Negative.  Fact Sheet for Patients:  PinkCheek.be  Fact Sheet for Healthcare Providers: GravelBags.it  This test is not yet approved or cleared by the [place] FDA and  has been authorized for detection and/or diagnosis of SARS-CoV-2 by FDA under an Emergency Use Authorization (EUA).  This EUA will remain in effect (meaning this test can be used ) for the duration of  the COVID-19 declaration under Section 564(b)(1) of the Act, 21 U.S.C. section 360bbb-3(b)(1), unless the authorization  is terminated or revoked sooner.      Influenza A by PCR NEGATIVE NEGATIVE Final   Influenza B by PCR NEGATIVE NEGATIVE Final    Comment: (NOTE) The Xpert Xpress SARS-CoV-2/FLU/RSV assay is intended as an aid in  the diagnosis of influenza from Nasopharyngeal swab specimens and  should not be used as a sole basis for treatment. Nasal washings and  aspirates are unacceptable for Xpert Xpress SARS-CoV-2/FLU/RSV  testing.  Fact Sheet for Patients: PinkCheek.be  Fact Sheet for Healthcare Providers: GravelBags.it  This test is not yet approved or cleared by the [place] FDA and  has been authorized for detection and/or diagnosis of SARS-CoV-2 by  FDA under an Emergency Use Authorization (EUA). This EUA will remain  in effect (meaning this test can be used) for the duration of the  Covid-19 declaration under Section 564(b)(1) of the Act, 21  U.S.C. section 360bbb-3(b)(1),  unless the authorization is  terminated or revoked. Performed at [facility], [place] [address] [place], [place] [address]   Blood Culture (routine x 2)     Status: None (Preliminary result)   Collection Time: 02/03/20  5:55 PM   Specimen: BLOOD RIGHT HAND  Result Value Ref Range Status   Specimen Description BLOOD RIGHT HAND  Final   Special Requests   Final    BOTTLES DRAWN AEROBIC AND ANAEROBIC Blood Culture results may not be optimal due to an inadequate volume of blood received in culture bottles   Culture   Final    NO GROWTH 4 DAYS Performed at [facility], [place] [address] [place], [place] [address]    Report Status PENDING  Incomplete    Today   Subjective    Heidi Middleton today has no headache,no chest abdominal pain,no new weakness tingling or numbness, feels much better wants to go home today.    Objective   Blood pressure (!) 158/69, pulse 77, temperature 98 F (36.7 C), temperature source Oral, resp. rate 19, height  5\' 8"  (1.727 m), weight 74.8 kg, SpO2 95 %.   Intake/Output Summary (Last 24 hours) at 02/08/2020 0902 Last data filed at 02/08/2020 0300 Gross per 24 hour  Intake 220 ml  Output --  Net 220 ml    Exam  Awake Alert, No new F.N deficits, Normal affect [place].AT,PERRAL Supple Neck,No JVD, No cervical lymphadenopathy appriciated.  Symmetrical Chest wall movement, Good air movement bilaterally, CTAB RRR,No Gallops,Rubs or new Murmurs, No Parasternal Heave +ve B.Sounds, Abd Soft, Non tender, No organomegaly appriciated, No rebound -guarding or rigidity. No Cyanosis, Clubbing or edema, No new Rash or bruise   Data Review   CBC w Diff:  Lab Results  Component Value Date   WBC 3.0 (L) 02/07/2020   HGB 10.2 (L) 02/07/2020   HGB 12.0 07/28/2015   HCT 29.8 (L) 02/07/2020   HCT 36.3 07/28/2015   PLT 191 02/07/2020   PLT 250 07/28/2015   LYMPHOPCT 12 02/07/2020   MONOPCT 5 02/07/2020   EOSPCT 0 02/07/2020   BASOPCT 0 02/07/2020    CMP:  Lab Results  Component Value Date   NA 132 (L) 02/07/2020   NA 141 06/12/2016   K 3.9 02/07/2020   CL 96 (L) 02/07/2020   CO2 25 02/07/2020   BUN 15 02/07/2020   BUN 14 06/12/2016   CREATININE 0.81 02/07/2020   PROT 5.5 (L) 02/07/2020   ALBUMIN 2.4 (L) 02/07/2020   BILITOT 0.4 02/07/2020   ALKPHOS 84 02/07/2020   AST 42 (H) 02/07/2020   ALT 30 02/07/2020  .   Total Time in preparing paper work, data evaluation and todays exam - 43 minutes  Lala Lund M.D on 02/08/2020 at 9:02 AM  [facility]   Office  [phone]

## 2020-02-08 NOTE — Discharge Instructions (Signed)
Follow with Primary MD Alroy Dust, L.Marlou Sa, MD in 7 days   Get CBC, CMP, 2 view Chest X ray -  checked next visit within 1 week by Primary MD   Activity: As tolerated with Full fall precautions use walker/cane & assistance as needed  Disposition Home   Diet: Heart Healthy    Special Instructions: If you have smoked or chewed Tobacco  in the last 2 yrs please stop smoking, stop any regular Alcohol  and or any Recreational drug use.  On your next visit with your primary care physician please Get Medicines reviewed and adjusted.  Please request your Prim.MD to go over all Hospital Tests and Procedure/Radiological results at the follow up, please get all Hospital records sent to your Prim MD by signing hospital release before you go home.  If you experience worsening of your admission symptoms, develop shortness of breath, life threatening emergency, suicidal or homicidal thoughts you must seek medical attention immediately by calling 911 or calling your MD immediately  if symptoms less severe.  You Must read complete instructions/literature along with all the possible adverse reactions/side effects for all the Medicines you take and that have been prescribed to you. Take any new Medicines after you have completely understood and accpet all the possible adverse reactions/side effects.        Person Under Monitoring Name: Heidi Middleton  Location: [place] [address]   Infection Prevention Recommendations for Individuals Confirmed to have, or Being Evaluated for, 2019 Novel Coronavirus (COVID-19) Infection Who Receive Care at Home  Individuals who are confirmed to have, or are being evaluated for, COVID-19 should follow the prevention steps below until a healthcare provider or local or state health department says they can return to normal activities.  Stay home except to get medical care You should restrict activities outside your home, except for getting medical  care. Do not go to work, school, or public areas, and do not use public transportation or taxis.  Call ahead before visiting your doctor Before your medical appointment, call the healthcare provider and tell them that you have, or are being evaluated for, COVID-19 infection. This will help the healthcare provider's office take steps to keep other people from getting infected. Ask your healthcare provider to call the local or state health department.  Monitor your symptoms Seek prompt medical attention if your illness is worsening (e.g., difficulty breathing). Before going to your medical appointment, call the healthcare provider and tell them that you have, or are being evaluated for, COVID-19 infection. Ask your healthcare provider to call the local or state health department.  Wear a facemask You should wear a facemask that covers your nose and mouth when you are in the same room with other people and when you visit a healthcare provider. People who live with or visit you should also wear a facemask while they are in the same room with you.  Separate yourself from other people in your home As much as possible, you should stay in a different room from other people in your home. Also, you should use a separate bathroom, if available.  Avoid sharing household items You should not share dishes, drinking glasses, cups, eating utensils, towels, bedding, or other items with other people in your home. After using these items, you should wash them thoroughly with soap and water.  Cover your coughs and sneezes Cover your mouth and nose with a tissue when you cough or sneeze, or you can cough or sneeze  into your sleeve. Throw used tissues in a lined trash can, and immediately wash your hands with soap and water for at least 20 seconds or use an alcohol-based hand rub.  Wash your [facility] your hands often and thoroughly with soap and water for at least 20 seconds. You can use an alcohol-based  hand sanitizer if soap and water are not available and if your hands are not visibly dirty. Avoid touching your eyes, nose, and mouth with unwashed hands.   Prevention Steps for Caregivers and Household Members of Individuals Confirmed to have, or Being Evaluated for, COVID-19 Infection Being Cared for in the Home  If you live with, or provide care at home for, a person confirmed to have, or being evaluated for, COVID-19 infection please follow these guidelines to prevent infection:  Follow healthcare provider's instructions Make sure that you understand and can help the patient follow any healthcare provider instructions for all care.  Provide for the patient's basic needs You should help the patient with basic needs in the home and provide support for getting groceries, prescriptions, and other personal needs.  Monitor the patient's symptoms If they are getting sicker, call his or her medical provider and tell them that the patient has, or is being evaluated for, COVID-19 infection. This will help the healthcare provider's office take steps to keep other people from getting infected. Ask the healthcare provider to call the local or state health department.  Limit the number of people who have contact with the patient  If possible, have only one caregiver for the patient.  Other household members should stay in another home or place of residence. If this is not possible, they should stay  in another room, or be separated from the patient as much as possible. Use a separate bathroom, if available.  Restrict visitors who do not have an essential need to be in the home.  Keep older adults, very young children, and other sick people away from the patient Keep older adults, very young children, and those who have compromised immune systems or chronic health conditions away from the patient. This includes people with chronic heart, lung, or kidney conditions, diabetes, and  cancer.  Ensure good ventilation Make sure that shared spaces in the home have good air flow, such as from an air conditioner or an opened window, weather permitting.  Wash your hands often  Wash your hands often and thoroughly with soap and water for at least 20 seconds. You can use an alcohol based hand sanitizer if soap and water are not available and if your hands are not visibly dirty.  Avoid touching your eyes, nose, and mouth with unwashed hands.  Use disposable paper towels to dry your hands. If not available, use dedicated cloth towels and replace them when they become wet.  Wear a facemask and gloves  Wear a disposable facemask at all times in the room and gloves when you touch or have contact with the patient's blood, body fluids, and/or secretions or excretions, such as sweat, saliva, sputum, nasal mucus, vomit, urine, or feces.  Ensure the mask fits over your nose and mouth tightly, and do not touch it during use.  Throw out disposable facemasks and gloves after using them. Do not reuse.  Wash your hands immediately after removing your facemask and gloves.  If your personal clothing becomes contaminated, carefully remove clothing and launder. Wash your hands after handling contaminated clothing.  Place all used disposable facemasks, gloves, and other waste in  a lined container before disposing them with other household waste.  Remove gloves and wash your hands immediately after handling these items.  Do not share dishes, glasses, or other household items with the patient  Avoid sharing household items. You should not share dishes, drinking glasses, cups, eating utensils, towels, bedding, or other items with a patient who is confirmed to have, or being evaluated for, COVID-19 infection.  After the person uses these items, you should wash them thoroughly with soap and water.  Wash laundry thoroughly  Immediately remove and wash clothes or bedding that have blood, body  fluids, and/or secretions or excretions, such as sweat, saliva, sputum, nasal mucus, vomit, urine, or feces, on them.  Wear gloves when handling laundry from the patient.  Read and follow directions on labels of laundry or clothing items and detergent. In general, wash and dry with the warmest temperatures recommended on the label.  Clean all areas the individual has used often  Clean all touchable surfaces, such as counters, tabletops, doorknobs, bathroom fixtures, toilets, phones, keyboards, tablets, and bedside tables, every day. Also, clean any surfaces that may have blood, body fluids, and/or secretions or excretions on them.  Wear gloves when cleaning surfaces the patient has come in contact with.  Use a diluted bleach solution (e.g., dilute bleach with 1 part bleach and 10 parts water) or a household disinfectant with a label that says EPA-registered for coronaviruses. To make a bleach solution at home, add 1 tablespoon of bleach to 1 quart (4 cups) of water. For a larger supply, add  cup of bleach to 1 gallon (16 cups) of water.  Read labels of cleaning products and follow recommendations provided on product labels. Labels contain instructions for safe and effective use of the cleaning product including precautions you should take when applying the product, such as wearing gloves or eye protection and making sure you have good ventilation during use of the product.  Remove gloves and wash hands immediately after cleaning.  Monitor yourself for signs and symptoms of illness Caregivers and household members are considered close contacts, should monitor their health, and will be asked to limit movement outside of the home to the extent possible. Follow the monitoring steps for close contacts listed on the symptom monitoring form.   ? If you have additional questions, contact your local health department or call the epidemiologist on call at [phone] (available 24/7). ? This  guidance is subject to change. For the most up-to-date guidance from [facility], please refer to their website: [email or web address]   You are scheduled for outpatient Remdesivir infusions at 11am on Tuesday 11/23 and Wednesday 11/24 at [facility]. Please park at [place] [place], as staff will be escorting you through the [place] entrance of the hospital. Appointments take approximately 45 minutes.    The address for the infusion clinic site is:  --GPS address is [place] - the parking is located near [employer] building where you will see  COVID19 Infusion feather banner marking the entrance to parking.   (see photos below)            --Enter into the 2nd entrance where the "wave, flag banner" is at the road. Turn into this 2nd entrance and immediately turn left to park in 1 of the 5 parking spots.   --Please stay in your car and call the desk for assistance inside [phone].   The day of your visit you should: Marland Kitchen Get plenty of  rest the night before and drink plenty of water . Eat a light meal/snack before coming and take your medications as prescribed  . Wear warm, comfortable clothes with a shirt that can roll-up over the elbow (will need IV start).  . Wear a mask  . Consider bringing some activity to help pass the time

## 2020-02-08 NOTE — Care Management Important Message (Signed)
Important Message  Patient Details  Name: Heidi Middleton MRN: [id number] Date of Birth: Sep 15, 1945   Medicare Important Message Given:  Yes - Important Message mailed due to current National Emergency  Verbal consent obtained due to current National Emergency  Relationship to patient: Self Contact Name: Ngan Qualls Call Date: 02/08/20  Time: 1407 Phone: [phone] Outcome: No Answer/Busy Important Message mailed to: Patient address on file    Delorse Lek 02/08/2020, 2:07 PM

## 2020-02-09 ENCOUNTER — Telehealth: Payer: Self-pay | Admitting: Cardiovascular Disease

## 2020-02-09 ENCOUNTER — Ambulatory Visit (HOSPITAL_COMMUNITY)
Admit: 2020-02-09 | Discharge: 2020-02-09 | Disposition: A | Discharge status: Home / Self Care | Payer: Medicare PPO | Attending: Pulmonary Disease | Admitting: Pulmonary Disease

## 2020-02-09 DIAGNOSIS — U071 COVID-19: Secondary | ICD-10-CM | POA: Diagnosis not present

## 2020-02-09 MED ORDER — EPINEPHRINE 0.3 MG/0.3ML IJ SOAJ: 0.3000 mg | Freq: Once | INTRAMUSCULAR | Status: DC | PRN

## 2020-02-09 MED ORDER — FAMOTIDINE IN NACL 20-0.9 MG/50ML-% IV SOLN: 20.0000 mg | Freq: Once | INTRAVENOUS | Status: DC | PRN

## 2020-02-09 MED ORDER — ALBUTEROL SULFATE HFA 108 (90 BASE) MCG/ACT IN AERS: 2.0000 | INHALATION_SPRAY | Freq: Once | RESPIRATORY_TRACT | Status: DC | PRN

## 2020-02-09 MED ORDER — SODIUM CHLORIDE 0.9 % IV SOLN
100.0000 mg | Freq: Once | INTRAVENOUS | Status: AC
  Administered 2020-02-09: 100 mg via INTRAVENOUS

## 2020-02-09 MED ORDER — SODIUM CHLORIDE 0.9 % IV SOLN: INTRAVENOUS | Status: DC | PRN

## 2020-02-09 MED ORDER — METHYLPREDNISOLONE SODIUM SUCC 125 MG IJ SOLR: 125.0000 mg | Freq: Once | INTRAMUSCULAR | Status: DC | PRN

## 2020-02-09 MED ORDER — DIPHENHYDRAMINE HCL 50 MG/ML IJ SOLN: 50.0000 mg | Freq: Once | INTRAMUSCULAR | Status: DC | PRN

## 2020-02-09 NOTE — Progress Notes (Signed)
  Diagnosis: COVID-19  Physician: Dr. Joya Gaskins   Procedure: [facility] Infusion Clinic Med: remdesivir infusion - Provided patient with remdesivir fact sheet for patients, parents and caregivers prior to infusion.  Complications: No immediate complications noted.  Discharge: Discharged home   Heidi Middleton 02/09/2020

## 2020-02-09 NOTE — Telephone Encounter (Signed)
  Patient Consent for Virtual Visit         Heidi Middleton has provided verbal consent on 02/09/2020 for a virtual visit (video or telephone).   CONSENT FOR VIRTUAL VISIT FOR:  Heidi Middleton  By participating in this virtual visit I agree to the following:  I hereby voluntarily request, consent and authorize [facility] and its employed or contracted physicians, physician assistants, nurse practitioners or other licensed health care professionals (the Practitioner), to provide me with telemedicine health care services (the "Services") as deemed necessary by the treating Practitioner. I acknowledge and consent to receive the Services by the Practitioner via telemedicine. I understand that the telemedicine visit will involve communicating with the Practitioner through live audiovisual communication technology and the disclosure of certain medical information by electronic transmission. I acknowledge that I have been given the opportunity to request an in-person assessment or other available alternative prior to the telemedicine visit and am voluntarily participating in the telemedicine visit.  I understand that I have the right to withhold or withdraw my consent to the use of telemedicine in the course of my care at any time, without affecting my right to future care or treatment, and that the Practitioner or I may terminate the telemedicine visit at any time. I understand that I have the right to inspect all information obtained and/or recorded in the course of the telemedicine visit and may receive copies of available information for a reasonable fee.  I understand that some of the potential risks of receiving the Services via telemedicine include:  Marland Kitchen Delay or interruption in medical evaluation due to technological equipment failure or disruption; . Information transmitted may not be sufficient (e.g. poor resolution of images) to allow for appropriate medical decision making by the Practitioner;  and/or  . In rare instances, security protocols could fail, causing a breach of personal health information.  Furthermore, I acknowledge that it is my responsibility to provide information about my medical history, conditions and care that is complete and accurate to the best of my ability. I acknowledge that Practitioner's advice, recommendations, and/or decision may be based on factors not within their control, such as incomplete or inaccurate data provided by me or distortions of diagnostic images or specimens that may result from electronic transmissions. I understand that the practice of medicine is not an exact science and that Practitioner makes no warranties or guarantees regarding treatment outcomes. I acknowledge that a copy of this consent can be made available to me via my patient portal ([facility]), or I can request a printed copy by calling the office of [facility].    I understand that my insurance will be billed for this visit.   I have read or had this consent read to me. . I understand the contents of this consent, which adequately explains the benefits and risks of the Services being provided via telemedicine.  . I have been provided ample opportunity to ask questions regarding this consent and the Services and have had my questions answered to my satisfaction. . I give my informed consent for the services to be provided through the use of telemedicine in my medical care

## 2020-02-09 NOTE — Telephone Encounter (Signed)
Pt c/o BP issue: STAT if pt c/o blurred vision, one-sided weakness or slurred speech  1. What are your last 5 BP readings? Trending 170's last reading 175/76   2. Are you having any other symptoms (ex. Dizziness, headache, blurred vision, passed out)? Patient has covid pneumonia and is currently doing infusions feels fuzzy or off in head   3. What is your BP issue? Patient concerned about htn she has not taken hctz due to dehydration with covid she feels like its would make the dehydration worse.  Patient c/o swelling in feet and unable to fit shoes.   Scheduled for virtual visit Sharolyn Douglas 11/24 .  Patient notified  triage will call if another plan of care is suggested and notified she should also call back if symptoms worsen / change .

## 2020-02-09 NOTE — Telephone Encounter (Signed)
Patient is virtual with you tomorrow.

## 2020-02-10 ENCOUNTER — Other Ambulatory Visit: Payer: Self-pay

## 2020-02-10 ENCOUNTER — Telehealth (INDEPENDENT_AMBULATORY_CARE_PROVIDER_SITE_OTHER): Payer: Medicare PPO | Admitting: Nurse Practitioner

## 2020-02-10 ENCOUNTER — Encounter: Payer: Self-pay | Admitting: Nurse Practitioner

## 2020-02-10 ENCOUNTER — Ambulatory Visit (HOSPITAL_COMMUNITY)
Admit: 2020-02-10 | Discharge: 2020-02-10 | Disposition: A | Discharge status: Home / Self Care | Payer: Medicare PPO | Attending: Pulmonary Disease | Admitting: Pulmonary Disease

## 2020-02-10 VITALS — BP 145/72 | Ht 67.5 in | Wt 170.0 lb

## 2020-02-10 DIAGNOSIS — U071 COVID-19: Secondary | ICD-10-CM | POA: Diagnosis not present

## 2020-02-10 DIAGNOSIS — Z953 Presence of xenogenic heart valve: Secondary | ICD-10-CM

## 2020-02-10 DIAGNOSIS — I10 Essential (primary) hypertension: Secondary | ICD-10-CM

## 2020-02-10 MED ORDER — SODIUM CHLORIDE 0.9 % IV SOLN: INTRAVENOUS | Status: DC | PRN

## 2020-02-10 MED ORDER — METHYLPREDNISOLONE SODIUM SUCC 125 MG IJ SOLR: 125.0000 mg | Freq: Once | INTRAMUSCULAR | Status: DC | PRN

## 2020-02-10 MED ORDER — FAMOTIDINE IN NACL 20-0.9 MG/50ML-% IV SOLN: 20.0000 mg | Freq: Once | INTRAVENOUS | Status: DC | PRN

## 2020-02-10 MED ORDER — EPINEPHRINE 0.3 MG/0.3ML IJ SOAJ: 0.3000 mg | Freq: Once | INTRAMUSCULAR | Status: DC | PRN

## 2020-02-10 MED ORDER — SODIUM CHLORIDE 0.9 % IV SOLN
100.0000 mg | Freq: Once | INTRAVENOUS | Status: AC
  Administered 2020-02-10: 100 mg via INTRAVENOUS

## 2020-02-10 MED ORDER — ALBUTEROL SULFATE HFA 108 (90 BASE) MCG/ACT IN AERS: 2.0000 | INHALATION_SPRAY | Freq: Once | RESPIRATORY_TRACT | Status: DC | PRN

## 2020-02-10 MED ORDER — DIPHENHYDRAMINE HCL 50 MG/ML IJ SOLN: 50.0000 mg | Freq: Once | INTRAMUSCULAR | Status: DC | PRN

## 2020-02-10 NOTE — Progress Notes (Signed)
  Diagnosis: COVID-19  Physician: Dr. Joya Gaskins  Procedure: Covid Infusion Clinic Med: remdesivir infusion - Provided patient with remdesivir fact sheet for patients, parents and caregivers prior to infusion.  Complications: No immediate complications noted.  Discharge: Discharged home   Heidi Middleton 02/10/2020

## 2020-02-10 NOTE — Progress Notes (Signed)
Virtual Visit via Telephone Note   This visit type was conducted due to national recommendations for restrictions regarding the COVID-19 Pandemic (e.g. social distancing) in an effort to limit this patient's exposure and mitigate transmission in our community.  Due to her co-morbid illnesses, this patient is at least at moderate risk for complications without adequate follow up.  This format is felt to be most appropriate for this patient at this time.  The patient did not have access to video technology/had technical difficulties with video requiring transitioning to audio format only (telephone).  All issues noted in this document were discussed and addressed.  No physical exam could be performed with this format.  Please refer to the patient's chart for her  consent to telehealth for [facility]. Evaluation Performed:  Follow-up visit  This visit type was conducted due to national recommendations for restrictions regarding the COVID-19 Pandemic (e.g. social distancing).  This format is felt to be most appropriate for this patient at this time.  All issues noted in this document were discussed and addressed.  No physical exam was performed (except for noted visual exam findings with Video Visits).  Please refer to the patient's chart (MyChart message for video visits and phone note for telephone visits) for the patient's consent to telehealth for [facility]. _____________   Date:  02/10/2020   Patient ID:  LATIVIA VELIE, DOB 21-Feb-1946, MRN [id number] Patient Location:  Home Provider location:   Office  Primary Care Provider:  Alroy Dust, L.Marlou Sa, MD Primary Cardiologist:  Ida Rogue, MD  Chief Complaint    74 year old female with a history of aortic stenosis status post bioprosthetic aortic valve in May 2017, hypertension, hyperlipidemia, GERD, and recent diagnosis of COVID-19 status post admission earlier this week, who presents for virtual follow-up visit secondary to  hypertension.  Past Medical History    Past Medical History:  Diagnosis Date  . Adenomatous colon polyp 05/1985  . Allergy   . Aortic stenosis   . Cataract   . Cholelithiasis   . COVID-19 virus infection 01/2020  . Diverticulosis   . GERD (gastroesophageal reflux disease)   . Headache    migraines prior to hysterectomy  . Heart murmur   . History of prosthetic aortic valve    a. 07/2015 s/p bioprosthetic AVR; b. 01/2020 Echo: EF 65-70%, no rmwa, RVSP 28.53mmHg, 13mm pericardial tissue AoV - Vmax 2.48m/s, mean grad 60mmHg. triv AI.  Marland Kitchen Hyperlipidemia   . Hypertension   . Osteopenia   . PONV (postoperative nausea and vomiting)    nausea  . Shingles    Past Surgical History:  Procedure Laterality Date  . ABDOMINAL HYSTERECTOMY    . AORTIC VALVE REPLACEMENT N/A 08/16/2015   Procedure: AORTIC VALVE REPLACEMENT (AVR);  Surgeon: Ivin Poot, MD;  Location: [place];  Service: Open Heart Surgery;  Laterality: N/A;  . APPENDECTOMY     taken out with hysterectomy  . BREAST EXCISIONAL BIOPSY Right 1990   EXCISIONAL - NEG  . CARDIAC CATHETERIZATION Bilateral 08/04/2015   Procedure: Right/Left Heart Cath and Coronary Angiography;  Surgeon: Wellington Hampshire, MD;  Location: [place] CV LAB;  Service: Cardiovascular;  Laterality: Bilateral;  . CATARACT EXTRACTION, BILATERAL  2020  . ERCP N/A 10/31/2016   Procedure: ENDOSCOPIC RETROGRADE CHOLANGIOPANCREATOGRAPHY (ERCP);  Surgeon: Ladene Artist, MD;  Location: [facility] ENDOSCOPY;  Service: Endoscopy;  Laterality: N/A;  . EYE SURGERY N/A    detatched retina- "not sure which eye"  . KNEE SURGERY  10/2005   calcinosis - in office procedure  . TEE WITHOUT CARDIOVERSION N/A 08/16/2015   Procedure: TRANSESOPHAGEAL ECHOCARDIOGRAM (TEE);  Surgeon: Ivin Poot, MD;  Location: [place];  Service: Open Heart Surgery;  Laterality: N/A;    Allergies  Allergies  Allergen Reactions  . Codeine Nausea And Vomiting    History of Present Illness     MADDISYN HEGWOOD is a 74 y.o. female who presents via audio/video conferencing for a telehealth visit today.  As above, she has a h/o aortic stenosis status post bioprosthetic aortic valve replacement in May 2017, remote tobacco abuse, hypertension, hyperlipidemia, and GERD.  She was last seen in cardiology clinic in February, at which time she was doing well.  Unfortunately, she was diagnosed with COVID-19 on November 12 and subsequently developed nausea, vomiting, diarrhea, followed by syncope.  She was admitted to [facility] on November 17 and managed with supportive care for GI symptoms.  She did develop an worsening cough and chest x-ray showed pneumonia.  Echo was performed and showed normal LV function with stable gradients across the bioprosthetic aortic valve.  She was treated with monoclonal antibodies on admission followed by steroids and remdesivir (due for her last dose @ infusion center today).  Blood culture showed no growth.  She was subsequently discharged home November 22.  In the setting of dehydration on admission, diuretic therapy (hydrochlorothiazide reports this was discontinued.  She contacted our office on November 23 reporting increasing swelling in her lower extremities and blood pressures in the 170s.  She did take a dose of HCTZ last night and pressures are in the 140s this morning.  She still has fairly significant dyspnea on exertion but today is a little bit better than yesterday.  She has had some pleuritic chest pain in the setting of coughing but otherwise denies angina.  No palpitations, PND, orthopnea, dizziness, syncope, or early satiety (she notes significant hunger since discharge-on steroids).  The patient does have symptoms concerning for COVID-19 infection (dyspnea) in the setting of recent dx as outlined above.  Home Medications    Prior to Admission medications   Medication Sig Start Date End Date Taking? Authorizing Provider  acetaminophen (TYLENOL) 325 MG tablet  Take 650 mg by mouth every 6 (six) hours as needed for mild pain, moderate pain, fever or headache.   Yes [provider]  albuterol (VENTOLIN HFA) 108 (90 Base) MCG/ACT inhaler Inhale 2 puffs into the lungs every 6 (six) hours as needed for wheezing or shortness of breath. 02/08/20  Yes Thurnell Lose, MD  aspirin EC 81 MG tablet Take 1 tablet (81 mg total) by mouth daily. 11/03/18  Yes Gollan, Kathlene November, MD  atorvastatin (LIPITOR) 10 MG tablet TAKE 1/2 TABLET BY MOUTH EVERY DAY. NEEDS OFFICE VISIT Patient taking differently: Take 5 mg by mouth daily. TAKE 1/2 TABLET BY MOUTH EVERY DAY. NEEDS OFFICE VISIT 01/16/20  Yes Tower, Wynelle Fanny, MD  Calcium Polycarbophil (FIBER LAXATIVE PO) Take 2 tablets by mouth 2 (two) times a day.   Yes [provider]  cholecalciferol (VITAMIN D) 1000 UNITS tablet Take 1,000 Units by mouth 2 (two) times daily.    Yes [provider]  esomeprazole (NEXIUM) 40 MG capsule TAKE 1 CAPSULE BY MOUTH EVERY DAY BEFORE BREAKFAST Patient taking differently: Take 40 mg by mouth daily before breakfast. TAKE 1 CAPSULE BY MOUTH EVERY DAY BEFORE BREAKFAST 12/02/18  Yes Tower, Wynelle Fanny, MD  fluticasone (FLONASE) 50 MCG/ACT nasal spray Place  1-2 sprays into both nostrils daily as needed for rhinitis.   Yes [provider]  hydrochlorothiazide (HYDRODIURIL) 25 MG tablet Take 1 tablet (25 mg total) by mouth 3 (three) times a week. 02/27/19 02/10/20 Yes Gollan, Kathlene November, MD  lisinopril (ZESTRIL) 20 MG tablet TAKE 1 TABLET BY MOUTH EVERY DAY Patient taking differently: Take 20 mg by mouth daily.  10/20/19  Yes Minna Merritts, MD  methylPREDNISolone (MEDROL DOSEPAK) 4 MG TBPK tablet follow package directions 02/08/20  Yes Thurnell Lose, MD  metoprolol tartrate (LOPRESSOR) 25 MG tablet TAKE 1 TABLET BY MOUTH TWICE A DAY Patient taking differently: Take 25 mg by mouth 2 (two) times daily.  10/28/19  Yes Gollan, Kathlene November, MD  Multiple Vitamin (MULTIVITAMIN  WITH MINERALS) TABS tablet Take 1 tablet by mouth daily.   Yes [provider]  omega-3 acid ethyl esters (LOVAZA) 1 g capsule Take 1 g by mouth 2 (two) times daily.   Yes [provider]  potassium chloride (KLOR-CON) 10 MEQ tablet Take 1 tablet (10 mEq total) by mouth 3 (three) times a week. Please call office to schedule appointment for February. 02/01/20 05/01/20 Yes Minna Merritts, MD    Review of Systems    Dyspnea on exertion with recent nausea, vomiting, diarrhea, and cough in the setting of COVID-19 infection requiring hospitalization.  She has had some pleuritic chest discomfort.  Since discharge, increase in appetite.  Also an increase in swelling off of HCTZ.  She denies palpitations, PND, orthopnea, dizziness, syncope, or early satiety.  All other systems reviewed and are otherwise negative except as noted above.  Physical Exam    Vital Signs:  BP (!) 145/72   Ht 5' 7.5" (1.715 m)   Wt 170 lb (77.1 kg) Comment: 165 LBS- 170LBS pt not sure.  BMI 26.23 kg/m    Phone visit-no acute distress.  Speech is unlabored.  She is awake alert and oriented x3.  Accessory Clinical Findings    2D Echocardiogram 11.19.2021 -report reviewed and discussed with patient today   1. Left ventricular ejection fraction, by estimation, is 65 to 70%. The  left ventricle has normal function. The left ventricle has no regional  wall motion abnormalities. There is mild concentric left ventricular  hypertrophy. Left ventricular diastolic  function could not be evaluated.   2. Right ventricular systolic function is normal. The right ventricular  size is normal. There is normal pulmonary artery systolic pressure. The  estimated right ventricular systolic pressure is 40.9 mmHg.   3. The mitral valve is grossly normal. No evidence of mitral valve  regurgitation. No evidence of mitral stenosis.   4. 21 mm pericardial tissue aortic valve. Vmax 2.3 m/s, mean gradient 12  mmHG, EOA 2.15  cm2, DI 0.62. Trivial regurgitation. The aortic valve has  been repaired/replaced. Aortic valve regurgitation is trivial. There is a  21 mm Edwards pericardial valve  present in the aortic position. Procedure Date: 08/17/2015.   5. The inferior vena cava is normal in size with greater than 50%  respiratory variability, suggesting right atrial pressure of 3 mmHg.  _____________   Assessment & Plan    1.  Essential hypertension: Patient recently admitted with [id number] complicated by cough, dyspnea, pneumonia, nausea, vomiting, and diarrhea.  HCTZ was held during admission.  She was discharged home the 22nd and has noted elevated blood pressures and also some mild swelling of her extremities.  She is currently on a steroid taper which might be driving  some of this.  She took a dose of HCTZ last night and pressures are in the 140s this morning.  She is accustomed to having a normal blood pressure.  She is also on lisinopril metoprolol.  I advised that during steroid taper, she may require HCTZ 25 mg daily and then following taper, she should return to 1 tablet 3 times a week.  She sees primary care next week and should have a basic metabolic panel to reevaluate renal function and potassium.  2.  COVID-19 infection: Status post hospitalization with slow but steady improvement in dyspnea.  She is currently on steroids and is due to receive her fifth and last dose of remdesivir today.  3.  Status post bioprosthetic aortic valve replacement: Echocardiogram performed during hospitalization showed normal valve function and normal LV function.  Discussed with patient today.  4.  Disposition: Recommend follow-up basic metabolic panel next week.  She plans to have this performed through her primary care provider's office.  Follow-up with Dr. Rockey Situ in 6 months or sooner if necessary.  COVID-19 Education: The signs and symptoms of COVID-19 were discussed with the patient and how to seek care for testing (follow  up with PCP or arrange E-visit).  The importance of social distancing was discussed today.  Patient Risk:   After full review of this patient's history and clinical status, I feel that he is at least moderate risk for cardiac complications at this time, thus necessitating a telehealth visit sooner than our first available in office visit.  Time:   Today, I have spent 15 minutes with the patient with telehealth technology discussing medical history, symptoms, and management plan.     Murray Hodgkins, NP 02/10/2020, 8:59 AM

## 2020-02-10 NOTE — Patient Instructions (Signed)
Medication Instructions:  Your physician recommends that you continue on your current medications as directed. Please refer to the Current Medication list given to you today.  *If you need a refill on your cardiac medications before your next appointment, please call your pharmacy*   Lab Work: None If you have labs (blood work) drawn today and your tests are completely normal, you will receive your results only by: Marland Kitchen MyChart Message (if you have MyChart) OR . A paper copy in the mail If you have any lab test that is abnormal or we need to change your treatment, we will call you to review the results.   Testing/Procedures: None   Follow-Up: At [facility], you and your health needs are our priority.  As part of our continuing mission to provide you with exceptional heart care, we have created designated Provider Care Teams.  These Care Teams include your primary Cardiologist (physician) and Advanced Practice Providers (APPs -  Physician Assistants and Nurse Practitioners) who all work together to provide you with the care you need, when you need it.  We recommend signing up for the patient portal called "MyChart".  Sign up information is provided on this After Visit Summary.  MyChart is used to connect with patients for Virtual Visits (Telemedicine).  Patients are able to view lab/test results, encounter notes, upcoming appointments, etc.  Non-urgent messages can be sent to your provider as well.   To learn more about what you can do with MyChart, go to [email or web address].    Your next appointment:   6 month(s)  The format for your next appointment:   In Person  Provider:   Ida Rogue, MD   Other Instructions N/A

## 2020-02-11 ENCOUNTER — Other Ambulatory Visit: Payer: Self-pay | Admitting: Family Medicine

## 2020-02-15 ENCOUNTER — Other Ambulatory Visit: Payer: Self-pay

## 2020-02-15 DIAGNOSIS — K219 Gastro-esophageal reflux disease without esophagitis: Secondary | ICD-10-CM | POA: Diagnosis not present

## 2020-02-15 DIAGNOSIS — U071 COVID-19: Secondary | ICD-10-CM | POA: Diagnosis not present

## 2020-02-15 NOTE — Patient Outreach (Signed)
[place] [facility]) Care Management  02/15/2020  GEMMA RUAN 07/15/1945 [id number]   EMMI- General Discharge RED ON EMMI ALERT Day # 4 Date: 02/13/20 Red Alert Reason:  Lost interest in things?   Yes  Other questions/problems?   Yes    Outreach attempt: spoke with patient.  She reports doing better. Discussed red alerts. Patient reports that system would not recognize her answers.  She has no questions or concerns.  Patient has follow up with PCP on tomorrow and her daughter will be transporting.      Plan: RN CM will close case.    Jone Baseman, RN, MSN [facility] Care Management Care Management Coordinator Direct Line [phone] Toll Free: [phone]  Fax: [phone]

## 2020-02-18 ENCOUNTER — Other Ambulatory Visit: Payer: Self-pay | Admitting: Cardiovascular Disease

## 2020-03-15 DIAGNOSIS — M62830 Muscle spasm of back: Secondary | ICD-10-CM | POA: Diagnosis not present

## 2020-03-15 DIAGNOSIS — M5033 Other cervical disc degeneration, cervicothoracic region: Secondary | ICD-10-CM | POA: Diagnosis not present

## 2020-03-15 DIAGNOSIS — M9902 Segmental and somatic dysfunction of thoracic region: Secondary | ICD-10-CM | POA: Diagnosis not present

## 2020-03-15 DIAGNOSIS — M9901 Segmental and somatic dysfunction of cervical region: Secondary | ICD-10-CM | POA: Diagnosis not present

## 2020-04-12 DIAGNOSIS — M62830 Muscle spasm of back: Secondary | ICD-10-CM | POA: Diagnosis not present

## 2020-04-12 DIAGNOSIS — M9902 Segmental and somatic dysfunction of thoracic region: Secondary | ICD-10-CM | POA: Diagnosis not present

## 2020-04-12 DIAGNOSIS — M5033 Other cervical disc degeneration, cervicothoracic region: Secondary | ICD-10-CM | POA: Diagnosis not present

## 2020-04-12 DIAGNOSIS — M9901 Segmental and somatic dysfunction of cervical region: Secondary | ICD-10-CM | POA: Diagnosis not present

## 2020-04-14 ENCOUNTER — Other Ambulatory Visit: Payer: Self-pay | Admitting: Family Medicine

## 2020-04-14 NOTE — Telephone Encounter (Signed)
Please schedule PE and refill until then  

## 2020-04-14 NOTE — Telephone Encounter (Signed)
Pt hasn't been seen in over and no future appts., please advise

## 2020-04-15 NOTE — Telephone Encounter (Signed)
Med refilled once and Heidi Middleton will reach out to pt to try and get appt scheduled  

## 2020-04-21 ENCOUNTER — Other Ambulatory Visit: Payer: Self-pay | Admitting: Cardiovascular Disease

## 2020-04-23 ENCOUNTER — Other Ambulatory Visit: Payer: Self-pay | Admitting: Cardiovascular Disease

## 2020-04-25 ENCOUNTER — Other Ambulatory Visit: Payer: Self-pay | Admitting: Family Medicine

## 2020-04-30 NOTE — Progress Notes (Signed)
[facility] cardiology Office Note  Date:  05/02/2020   ID:  Heidi Middleton, DOB [birth year], MRN [id number]  PCP:  Heidi Middleton.Heidi Sa, MD   Chief Complaint  Patient presents with  . Follow-up    12 month F/U, AVR    HPI:  Heidi Middleton is a 75 y.o. female with PMH of s/p AVR 08/16/2015 bovine previous smoker Does exercise 4 days a week  presents for follow-up of her aortic valve stenosis, s/p AVR, HTN  01/2020 COVID November 2021 Treated with steroids, remdesivir Nausea vomiting diarrhea, syncope felt to be vasovagal Cough respiratory distress  No significant chest pain, no CHF symptoms, no PND orthopnea leg swelling  Blood pressure elevated at home, stress makes it runs higher  Echo 01/2020, reviewed on today's visit 1. Left ventricular ejection fraction, by estimation, is 65 to 70%. The  left ventricle has normal function. The left ventricle has no regional  wall motion abnormalities. There is mild concentric left ventricular  hypertrophy. Left ventricular diastolic  function could not be evaluated.  2. Right ventricular systolic function is normal. 3.  21 mm pericardial tissue aortic valve. Vmax 2.3 m/s, mean gradient 12  mmHG, EOA 2.15 cm2, DI 0.62. Trivial regurgitation. The aortic valve has  been repaired/replaced. Aortic valve regurgitation is trivial. There is a  21 mm Edwards pericardial valve   Echo 11/2018  Echo prior to that 2018  EKG personally reviewed by myself on todays visit Shows normal sinus rhythm rate 86 bpm no significant ST or T-wave changes   Other past medical history reviewed [facility] 08/04/2015:  Prox RCA to Mid RCA lesion, 30% stenosed.  1. Mild nonobstructive coronary artery disease. 2. Severe aortic valve stenosis with a mean gradient of 51.6 mmHg with a valve area of 0.67. 3. Right heart catheterization showed normal pulmonary pressure, normal cardiac output and mildly elevated pulmonary wedge pressure.  Echo 2020, stable prosthetic valve      PMH:   has a past medical history of Adenomatous colon polyp (05/1985), Allergy, Aortic stenosis, Cataract, Cholelithiasis, COVID-19 virus infection (01/2020), Diverticulosis, GERD (gastroesophageal reflux disease), Headache, Heart murmur, History of prosthetic aortic valve, Hyperlipidemia, Hypertension, Osteopenia, PONV (postoperative nausea and vomiting), and Shingles.  PSH:    Past Surgical History:  Procedure Laterality Date  . ABDOMINAL HYSTERECTOMY    . AORTIC VALVE REPLACEMENT N/A 08/16/2015   Procedure: AORTIC VALVE REPLACEMENT (AVR);  Surgeon: Ivin Poot, MD;  Location: [place];  Service: Open Heart Surgery;  Laterality: N/A;  . APPENDECTOMY     taken out with hysterectomy  . BREAST EXCISIONAL BIOPSY Right 1990   EXCISIONAL - NEG  . CARDIAC CATHETERIZATION Bilateral 08/04/2015   Procedure: Right/Left Heart Cath and Coronary Angiography;  Surgeon: Wellington Hampshire, MD;  Location: [place] CV LAB;  Service: Cardiovascular;  Laterality: Bilateral;  . CATARACT EXTRACTION, BILATERAL  2020  . ERCP N/A 10/31/2016   Procedure: ENDOSCOPIC RETROGRADE CHOLANGIOPANCREATOGRAPHY (ERCP);  Surgeon: Ladene Artist, MD;  Location: [facility] ENDOSCOPY;  Service: Endoscopy;  Laterality: N/A;  . EYE SURGERY N/A    detatched retina- "not sure which eye"  . KNEE SURGERY  10/2005   calcinosis - in office procedure  . TEE WITHOUT CARDIOVERSION N/A 08/16/2015   Procedure: TRANSESOPHAGEAL ECHOCARDIOGRAM (TEE);  Surgeon: Ivin Poot, MD;  Location: [place];  Service: Open Heart Surgery;  Laterality: N/A;    Current Outpatient Medications  Medication Sig Dispense Refill  . acetaminophen (TYLENOL) 325 MG tablet Take 650 mg by  mouth every 6 (six) hours as needed for mild pain, moderate pain, fever or headache.    . albuterol (VENTOLIN HFA) 108 (90 Base) MCG/ACT inhaler Inhale 2 puffs into the lungs every 6 (six) hours as needed for wheezing or shortness of breath. 6.7 g 0  . aspirin EC 81 MG tablet  Take 1 tablet (81 mg total) by mouth daily. 90 tablet 3  . atorvastatin (LIPITOR) 10 MG tablet TAKE 1/2 TABLET BY MOUTH EVERY DAY. NEEDS OFFICE VISIT 15 tablet 0  . cholecalciferol (VITAMIN D) 1000 UNITS tablet Take 1,000 Units by mouth 2 (two) times daily.     Marland Kitchen esomeprazole (NEXIUM) 40 MG capsule TAKE 1 CAPSULE BY MOUTH EVERY DAY BEFORE BREAKFAST 90 capsule 3  . fluticasone (FLONASE) 50 MCG/ACT nasal spray Place 1-2 sprays into both nostrils daily as needed for rhinitis.    . hydrochlorothiazide (HYDRODIURIL) 25 MG tablet TAKE 1 TABLET (25 MG TOTAL) BY MOUTH 3 (THREE) TIMES A WEEK. 36 tablet 1  . lisinopril (ZESTRIL) 20 MG tablet TAKE 1 TABLET BY MOUTH EVERY DAY 90 tablet 3  . metoprolol tartrate (LOPRESSOR) 25 MG tablet TAKE 1 TABLET BY MOUTH TWICE A DAY 180 tablet 1  . Multiple Vitamin (MULTIVITAMIN WITH MINERALS) TABS tablet Take 1 tablet by mouth daily.    Marland Kitchen omega-3 acid ethyl esters (LOVAZA) 1 g capsule Take 1 g by mouth 2 (two) times daily.    . potassium chloride (KLOR-CON) 10 MEQ tablet Take 1 tablet (10 mEq total) by mouth 3 (three) times a week. 36 tablet 0   No current facility-administered medications for this visit.    Allergies:   Codeine   Social History:  The patient  reports that she quit smoking about 17 years ago. She quit after 25.00 years of use. She has never used smokeless tobacco. She reports that she does not drink alcohol and does not use drugs.   Family History:   family history includes Cancer in her brother, father, and mother; Diabetes in her maternal grandmother; Heart disease in her sister; Hypertension in her brother and brother; Lung cancer in her brother; Obesity in her sister; Pancreatic cancer in her father; Stroke in her mother.    Review of Systems: Review of Systems  Constitutional: Negative.   Respiratory: Negative.   Cardiovascular: Negative.   Gastrointestinal: Negative.   Musculoskeletal: Negative.   Neurological: Negative.    Psychiatric/Behavioral: Negative.   All other systems reviewed and are negative.   PHYSICAL EXAM: VS:  BP 140/90 (BP Location: Left Arm, Patient Position: Sitting, Cuff Size: Normal)   Pulse 86   Ht 5' 7.75" (1.721 m)   Wt 165 lb (74.8 kg)   SpO2 96%   BMI 25.27 kg/m  , BMI Body mass index is 25.27 kg/m. Constitutional:  oriented to person, place, and time. No distress.  HENT:  Head: Grossly normal Eyes:  no discharge. No scleral icterus.  Neck: No JVD, no carotid bruits  Cardiovascular: Regular rate and rhythm, no murmurs appreciated Pulmonary/Chest: Clear to auscultation bilaterally, no wheezes or rails Abdominal: Soft.  no distension.  no tenderness.  Musculoskeletal: Normal range of motion Neurological:  normal muscle tone. Coordination normal. No atrophy Skin: Skin warm and dry Psychiatric: normal affect, pleasant   Recent Labs: 02/08/2020: ALT 38; BUN 24; Creatinine, Ser 0.98; Hemoglobin 10.9; Magnesium 2.0; Platelets 297; Potassium 4.0; Sodium 135    Lipid Panel Lab Results  Component Value Date   CHOL 174 11/26/2018   HDL 71.30  11/26/2018   LDLCALC 84 11/26/2018   TRIG 50 02/03/2020      Wt Readings from Last 3 Encounters:  05/02/20 165 lb (74.8 kg)  02/10/20 170 lb (77.1 kg)  02/03/20 164 lb 14.4 oz (74.8 kg)     ASSESSMENT AND PLAN:  Nonrheumatic aortic valve stenosis -  echo in 2018, 2020, 2021 Stable Numbers revierwed  Essential hypertension Blood pressure is well controlled at home, No changes made to the medications.   HYPERCHOLESTEROLEMIA  on lipitor Numbers good  Preventive care Walking program, watch diet    Total encounter time more than 25 minutes  Greater than 50% was spent in counseling and coordination of care with the patient    Orders Placed This Encounter  Procedures  . EKG 12-Lead     Signed, Esmond Plants, M.D., Ph.D. 05/02/2020  [place], [place]

## 2020-05-02 ENCOUNTER — Ambulatory Visit (INDEPENDENT_AMBULATORY_CARE_PROVIDER_SITE_OTHER): Payer: Medicare PPO | Admitting: Cardiovascular Disease

## 2020-05-02 ENCOUNTER — Encounter: Payer: Self-pay | Admitting: Cardiovascular Disease

## 2020-05-02 ENCOUNTER — Other Ambulatory Visit: Payer: Self-pay

## 2020-05-02 VITALS — BP 140/90 | HR 86 | Ht 67.75 in | Wt 165.0 lb

## 2020-05-02 DIAGNOSIS — I350 Nonrheumatic aortic (valve) stenosis: Secondary | ICD-10-CM | POA: Diagnosis not present

## 2020-05-02 DIAGNOSIS — Z953 Presence of xenogenic heart valve: Secondary | ICD-10-CM

## 2020-05-02 DIAGNOSIS — I10 Essential (primary) hypertension: Secondary | ICD-10-CM

## 2020-05-02 DIAGNOSIS — E7800 Pure hypercholesterolemia, unspecified: Secondary | ICD-10-CM

## 2020-05-02 NOTE — Patient Instructions (Signed)
Medication Instructions:  No changes  If you need a refill on your cardiac medications before your next appointment, please call your pharmacy.    Lab work: No new labs needed   If you have labs (blood work) drawn today and your tests are completely normal, you will receive your results only by: . MyChart Message (if you have MyChart) OR . A paper copy in the mail If you have any lab test that is abnormal or we need to change your treatment, we will call you to review the results.   Testing/Procedures: No new testing needed   Follow-Up: At [facility], you and your health needs are our priority.  As part of our continuing mission to provide you with exceptional heart care, we have created designated Provider Care Teams.  These Care Teams include your primary Cardiologist (physician) and Advanced Practice Providers (APPs -  Physician Assistants and Nurse Practitioners) who all work together to provide you with the care you need, when you need it.  . You will need a follow up appointment in 12 months  . Providers on your designated Care Team:   . Christopher Berge, NP . Ryan Dunn, PA-C . Jacquelyn Visser, PA-C  Any Other Special Instructions Will Be Listed Below (If Applicable).  COVID-19 Vaccine Information can be found at: [email or web address] For questions related to vaccine distribution or appointments, please email [email or web address] or call [phone].     

## 2020-05-09 DIAGNOSIS — M62830 Muscle spasm of back: Secondary | ICD-10-CM | POA: Diagnosis not present

## 2020-05-09 DIAGNOSIS — M9905 Segmental and somatic dysfunction of pelvic region: Secondary | ICD-10-CM | POA: Diagnosis not present

## 2020-05-09 DIAGNOSIS — M9903 Segmental and somatic dysfunction of lumbar region: Secondary | ICD-10-CM | POA: Diagnosis not present

## 2020-05-09 DIAGNOSIS — M9902 Segmental and somatic dysfunction of thoracic region: Secondary | ICD-10-CM | POA: Diagnosis not present

## 2020-05-10 DIAGNOSIS — K219 Gastro-esophageal reflux disease without esophagitis: Secondary | ICD-10-CM | POA: Diagnosis not present

## 2020-05-10 DIAGNOSIS — I10 Essential (primary) hypertension: Secondary | ICD-10-CM | POA: Diagnosis not present

## 2020-05-10 DIAGNOSIS — E7800 Pure hypercholesterolemia, unspecified: Secondary | ICD-10-CM | POA: Diagnosis not present

## 2020-05-17 DIAGNOSIS — M9905 Segmental and somatic dysfunction of pelvic region: Secondary | ICD-10-CM | POA: Diagnosis not present

## 2020-05-17 DIAGNOSIS — M9902 Segmental and somatic dysfunction of thoracic region: Secondary | ICD-10-CM | POA: Diagnosis not present

## 2020-05-17 DIAGNOSIS — M62830 Muscle spasm of back: Secondary | ICD-10-CM | POA: Diagnosis not present

## 2020-05-17 DIAGNOSIS — M9903 Segmental and somatic dysfunction of lumbar region: Secondary | ICD-10-CM | POA: Diagnosis not present

## 2020-06-13 DIAGNOSIS — M9905 Segmental and somatic dysfunction of pelvic region: Secondary | ICD-10-CM | POA: Diagnosis not present

## 2020-06-13 DIAGNOSIS — M62830 Muscle spasm of back: Secondary | ICD-10-CM | POA: Diagnosis not present

## 2020-06-13 DIAGNOSIS — M9902 Segmental and somatic dysfunction of thoracic region: Secondary | ICD-10-CM | POA: Diagnosis not present

## 2020-06-13 DIAGNOSIS — M9903 Segmental and somatic dysfunction of lumbar region: Secondary | ICD-10-CM | POA: Diagnosis not present

## 2020-07-07 ENCOUNTER — Other Ambulatory Visit: Payer: Self-pay | Admitting: Family Medicine

## 2020-07-07 DIAGNOSIS — Z1231 Encounter for screening mammogram for malignant neoplasm of breast: Secondary | ICD-10-CM

## 2020-07-12 DIAGNOSIS — M9903 Segmental and somatic dysfunction of lumbar region: Secondary | ICD-10-CM | POA: Diagnosis not present

## 2020-07-12 DIAGNOSIS — M9905 Segmental and somatic dysfunction of pelvic region: Secondary | ICD-10-CM | POA: Diagnosis not present

## 2020-07-12 DIAGNOSIS — M62830 Muscle spasm of back: Secondary | ICD-10-CM | POA: Diagnosis not present

## 2020-07-12 DIAGNOSIS — M9902 Segmental and somatic dysfunction of thoracic region: Secondary | ICD-10-CM | POA: Diagnosis not present

## 2020-07-20 ENCOUNTER — Other Ambulatory Visit: Payer: Self-pay | Admitting: Cardiovascular Disease

## 2020-08-09 DIAGNOSIS — M9902 Segmental and somatic dysfunction of thoracic region: Secondary | ICD-10-CM | POA: Diagnosis not present

## 2020-08-09 DIAGNOSIS — M9903 Segmental and somatic dysfunction of lumbar region: Secondary | ICD-10-CM | POA: Diagnosis not present

## 2020-08-09 DIAGNOSIS — M62830 Muscle spasm of back: Secondary | ICD-10-CM | POA: Diagnosis not present

## 2020-08-09 DIAGNOSIS — M9905 Segmental and somatic dysfunction of pelvic region: Secondary | ICD-10-CM | POA: Diagnosis not present

## 2020-08-23 ENCOUNTER — Other Ambulatory Visit: Payer: Self-pay

## 2020-08-23 ENCOUNTER — Ambulatory Visit
Admission: RE | Admit: 2020-08-23 | Discharge: 2020-08-23 | Disposition: A | Discharge status: Home / Self Care | Payer: Medicare PPO | Source: Ambulatory Visit | Attending: Family Medicine | Admitting: Family Medicine

## 2020-08-23 DIAGNOSIS — Z1231 Encounter for screening mammogram for malignant neoplasm of breast: Secondary | ICD-10-CM | POA: Diagnosis not present

## 2020-09-07 DIAGNOSIS — M9905 Segmental and somatic dysfunction of pelvic region: Secondary | ICD-10-CM | POA: Diagnosis not present

## 2020-09-07 DIAGNOSIS — M62830 Muscle spasm of back: Secondary | ICD-10-CM | POA: Diagnosis not present

## 2020-09-07 DIAGNOSIS — M9902 Segmental and somatic dysfunction of thoracic region: Secondary | ICD-10-CM | POA: Diagnosis not present

## 2020-09-07 DIAGNOSIS — M9903 Segmental and somatic dysfunction of lumbar region: Secondary | ICD-10-CM | POA: Diagnosis not present

## 2020-10-04 DIAGNOSIS — M9905 Segmental and somatic dysfunction of pelvic region: Secondary | ICD-10-CM | POA: Diagnosis not present

## 2020-10-04 DIAGNOSIS — M62830 Muscle spasm of back: Secondary | ICD-10-CM | POA: Diagnosis not present

## 2020-10-04 DIAGNOSIS — M9902 Segmental and somatic dysfunction of thoracic region: Secondary | ICD-10-CM | POA: Diagnosis not present

## 2020-10-04 DIAGNOSIS — M9903 Segmental and somatic dysfunction of lumbar region: Secondary | ICD-10-CM | POA: Diagnosis not present

## 2020-10-12 ENCOUNTER — Other Ambulatory Visit: Payer: Self-pay | Admitting: Cardiovascular Disease

## 2020-10-25 ENCOUNTER — Other Ambulatory Visit: Payer: Self-pay | Admitting: Cardiovascular Disease

## 2020-11-01 DIAGNOSIS — M9902 Segmental and somatic dysfunction of thoracic region: Secondary | ICD-10-CM | POA: Diagnosis not present

## 2020-11-01 DIAGNOSIS — M9905 Segmental and somatic dysfunction of pelvic region: Secondary | ICD-10-CM | POA: Diagnosis not present

## 2020-11-01 DIAGNOSIS — M9903 Segmental and somatic dysfunction of lumbar region: Secondary | ICD-10-CM | POA: Diagnosis not present

## 2020-11-01 DIAGNOSIS — M62830 Muscle spasm of back: Secondary | ICD-10-CM | POA: Diagnosis not present

## 2020-11-22 DIAGNOSIS — Z0000 Encounter for general adult medical examination without abnormal findings: Secondary | ICD-10-CM | POA: Diagnosis not present

## 2020-11-22 DIAGNOSIS — K219 Gastro-esophageal reflux disease without esophagitis: Secondary | ICD-10-CM | POA: Diagnosis not present

## 2020-11-22 DIAGNOSIS — I10 Essential (primary) hypertension: Secondary | ICD-10-CM | POA: Diagnosis not present

## 2020-11-22 DIAGNOSIS — E2839 Other primary ovarian failure: Secondary | ICD-10-CM | POA: Diagnosis not present

## 2020-11-22 DIAGNOSIS — F329 Major depressive disorder, single episode, unspecified: Secondary | ICD-10-CM | POA: Diagnosis not present

## 2020-11-22 DIAGNOSIS — Z23 Encounter for immunization: Secondary | ICD-10-CM | POA: Diagnosis not present

## 2020-11-22 DIAGNOSIS — E7800 Pure hypercholesterolemia, unspecified: Secondary | ICD-10-CM | POA: Diagnosis not present

## 2020-11-23 ENCOUNTER — Other Ambulatory Visit: Payer: Self-pay | Admitting: Family Medicine

## 2020-11-23 DIAGNOSIS — E2839 Other primary ovarian failure: Secondary | ICD-10-CM

## 2020-12-06 DIAGNOSIS — M62830 Muscle spasm of back: Secondary | ICD-10-CM | POA: Diagnosis not present

## 2020-12-06 DIAGNOSIS — M9905 Segmental and somatic dysfunction of pelvic region: Secondary | ICD-10-CM | POA: Diagnosis not present

## 2020-12-06 DIAGNOSIS — M9903 Segmental and somatic dysfunction of lumbar region: Secondary | ICD-10-CM | POA: Diagnosis not present

## 2020-12-06 DIAGNOSIS — M9902 Segmental and somatic dysfunction of thoracic region: Secondary | ICD-10-CM | POA: Diagnosis not present

## 2020-12-12 DIAGNOSIS — Z809 Family history of malignant neoplasm, unspecified: Secondary | ICD-10-CM | POA: Diagnosis not present

## 2020-12-12 DIAGNOSIS — Z7982 Long term (current) use of aspirin: Secondary | ICD-10-CM | POA: Diagnosis not present

## 2020-12-12 DIAGNOSIS — I2510 Atherosclerotic heart disease of native coronary artery without angina pectoris: Secondary | ICD-10-CM | POA: Diagnosis not present

## 2020-12-12 DIAGNOSIS — I10 Essential (primary) hypertension: Secondary | ICD-10-CM | POA: Diagnosis not present

## 2020-12-12 DIAGNOSIS — R32 Unspecified urinary incontinence: Secondary | ICD-10-CM | POA: Diagnosis not present

## 2020-12-12 DIAGNOSIS — K219 Gastro-esophageal reflux disease without esophagitis: Secondary | ICD-10-CM | POA: Diagnosis not present

## 2020-12-12 DIAGNOSIS — F325 Major depressive disorder, single episode, in full remission: Secondary | ICD-10-CM | POA: Diagnosis not present

## 2020-12-12 DIAGNOSIS — E785 Hyperlipidemia, unspecified: Secondary | ICD-10-CM | POA: Diagnosis not present

## 2020-12-12 DIAGNOSIS — Z8249 Family history of ischemic heart disease and other diseases of the circulatory system: Secondary | ICD-10-CM | POA: Diagnosis not present

## 2020-12-22 ENCOUNTER — Other Ambulatory Visit: Payer: Self-pay

## 2020-12-22 ENCOUNTER — Ambulatory Visit
Admission: RE | Admit: 2020-12-22 | Discharge: 2020-12-22 | Disposition: A | Discharge status: Home / Self Care | Payer: Medicare PPO | Source: Ambulatory Visit | Attending: Family Medicine | Admitting: Family Medicine

## 2020-12-22 DIAGNOSIS — E2839 Other primary ovarian failure: Secondary | ICD-10-CM | POA: Diagnosis not present

## 2020-12-22 DIAGNOSIS — Z780 Asymptomatic menopausal state: Secondary | ICD-10-CM | POA: Diagnosis not present

## 2020-12-22 DIAGNOSIS — M85852 Other specified disorders of bone density and structure, left thigh: Secondary | ICD-10-CM | POA: Diagnosis not present

## 2020-12-27 DIAGNOSIS — M9905 Segmental and somatic dysfunction of pelvic region: Secondary | ICD-10-CM | POA: Diagnosis not present

## 2020-12-27 DIAGNOSIS — M9903 Segmental and somatic dysfunction of lumbar region: Secondary | ICD-10-CM | POA: Diagnosis not present

## 2020-12-27 DIAGNOSIS — M9902 Segmental and somatic dysfunction of thoracic region: Secondary | ICD-10-CM | POA: Diagnosis not present

## 2020-12-27 DIAGNOSIS — M62830 Muscle spasm of back: Secondary | ICD-10-CM | POA: Diagnosis not present

## 2021-01-06 ENCOUNTER — Other Ambulatory Visit: Payer: Self-pay | Admitting: Cardiovascular Disease

## 2021-01-24 DIAGNOSIS — M9905 Segmental and somatic dysfunction of pelvic region: Secondary | ICD-10-CM | POA: Diagnosis not present

## 2021-01-24 DIAGNOSIS — M9902 Segmental and somatic dysfunction of thoracic region: Secondary | ICD-10-CM | POA: Diagnosis not present

## 2021-01-24 DIAGNOSIS — M62830 Muscle spasm of back: Secondary | ICD-10-CM | POA: Diagnosis not present

## 2021-01-24 DIAGNOSIS — M9903 Segmental and somatic dysfunction of lumbar region: Secondary | ICD-10-CM | POA: Diagnosis not present

## 2021-02-21 DIAGNOSIS — M9903 Segmental and somatic dysfunction of lumbar region: Secondary | ICD-10-CM | POA: Diagnosis not present

## 2021-02-21 DIAGNOSIS — M9902 Segmental and somatic dysfunction of thoracic region: Secondary | ICD-10-CM | POA: Diagnosis not present

## 2021-02-21 DIAGNOSIS — M62830 Muscle spasm of back: Secondary | ICD-10-CM | POA: Diagnosis not present

## 2021-02-21 DIAGNOSIS — M9905 Segmental and somatic dysfunction of pelvic region: Secondary | ICD-10-CM | POA: Diagnosis not present

## 2021-03-14 DIAGNOSIS — M9902 Segmental and somatic dysfunction of thoracic region: Secondary | ICD-10-CM | POA: Diagnosis not present

## 2021-03-14 DIAGNOSIS — M9903 Segmental and somatic dysfunction of lumbar region: Secondary | ICD-10-CM | POA: Diagnosis not present

## 2021-03-14 DIAGNOSIS — M62830 Muscle spasm of back: Secondary | ICD-10-CM | POA: Diagnosis not present

## 2021-03-14 DIAGNOSIS — M9905 Segmental and somatic dysfunction of pelvic region: Secondary | ICD-10-CM | POA: Diagnosis not present

## 2021-03-28 DIAGNOSIS — M9903 Segmental and somatic dysfunction of lumbar region: Secondary | ICD-10-CM | POA: Diagnosis not present

## 2021-03-28 DIAGNOSIS — M62830 Muscle spasm of back: Secondary | ICD-10-CM | POA: Diagnosis not present

## 2021-03-28 DIAGNOSIS — M9905 Segmental and somatic dysfunction of pelvic region: Secondary | ICD-10-CM | POA: Diagnosis not present

## 2021-03-28 DIAGNOSIS — M9902 Segmental and somatic dysfunction of thoracic region: Secondary | ICD-10-CM | POA: Diagnosis not present

## 2021-04-06 ENCOUNTER — Other Ambulatory Visit: Payer: Self-pay | Admitting: Cardiovascular Disease

## 2021-04-06 NOTE — Telephone Encounter (Signed)
Please schedule F/U appointment. Thank you! 

## 2021-04-06 NOTE — Telephone Encounter (Signed)
Attempted to schedule.  

## 2021-04-06 NOTE — Telephone Encounter (Signed)
Scheduled

## 2021-04-25 DIAGNOSIS — M62830 Muscle spasm of back: Secondary | ICD-10-CM | POA: Diagnosis not present

## 2021-04-25 DIAGNOSIS — M9902 Segmental and somatic dysfunction of thoracic region: Secondary | ICD-10-CM | POA: Diagnosis not present

## 2021-04-25 DIAGNOSIS — M9905 Segmental and somatic dysfunction of pelvic region: Secondary | ICD-10-CM | POA: Diagnosis not present

## 2021-04-25 DIAGNOSIS — M9903 Segmental and somatic dysfunction of lumbar region: Secondary | ICD-10-CM | POA: Diagnosis not present

## 2021-05-04 NOTE — Progress Notes (Signed)
Who cardiology Office Note  Date:  05/05/2021   ID:  Heidi Middleton, DOB Jul 28, 1945, MRN [id number]  PCP:  Aurea Graff.Marlou Sa, MD   Chief Complaint  Patient presents with   12 month follow up     Patient c/o shortness of breath with exertion. Medications reviewed by the patient verbally.      HPI:  Heidi Middleton is a 76 y.o. female with PMH of s/p AVR 08/16/2015 bovine previous smoker Does exercise 4 days a week  presents for follow-up of her aortic valve stenosis, s/p AVR, HTN  2/22 Very SOB, cant walk to mailbox Deconditioned, Barely get through [employer]  Not going out much Winter very   Last echo 11/21  "Blue toes" "Circulation?"  Requesting disbailty parking placard, "can't walk far"  EKG personally reviewed by myself on todays visit NSR rate 63 no ST or T wave changes  Other past medical hx reviewed COVID November 2021 Treated with steroids, remdesivir Nausea vomiting diarrhea, syncope felt to be vasovagal Cough respiratory distress  Echo 01/2020, reviewed on today's visit  1. Left ventricular ejection fraction, by estimation, is 65 to 70%. The  left ventricle has normal function. The left ventricle has no regional  wall motion abnormalities. There is mild concentric left ventricular  hypertrophy. Left ventricular diastolic  function could not be evaluated.   2. Right ventricular systolic function is normal.  3.  21 mm pericardial tissue aortic valve. Vmax 2.3 m/s, mean gradient 12  mmHG, EOA 2.15 cm2, DI 0.62. Trivial regurgitation. The aortic valve has  been repaired/replaced. Aortic valve regurgitation is trivial. There is a  21 mm Edwards pericardial valve   Echo 11/2018  Echo prior to that 2018  [facility] 08/04/2015: Prox RCA to Mid RCA lesion, 30% stenosed.   1. Mild nonobstructive coronary artery disease. 2. Severe aortic valve stenosis with a mean gradient of 51.6 mmHg with a valve area of 0.67. 3. Right heart catheterization showed normal  pulmonary pressure, normal cardiac output and mildly elevated pulmonary wedge pressure.  Echo 2020, stable prosthetic valve   PMH:   has a past medical history of Adenomatous colon polyp (05/1985), Allergy, Aortic stenosis, Cataract, Cholelithiasis, COVID-19 virus infection (01/2020), Diverticulosis, GERD (gastroesophageal reflux disease), Headache, Heart murmur, History of prosthetic aortic valve, Hyperlipidemia, Hypertension, Osteopenia, PONV (postoperative nausea and vomiting), and Shingles.  PSH:    Past Surgical History:  Procedure Laterality Date   ABDOMINAL HYSTERECTOMY     AORTIC VALVE REPLACEMENT N/A 08/16/2015   Procedure: AORTIC VALVE REPLACEMENT (AVR);  Surgeon: Ivin Poot, MD;  Location: [place];  Service: Open Heart Surgery;  Laterality: N/A;   APPENDECTOMY     taken out with hysterectomy   BREAST EXCISIONAL BIOPSY Right 1990   EXCISIONAL - NEG   CARDIAC CATHETERIZATION Bilateral 08/04/2015   Procedure: Right/Left Heart Cath and Coronary Angiography;  Surgeon: Wellington Hampshire, MD;  Location: [place] CV LAB;  Service: Cardiovascular;  Laterality: Bilateral;   CATARACT EXTRACTION, BILATERAL  2020   ERCP N/A 10/31/2016   Procedure: ENDOSCOPIC RETROGRADE CHOLANGIOPANCREATOGRAPHY (ERCP);  Surgeon: Ladene Artist, MD;  Location: [facility] ENDOSCOPY;  Service: Endoscopy;  Laterality: N/A;   EYE SURGERY N/A    detatched retina- "not sure which eye"   KNEE SURGERY  10/2005   calcinosis - in office procedure   TEE WITHOUT CARDIOVERSION N/A 08/16/2015   Procedure: TRANSESOPHAGEAL ECHOCARDIOGRAM (TEE);  Surgeon: Ivin Poot, MD;  Location: [place];  Service: Open Heart Surgery;  Laterality: N/A;    Current Outpatient Medications  Medication Sig Dispense Refill   acetaminophen (TYLENOL) 325 MG tablet Take 650 mg by mouth every 6 (six) hours as needed for mild pain, moderate pain, fever or headache.     aspirin EC 81 MG tablet Take 1 tablet (81 mg total) by mouth daily. 90 tablet 3    cholecalciferol (VITAMIN D) 1000 UNITS tablet Take 1,000 Units by mouth 2 (two) times daily.      esomeprazole (NEXIUM) 40 MG capsule TAKE 1 CAPSULE BY MOUTH EVERY DAY BEFORE BREAKFAST 90 capsule 3   fluticasone (FLONASE) 50 MCG/ACT nasal spray Place 1-2 sprays into both nostrils daily as needed for rhinitis.     Multiple Vitamin (MULTIVITAMIN WITH MINERALS) TABS tablet Take 1 tablet by mouth daily.     omega-3 acid ethyl esters (LOVAZA) 1 g capsule Take 1 g by mouth 2 (two) times daily.     albuterol (VENTOLIN HFA) 108 (90 Base) MCG/ACT inhaler Inhale 2 puffs into the lungs every 6 (six) hours as needed for wheezing or shortness of breath. (Patient not taking: Reported on 05/05/2021) 6.7 g 0   atorvastatin (LIPITOR) 10 MG tablet Take 0.5 tablets (5 mg total) by mouth daily. 45 tablet 3   hydrochlorothiazide (HYDRODIURIL) 25 MG tablet Take 1 tablet (25 mg total) by mouth daily. 90 tablet 3   lisinopril (ZESTRIL) 20 MG tablet Take 1 tablet (20 mg total) by mouth daily. 90 tablet 3   metoprolol tartrate (LOPRESSOR) 25 MG tablet Take 1 tablet (25 mg total) by mouth 2 (two) times daily. 180 tablet 3   potassium chloride (KLOR-CON) 10 MEQ tablet Take 1 tablet (10 mEq total) by mouth daily. 90 tablet 3   No current facility-administered medications for this visit.    Allergies:   Codeine   Social History:  The patient  reports that she quit smoking about 18 years ago. Her smoking use included cigarettes. She has never used smokeless tobacco. She reports that she does not drink alcohol and does not use drugs.   Family History:   family history includes Cancer in her brother, father, and mother; Diabetes in her maternal grandmother; Heart disease in her sister; Hypertension in her brother and brother; Lung cancer in her brother; Obesity in her sister; Pancreatic cancer in her father; Stroke in her mother.    Review of Systems: Review of Systems  Constitutional: Negative.   Respiratory: Negative.     Cardiovascular: Negative.   Gastrointestinal: Negative.   Musculoskeletal: Negative.   Neurological: Negative.   Psychiatric/Behavioral: Negative.    All other systems reviewed and are negative.  PHYSICAL EXAM: VS:  BP 112/80 (BP Location: Left Arm, Patient Position: Sitting, Cuff Size: Normal)    Pulse 63    Ht 5' 7.5" (1.715 m)    Wt 167 lb 2 oz (75.8 kg)    SpO2 99%    BMI 25.79 kg/m  , BMI Body mass index is 25.79 kg/m. Constitutional:  oriented to person, place, and time. No distress.  HENT:  Head: Grossly normal Eyes:  no discharge. No scleral icterus.  Neck: No JVD, no carotid bruits  Cardiovascular: Regular rate and rhythm, no murmurs appreciated Pulmonary/Chest: Clear to auscultation bilaterally, no wheezes or rails Abdominal: Soft.  no distension.  no tenderness.  Musculoskeletal: Normal range of motion Neurological:  normal muscle tone. Coordination normal. No atrophy Skin: Skin warm and dry Psychiatric: normal affect, pleasant  Recent Labs: No results found for requested labs  within last 8760 hours.    Lipid Panel Lab Results  Component Value Date   CHOL 174 11/26/2018   HDL 71.30 11/26/2018   LDLCALC 84 11/26/2018   TRIG 50 02/03/2020      Wt Readings from Last 3 Encounters:  05/05/21 167 lb 2 oz (75.8 kg)  05/02/20 165 lb (74.8 kg)  02/10/20 170 lb (77.1 kg)     ASSESSMENT AND PLAN:  Nonrheumatic aortic valve stenosis - AVR echo in 2018, 2020, 2021 Stable Echo ordered  Essential hypertension Blood pressure is well controlled on today's visit. No changes made to the medications.  HYPERCHOLESTEROLEMIA  on lipitor, taking QOD only per PMD  Preventive care Long discussion to walk Very deconditioned at baseline, appears to be getting worse   Total encounter time more than 30 minutes  Greater than 50% was spent in counseling and coordination of care with the patient    Orders Placed This Encounter  Procedures   ECHOCARDIOGRAM COMPLETE    VAS [place] LOWER EXTREMITY ARTERIAL DUPLEX   VAS [place] ABI WITH/WO TBI     Signed, Esmond Plants, M.D., Ph.D. 05/05/2021  [place], [place]

## 2021-05-05 ENCOUNTER — Other Ambulatory Visit: Payer: Self-pay

## 2021-05-05 ENCOUNTER — Ambulatory Visit (INDEPENDENT_AMBULATORY_CARE_PROVIDER_SITE_OTHER): Payer: Medicare PPO | Admitting: Cardiovascular Disease

## 2021-05-05 ENCOUNTER — Encounter: Payer: Self-pay | Admitting: Cardiovascular Disease

## 2021-05-05 VITALS — BP 112/80 | HR 63 | Ht 67.5 in | Wt 167.1 lb

## 2021-05-05 DIAGNOSIS — I10 Essential (primary) hypertension: Secondary | ICD-10-CM

## 2021-05-05 DIAGNOSIS — E7800 Pure hypercholesterolemia, unspecified: Secondary | ICD-10-CM

## 2021-05-05 DIAGNOSIS — Z953 Presence of xenogenic heart valve: Secondary | ICD-10-CM | POA: Diagnosis not present

## 2021-05-05 DIAGNOSIS — I350 Nonrheumatic aortic (valve) stenosis: Secondary | ICD-10-CM | POA: Diagnosis not present

## 2021-05-05 MED ORDER — ATORVASTATIN CALCIUM 10 MG PO TABS: 5.0000 mg | ORAL_TABLET | Freq: Every day | ORAL | 3 refills | Status: DC

## 2021-05-05 MED ORDER — METOPROLOL TARTRATE 25 MG PO TABS: 25.0000 mg | ORAL_TABLET | Freq: Two times a day (BID) | ORAL | 3 refills | Status: DC

## 2021-05-05 MED ORDER — HYDROCHLOROTHIAZIDE 25 MG PO TABS: 25.0000 mg | ORAL_TABLET | Freq: Every day | ORAL | 3 refills | Status: DC

## 2021-05-05 MED ORDER — POTASSIUM CHLORIDE ER 10 MEQ PO TBCR: 10.0000 meq | EXTENDED_RELEASE_TABLET | Freq: Every day | ORAL | 3 refills | Status: DC

## 2021-05-05 MED ORDER — LISINOPRIL 20 MG PO TABS: 20.0000 mg | ORAL_TABLET | Freq: Every day | ORAL | 3 refills | Status: DC

## 2021-05-05 NOTE — Patient Instructions (Addendum)
Medication Instructions:  No changes  If you need a refill on your cardiac medications before your next appointment, please call your pharmacy.   Lab work: No new labs needed  Testing/Procedures: Echo Lower extremity arterial doppler/ABI  Follow-Up: At [facility], you and your health needs are our priority.  As part of our continuing mission to provide you with exceptional heart care, we have created designated Provider Care Teams.  These Care Teams include your primary Cardiologist (physician) and Advanced Practice Providers (APPs -  Physician Assistants and Nurse Practitioners) who all work together to provide you with the care you need, when you need it.  You will need a follow up appointment in 12 months  Providers on your designated Care Team:   Murray Hodgkins, NP Christell Faith, PA-C Cadence Kathlen Mody, [place]  COVID-19 Vaccine Information can be found at: [email or web address].co.uk For questions related to vaccine distribution or appointments, please email [email or web address] or call [phone].

## 2021-05-11 NOTE — Addendum Note (Signed)
Addended by: Anselm Pancoast on: 05/11/2021 09:15 AM   Modules accepted: Orders

## 2021-05-22 DIAGNOSIS — I10 Essential (primary) hypertension: Secondary | ICD-10-CM | POA: Diagnosis not present

## 2021-05-22 DIAGNOSIS — E7800 Pure hypercholesterolemia, unspecified: Secondary | ICD-10-CM | POA: Diagnosis not present

## 2021-05-22 DIAGNOSIS — K219 Gastro-esophageal reflux disease without esophagitis: Secondary | ICD-10-CM | POA: Diagnosis not present

## 2021-05-22 DIAGNOSIS — Z23 Encounter for immunization: Secondary | ICD-10-CM | POA: Diagnosis not present

## 2021-05-23 DIAGNOSIS — M62830 Muscle spasm of back: Secondary | ICD-10-CM | POA: Diagnosis not present

## 2021-05-23 DIAGNOSIS — M9905 Segmental and somatic dysfunction of pelvic region: Secondary | ICD-10-CM | POA: Diagnosis not present

## 2021-05-23 DIAGNOSIS — M9903 Segmental and somatic dysfunction of lumbar region: Secondary | ICD-10-CM | POA: Diagnosis not present

## 2021-05-23 DIAGNOSIS — M955 Acquired deformity of pelvis: Secondary | ICD-10-CM | POA: Diagnosis not present

## 2021-05-31 ENCOUNTER — Other Ambulatory Visit: Payer: Self-pay | Admitting: Cardiovascular Disease

## 2021-05-31 DIAGNOSIS — R230 Cyanosis: Secondary | ICD-10-CM

## 2021-06-08 ENCOUNTER — Other Ambulatory Visit: Payer: Medicare PPO

## 2021-06-20 DIAGNOSIS — M955 Acquired deformity of pelvis: Secondary | ICD-10-CM | POA: Diagnosis not present

## 2021-06-20 DIAGNOSIS — M9905 Segmental and somatic dysfunction of pelvic region: Secondary | ICD-10-CM | POA: Diagnosis not present

## 2021-06-20 DIAGNOSIS — M9903 Segmental and somatic dysfunction of lumbar region: Secondary | ICD-10-CM | POA: Diagnosis not present

## 2021-06-20 DIAGNOSIS — M62830 Muscle spasm of back: Secondary | ICD-10-CM | POA: Diagnosis not present

## 2021-06-22 ENCOUNTER — Ambulatory Visit (INDEPENDENT_AMBULATORY_CARE_PROVIDER_SITE_OTHER): Payer: Medicare PPO

## 2021-06-22 DIAGNOSIS — Z953 Presence of xenogenic heart valve: Secondary | ICD-10-CM

## 2021-06-22 DIAGNOSIS — R230 Cyanosis: Secondary | ICD-10-CM

## 2021-06-22 DIAGNOSIS — M6281 Muscle weakness (generalized): Secondary | ICD-10-CM

## 2021-06-22 LAB — ECHOCARDIOGRAM COMPLETE
AR max vel: 2.26 cm2
AV Area VTI: 2.85 cm2
AV Area mean vel: 2.19 cm2
AV Mean grad: 4.3 mmHg
AV Peak grad: 11 mmHg
Ao pk vel: 1.66 m/s
Area-P 1/2: 3.68 cm2
Calc EF: 55.7 %
S' Lateral: 2.6 cm
Single Plane A2C EF: 53.5 %
Single Plane A4C EF: 58.8 %

## 2021-07-10 ENCOUNTER — Other Ambulatory Visit: Payer: Self-pay | Admitting: Family Medicine

## 2021-07-10 DIAGNOSIS — Z1231 Encounter for screening mammogram for malignant neoplasm of breast: Secondary | ICD-10-CM

## 2021-07-18 DIAGNOSIS — M9903 Segmental and somatic dysfunction of lumbar region: Secondary | ICD-10-CM | POA: Diagnosis not present

## 2021-07-18 DIAGNOSIS — M62830 Muscle spasm of back: Secondary | ICD-10-CM | POA: Diagnosis not present

## 2021-07-18 DIAGNOSIS — M955 Acquired deformity of pelvis: Secondary | ICD-10-CM | POA: Diagnosis not present

## 2021-07-18 DIAGNOSIS — M9905 Segmental and somatic dysfunction of pelvic region: Secondary | ICD-10-CM | POA: Diagnosis not present

## 2021-08-22 DIAGNOSIS — M9903 Segmental and somatic dysfunction of lumbar region: Secondary | ICD-10-CM | POA: Diagnosis not present

## 2021-08-22 DIAGNOSIS — M955 Acquired deformity of pelvis: Secondary | ICD-10-CM | POA: Diagnosis not present

## 2021-08-22 DIAGNOSIS — M62830 Muscle spasm of back: Secondary | ICD-10-CM | POA: Diagnosis not present

## 2021-08-22 DIAGNOSIS — M9905 Segmental and somatic dysfunction of pelvic region: Secondary | ICD-10-CM | POA: Diagnosis not present

## 2021-08-24 ENCOUNTER — Ambulatory Visit
Admission: RE | Admit: 2021-08-24 | Discharge: 2021-08-24 | Disposition: A | Discharge status: Home / Self Care | Payer: Medicare PPO | Source: Ambulatory Visit | Attending: Family Medicine | Admitting: Family Medicine

## 2021-08-24 DIAGNOSIS — Z1231 Encounter for screening mammogram for malignant neoplasm of breast: Secondary | ICD-10-CM | POA: Diagnosis not present

## 2021-09-18 DIAGNOSIS — M9903 Segmental and somatic dysfunction of lumbar region: Secondary | ICD-10-CM | POA: Diagnosis not present

## 2021-09-18 DIAGNOSIS — M9905 Segmental and somatic dysfunction of pelvic region: Secondary | ICD-10-CM | POA: Diagnosis not present

## 2021-09-18 DIAGNOSIS — M955 Acquired deformity of pelvis: Secondary | ICD-10-CM | POA: Diagnosis not present

## 2021-09-18 DIAGNOSIS — M62830 Muscle spasm of back: Secondary | ICD-10-CM | POA: Diagnosis not present

## 2021-09-27 DIAGNOSIS — Z8249 Family history of ischemic heart disease and other diseases of the circulatory system: Secondary | ICD-10-CM | POA: Diagnosis not present

## 2021-09-27 DIAGNOSIS — Z7982 Long term (current) use of aspirin: Secondary | ICD-10-CM | POA: Diagnosis not present

## 2021-09-27 DIAGNOSIS — I10 Essential (primary) hypertension: Secondary | ICD-10-CM | POA: Diagnosis not present

## 2021-09-27 DIAGNOSIS — E785 Hyperlipidemia, unspecified: Secondary | ICD-10-CM | POA: Diagnosis not present

## 2021-09-27 DIAGNOSIS — Z809 Family history of malignant neoplasm, unspecified: Secondary | ICD-10-CM | POA: Diagnosis not present

## 2021-09-27 DIAGNOSIS — K5900 Constipation, unspecified: Secondary | ICD-10-CM | POA: Diagnosis not present

## 2021-09-27 DIAGNOSIS — Z87891 Personal history of nicotine dependence: Secondary | ICD-10-CM | POA: Diagnosis not present

## 2021-09-27 DIAGNOSIS — Z885 Allergy status to narcotic agent status: Secondary | ICD-10-CM | POA: Diagnosis not present

## 2021-09-27 DIAGNOSIS — K219 Gastro-esophageal reflux disease without esophagitis: Secondary | ICD-10-CM | POA: Diagnosis not present

## 2021-10-16 DIAGNOSIS — M9905 Segmental and somatic dysfunction of pelvic region: Secondary | ICD-10-CM | POA: Diagnosis not present

## 2021-10-16 DIAGNOSIS — M62830 Muscle spasm of back: Secondary | ICD-10-CM | POA: Diagnosis not present

## 2021-10-16 DIAGNOSIS — M9903 Segmental and somatic dysfunction of lumbar region: Secondary | ICD-10-CM | POA: Diagnosis not present

## 2021-10-16 DIAGNOSIS — M955 Acquired deformity of pelvis: Secondary | ICD-10-CM | POA: Diagnosis not present

## 2021-10-17 DIAGNOSIS — M62830 Muscle spasm of back: Secondary | ICD-10-CM | POA: Diagnosis not present

## 2021-10-17 DIAGNOSIS — M9905 Segmental and somatic dysfunction of pelvic region: Secondary | ICD-10-CM | POA: Diagnosis not present

## 2021-10-17 DIAGNOSIS — M9903 Segmental and somatic dysfunction of lumbar region: Secondary | ICD-10-CM | POA: Diagnosis not present

## 2021-10-17 DIAGNOSIS — M955 Acquired deformity of pelvis: Secondary | ICD-10-CM | POA: Diagnosis not present

## 2021-11-14 DIAGNOSIS — M955 Acquired deformity of pelvis: Secondary | ICD-10-CM | POA: Diagnosis not present

## 2021-11-14 DIAGNOSIS — M62830 Muscle spasm of back: Secondary | ICD-10-CM | POA: Diagnosis not present

## 2021-11-14 DIAGNOSIS — M9903 Segmental and somatic dysfunction of lumbar region: Secondary | ICD-10-CM | POA: Diagnosis not present

## 2021-11-14 DIAGNOSIS — M9905 Segmental and somatic dysfunction of pelvic region: Secondary | ICD-10-CM | POA: Diagnosis not present

## 2021-12-01 DIAGNOSIS — I10 Essential (primary) hypertension: Secondary | ICD-10-CM | POA: Diagnosis not present

## 2021-12-01 DIAGNOSIS — M8588 Other specified disorders of bone density and structure, other site: Secondary | ICD-10-CM | POA: Diagnosis not present

## 2021-12-01 DIAGNOSIS — K219 Gastro-esophageal reflux disease without esophagitis: Secondary | ICD-10-CM | POA: Diagnosis not present

## 2021-12-01 DIAGNOSIS — Z1159 Encounter for screening for other viral diseases: Secondary | ICD-10-CM | POA: Diagnosis not present

## 2021-12-01 DIAGNOSIS — F329 Major depressive disorder, single episode, unspecified: Secondary | ICD-10-CM | POA: Diagnosis not present

## 2021-12-01 DIAGNOSIS — E7800 Pure hypercholesterolemia, unspecified: Secondary | ICD-10-CM | POA: Diagnosis not present

## 2021-12-01 DIAGNOSIS — Z952 Presence of prosthetic heart valve: Secondary | ICD-10-CM | POA: Diagnosis not present

## 2021-12-01 DIAGNOSIS — Z0000 Encounter for general adult medical examination without abnormal findings: Secondary | ICD-10-CM | POA: Diagnosis not present

## 2021-12-12 DIAGNOSIS — M9905 Segmental and somatic dysfunction of pelvic region: Secondary | ICD-10-CM | POA: Diagnosis not present

## 2021-12-12 DIAGNOSIS — M9903 Segmental and somatic dysfunction of lumbar region: Secondary | ICD-10-CM | POA: Diagnosis not present

## 2021-12-12 DIAGNOSIS — M62830 Muscle spasm of back: Secondary | ICD-10-CM | POA: Diagnosis not present

## 2021-12-12 DIAGNOSIS — M955 Acquired deformity of pelvis: Secondary | ICD-10-CM | POA: Diagnosis not present

## 2021-12-28 DIAGNOSIS — M48062 Spinal stenosis, lumbar region with neurogenic claudication: Secondary | ICD-10-CM | POA: Diagnosis not present

## 2021-12-28 DIAGNOSIS — Z6826 Body mass index (BMI) 26.0-26.9, adult: Secondary | ICD-10-CM | POA: Diagnosis not present

## 2022-01-03 DIAGNOSIS — M5126 Other intervertebral disc displacement, lumbar region: Secondary | ICD-10-CM | POA: Diagnosis not present

## 2022-01-03 DIAGNOSIS — M48062 Spinal stenosis, lumbar region with neurogenic claudication: Secondary | ICD-10-CM | POA: Diagnosis not present

## 2022-01-08 ENCOUNTER — Telehealth: Payer: Self-pay | Admitting: Cardiovascular Disease

## 2022-01-08 NOTE — Telephone Encounter (Signed)
Patient stated that for the past 2 weeks, her left arm SBP has been greater than 140. She also check with a manual cuff with same readings. SBP ranges from 150 to 172. For the past week, she has been taking lisinopril '20mg'$  in the AM and '20mg'$  in the PM with no decrease in SBP. She is asymptomatic unless SBP 170, which causes slight headache. She did report chronic dull pain in her mid back 5/10. She did a rt arm BP while on phone 163/63. Please advise on BP.

## 2022-01-08 NOTE — Telephone Encounter (Signed)
.  Pt c/o BP issue: STAT if pt c/o blurred vision, one-sided weakness or slurred speech  1. What are your last 5 BP readings? The last 2 weeks her top reading have been over 140 - she did not have the bottom readings   2. Are you having any other symptoms (ex. Dizziness, headache, blurred vision, passed out)? A little headache when it is high  3. What is your BP issue? She was told that he wanted her blood pressure under 140

## 2022-01-09 DIAGNOSIS — M62830 Muscle spasm of back: Secondary | ICD-10-CM | POA: Diagnosis not present

## 2022-01-09 DIAGNOSIS — M9905 Segmental and somatic dysfunction of pelvic region: Secondary | ICD-10-CM | POA: Diagnosis not present

## 2022-01-09 DIAGNOSIS — M955 Acquired deformity of pelvis: Secondary | ICD-10-CM | POA: Diagnosis not present

## 2022-01-09 DIAGNOSIS — M9903 Segmental and somatic dysfunction of lumbar region: Secondary | ICD-10-CM | POA: Diagnosis not present

## 2022-01-09 MED ORDER — AMLODIPINE BESYLATE 5 MG PO TABS: 5.0000 mg | ORAL_TABLET | Freq: Every day | ORAL | 6 refills | Status: DC

## 2022-01-16 DIAGNOSIS — M48062 Spinal stenosis, lumbar region with neurogenic claudication: Secondary | ICD-10-CM | POA: Diagnosis not present

## 2022-01-16 DIAGNOSIS — E7800 Pure hypercholesterolemia, unspecified: Secondary | ICD-10-CM | POA: Diagnosis not present

## 2022-02-06 DIAGNOSIS — M62830 Muscle spasm of back: Secondary | ICD-10-CM | POA: Diagnosis not present

## 2022-02-06 DIAGNOSIS — M9903 Segmental and somatic dysfunction of lumbar region: Secondary | ICD-10-CM | POA: Diagnosis not present

## 2022-02-06 DIAGNOSIS — M955 Acquired deformity of pelvis: Secondary | ICD-10-CM | POA: Diagnosis not present

## 2022-02-06 DIAGNOSIS — M9905 Segmental and somatic dysfunction of pelvic region: Secondary | ICD-10-CM | POA: Diagnosis not present

## 2022-02-28 DIAGNOSIS — M546 Pain in thoracic spine: Secondary | ICD-10-CM | POA: Diagnosis not present

## 2022-02-28 DIAGNOSIS — M5451 Vertebrogenic low back pain: Secondary | ICD-10-CM | POA: Diagnosis not present

## 2022-03-02 DIAGNOSIS — M546 Pain in thoracic spine: Secondary | ICD-10-CM | POA: Diagnosis not present

## 2022-03-02 DIAGNOSIS — M5451 Vertebrogenic low back pain: Secondary | ICD-10-CM | POA: Diagnosis not present

## 2022-03-06 DIAGNOSIS — M955 Acquired deformity of pelvis: Secondary | ICD-10-CM | POA: Diagnosis not present

## 2022-03-06 DIAGNOSIS — M9905 Segmental and somatic dysfunction of pelvic region: Secondary | ICD-10-CM | POA: Diagnosis not present

## 2022-03-06 DIAGNOSIS — M9903 Segmental and somatic dysfunction of lumbar region: Secondary | ICD-10-CM | POA: Diagnosis not present

## 2022-03-06 DIAGNOSIS — M546 Pain in thoracic spine: Secondary | ICD-10-CM | POA: Diagnosis not present

## 2022-03-06 DIAGNOSIS — M5451 Vertebrogenic low back pain: Secondary | ICD-10-CM | POA: Diagnosis not present

## 2022-03-06 DIAGNOSIS — M62830 Muscle spasm of back: Secondary | ICD-10-CM | POA: Diagnosis not present

## 2022-03-09 DIAGNOSIS — M5451 Vertebrogenic low back pain: Secondary | ICD-10-CM | POA: Diagnosis not present

## 2022-03-09 DIAGNOSIS — M546 Pain in thoracic spine: Secondary | ICD-10-CM | POA: Diagnosis not present

## 2022-03-13 DIAGNOSIS — M546 Pain in thoracic spine: Secondary | ICD-10-CM | POA: Diagnosis not present

## 2022-03-13 DIAGNOSIS — M5451 Vertebrogenic low back pain: Secondary | ICD-10-CM | POA: Diagnosis not present

## 2022-03-15 DIAGNOSIS — M5451 Vertebrogenic low back pain: Secondary | ICD-10-CM | POA: Diagnosis not present

## 2022-03-15 DIAGNOSIS — M546 Pain in thoracic spine: Secondary | ICD-10-CM | POA: Diagnosis not present

## 2022-03-21 DIAGNOSIS — M546 Pain in thoracic spine: Secondary | ICD-10-CM | POA: Diagnosis not present

## 2022-03-21 DIAGNOSIS — M5451 Vertebrogenic low back pain: Secondary | ICD-10-CM | POA: Diagnosis not present

## 2022-03-22 DIAGNOSIS — Z6825 Body mass index (BMI) 25.0-25.9, adult: Secondary | ICD-10-CM | POA: Diagnosis not present

## 2022-03-22 DIAGNOSIS — M48062 Spinal stenosis, lumbar region with neurogenic claudication: Secondary | ICD-10-CM | POA: Diagnosis not present

## 2022-03-23 DIAGNOSIS — M546 Pain in thoracic spine: Secondary | ICD-10-CM | POA: Diagnosis not present

## 2022-03-23 DIAGNOSIS — M5451 Vertebrogenic low back pain: Secondary | ICD-10-CM | POA: Diagnosis not present

## 2022-03-27 DIAGNOSIS — M5451 Vertebrogenic low back pain: Secondary | ICD-10-CM | POA: Diagnosis not present

## 2022-03-27 DIAGNOSIS — M546 Pain in thoracic spine: Secondary | ICD-10-CM | POA: Diagnosis not present

## 2022-03-30 DIAGNOSIS — M5451 Vertebrogenic low back pain: Secondary | ICD-10-CM | POA: Diagnosis not present

## 2022-03-30 DIAGNOSIS — M546 Pain in thoracic spine: Secondary | ICD-10-CM | POA: Diagnosis not present

## 2022-04-03 DIAGNOSIS — M9905 Segmental and somatic dysfunction of pelvic region: Secondary | ICD-10-CM | POA: Diagnosis not present

## 2022-04-03 DIAGNOSIS — M5451 Vertebrogenic low back pain: Secondary | ICD-10-CM | POA: Diagnosis not present

## 2022-04-03 DIAGNOSIS — M955 Acquired deformity of pelvis: Secondary | ICD-10-CM | POA: Diagnosis not present

## 2022-04-03 DIAGNOSIS — M62830 Muscle spasm of back: Secondary | ICD-10-CM | POA: Diagnosis not present

## 2022-04-03 DIAGNOSIS — M9903 Segmental and somatic dysfunction of lumbar region: Secondary | ICD-10-CM | POA: Diagnosis not present

## 2022-04-03 DIAGNOSIS — M546 Pain in thoracic spine: Secondary | ICD-10-CM | POA: Diagnosis not present

## 2022-04-06 DIAGNOSIS — M5451 Vertebrogenic low back pain: Secondary | ICD-10-CM | POA: Diagnosis not present

## 2022-04-06 DIAGNOSIS — M546 Pain in thoracic spine: Secondary | ICD-10-CM | POA: Diagnosis not present

## 2022-04-10 DIAGNOSIS — M546 Pain in thoracic spine: Secondary | ICD-10-CM | POA: Diagnosis not present

## 2022-04-10 DIAGNOSIS — M5451 Vertebrogenic low back pain: Secondary | ICD-10-CM | POA: Diagnosis not present

## 2022-04-13 DIAGNOSIS — M546 Pain in thoracic spine: Secondary | ICD-10-CM | POA: Diagnosis not present

## 2022-04-13 DIAGNOSIS — M5451 Vertebrogenic low back pain: Secondary | ICD-10-CM | POA: Diagnosis not present

## 2022-04-17 DIAGNOSIS — M5451 Vertebrogenic low back pain: Secondary | ICD-10-CM | POA: Diagnosis not present

## 2022-04-17 DIAGNOSIS — M546 Pain in thoracic spine: Secondary | ICD-10-CM | POA: Diagnosis not present

## 2022-04-20 DIAGNOSIS — M5451 Vertebrogenic low back pain: Secondary | ICD-10-CM | POA: Diagnosis not present

## 2022-04-20 DIAGNOSIS — M546 Pain in thoracic spine: Secondary | ICD-10-CM | POA: Diagnosis not present

## 2022-05-01 DIAGNOSIS — M9905 Segmental and somatic dysfunction of pelvic region: Secondary | ICD-10-CM | POA: Diagnosis not present

## 2022-05-01 DIAGNOSIS — M955 Acquired deformity of pelvis: Secondary | ICD-10-CM | POA: Diagnosis not present

## 2022-05-01 DIAGNOSIS — M9903 Segmental and somatic dysfunction of lumbar region: Secondary | ICD-10-CM | POA: Diagnosis not present

## 2022-05-01 DIAGNOSIS — M62830 Muscle spasm of back: Secondary | ICD-10-CM | POA: Diagnosis not present

## 2022-05-29 ENCOUNTER — Other Ambulatory Visit: Payer: Self-pay | Admitting: Cardiovascular Disease

## 2022-05-29 DIAGNOSIS — M9903 Segmental and somatic dysfunction of lumbar region: Secondary | ICD-10-CM | POA: Diagnosis not present

## 2022-05-29 DIAGNOSIS — M9905 Segmental and somatic dysfunction of pelvic region: Secondary | ICD-10-CM | POA: Diagnosis not present

## 2022-05-29 DIAGNOSIS — M62830 Muscle spasm of back: Secondary | ICD-10-CM | POA: Diagnosis not present

## 2022-05-29 DIAGNOSIS — M955 Acquired deformity of pelvis: Secondary | ICD-10-CM | POA: Diagnosis not present

## 2022-05-29 NOTE — Telephone Encounter (Signed)
Please contact pt for future appointment.  Pt overdue for 12 Month f/u. Pt needing refills.

## 2022-05-30 DIAGNOSIS — K219 Gastro-esophageal reflux disease without esophagitis: Secondary | ICD-10-CM | POA: Diagnosis not present

## 2022-05-30 DIAGNOSIS — I10 Essential (primary) hypertension: Secondary | ICD-10-CM | POA: Diagnosis not present

## 2022-05-30 DIAGNOSIS — M8588 Other specified disorders of bone density and structure, other site: Secondary | ICD-10-CM | POA: Diagnosis not present

## 2022-05-30 DIAGNOSIS — E7800 Pure hypercholesterolemia, unspecified: Secondary | ICD-10-CM | POA: Diagnosis not present

## 2022-05-30 DIAGNOSIS — Z952 Presence of prosthetic heart valve: Secondary | ICD-10-CM | POA: Diagnosis not present

## 2022-05-30 DIAGNOSIS — F329 Major depressive disorder, single episode, unspecified: Secondary | ICD-10-CM | POA: Diagnosis not present

## 2022-05-30 NOTE — Telephone Encounter (Signed)
Pt is scheduled on 3/27.

## 2022-06-12 NOTE — Progress Notes (Unsigned)
Cardiology Office Note:    Date:  06/13/2022   ID:  [name], DOB [birth year], MRN [id number]  PCP:  Aurea Graff.Marlou Sa, [place] Providers Cardiologist:  Ida Rogue, MD {  Referring MD: Alroy Dust, Carlean Jews.Marlou Sa, MD   Chief Complaint  Patient presents with   Follow-up    12 month follow up. Patient states that she experiences shortness of breath all the time. Meds reviewed.     History of Present Illness:    Heidi Middleton is a 77 y.o. female with a hx of aortic stenosis s/p bovine AVR in 07/2015, history of tobacco use, hypertension, hyperlipidemia.  Patient is followed by Dr. Rockey Situ and presents today for her yearly follow-up.  Patient had an echocardiogram in 06/2015 that showed EF 65-70%, no regional wall motion abnormalities, grade 1 diastolic dysfunction, moderate-likely severe aortic valve stenosis.  Underwent right/left heart catheterization on 08/04/2015 that showed mild, nonobstructive coronary artery disease, severe aortic valve stenosis with mean gradient of 51.6 mmHg.  Recommended that patient undergo aortic valve replacement.  She underwent aortic valve replacement with bovine valve on 08/16/2015.  Follow-up echocardiogram on 10/05/2015 showed EF 60-65%, no regional wall motion abnormalities, grade 1 diastolic dysfunction.  A bioprosthesis valve was present in the aortic position with no stenosis, transvalvular velocity was within normal range.  Patient was last seen by cardiology on 05/05/2021.  At that appointment, she complained SOB on minimal exertion, and was instructed to increase her activity level as tolerated.  Blood pressure was well-controlled at that appointment.  Echocardiogram 06/22/2021 showed EF 60-65%, no regional wall motion abnormalities, mild LVH, grade 2 diastolic dysfunction.  The aortic valve had been replaced.  There was no regurgitation, mean gradient measured 4.3 mmHg.  Today, patient reports that she has continued to have shortness of  breath.  She has been trying to exercise more, and has noticed some improvement in symptoms.  She does yoga and has been taking some workout classes.  Since starting the exercise more, she has noticed that her shortness of breath has been improving.  She still gets short of breath on heavy exertion, but is now able to grocery shop, work in her garden, and walk around her home without becoming short of breath.  She denies having chest pain, palpitations, syncope, near syncope, dizziness.  Denies ankle edema, abdominal distention, orthopnea.  She saw her primary care provider few weeks ago and had labs drawn at that appointment.  Reports that her LDL was 90.  She does have some low back pain and was seen by ortho.  They did an MRI and told her that her symptoms were due to arthritic changes.  No plans for surgery at this time.   Past Medical History:  Diagnosis Date   Adenomatous colon polyp 05/1985   Allergy    Aortic stenosis    Cataract    Cholelithiasis    COVID-19 virus infection 01/2020   Diverticulosis    GERD (gastroesophageal reflux disease)    Headache    migraines prior to hysterectomy   Heart murmur    History of prosthetic aortic valve    a. 07/2015 s/p bioprosthetic AVR; b. 01/2020 Echo: EF 65-70%, no rmwa, RVSP 28.67mmHg, 53mm pericardial tissue AoV - Vmax 2.11m/s, mean grad 76mmHg. triv AI.   Hyperlipidemia    Hypertension    Osteopenia    PONV (postoperative nausea and vomiting)    nausea   Shingles     Past Surgical History:  Procedure Laterality Date   ABDOMINAL HYSTERECTOMY     AORTIC VALVE REPLACEMENT N/A 08/16/2015   Procedure: AORTIC VALVE REPLACEMENT (AVR);  Surgeon: Ivin Poot, MD;  Location: [facility];  Service: Open Heart Surgery;  Laterality: N/A;   APPENDECTOMY     taken out with hysterectomy   BREAST EXCISIONAL BIOPSY Right 1990   EXCISIONAL - NEG   CARDIAC CATHETERIZATION Bilateral 08/04/2015   Procedure: Right/Left Heart Cath and Coronary Angiography;   Surgeon: Wellington Hampshire, MD;  Location: [facility];  Service: Cardiovascular;  Laterality: Bilateral;   CATARACT EXTRACTION, BILATERAL  2020   ERCP N/A 10/31/2016   Procedure: ENDOSCOPIC RETROGRADE CHOLANGIOPANCREATOGRAPHY (ERCP);  Surgeon: Ladene Artist, MD;  Location: [facility] END[facility];  Service: Endoscopy;  Laterality: N/A;   EYE SURGERY N/A    detatched retina- "not sure which eye"   KNEE SURGERY  10/2005   calcinosis - in office procedure   TEE WITHOUT CARDIOVERSION N/A 08/16/2015   Procedure: TRANSESOPHAGEAL ECHOCARDIOGRAM (TEE);  Surgeon: Ivin Poot, MD;  Location: [facility];  Service: Open Heart Surgery;  Laterality: N/A;    Current Medications: Current Meds  Medication Sig   acetaminophen (TYLENOL) 325 MG tablet Take 650 mg by mouth every 6 (six) hours as needed for mild pain, moderate pain, fever or headache.   amLODipine (NORVASC) 5 MG tablet Take 1 tablet (5 mg total) by mouth daily.   aspirin EC 81 MG tablet Take 1 tablet (81 mg total) by mouth daily.   atorvastatin (LIPITOR) 10 MG tablet Take 0.5 tablets (5 mg total) by mouth daily.   cholecalciferol (VITAMIN D) 1000 UNITS tablet Take 1,000 Units by mouth 2 (two) times daily.    esomeprazole (NEXIUM) 40 MG capsule TAKE 1 CAPSULE BY MOUTH EVERY DAY BEFORE BREAKFAST   fluticasone (FLONASE) 50 MCG/ACT nasal spray Place 1-2 sprays into both nostrils daily as needed for rhinitis.   hydrochlorothiazide (HYDRODIURIL) 25 MG tablet Take 1 tablet (25 mg total) by mouth daily. Additional refill available at office visit   lisinopril (ZESTRIL) 20 MG tablet Take 1 tablet (20 mg total) by mouth daily. Additional refill available at office visit   metoprolol tartrate (LOPRESSOR) 25 MG tablet Take 1 tablet (25 mg total) by mouth 2 (two) times daily.   Multiple Vitamin (MULTIVITAMIN WITH MINERALS) TABS tablet Take 1 tablet by mouth daily.   omega-3 acid ethyl esters (LOVAZA) 1 g capsule Take 1 g by mouth 2 (two) times daily.    potassium chloride (KLOR-CON) 10 MEQ tablet Take 1 tablet (10 mEq total) by mouth daily. Additional refill available at office visit     Allergies:   Codeine   Social History   Socioeconomic History   Marital status: Married    Spouse name: Not on file   Number of children: 3   Years of education: Not on file   Highest education level: Not on file  Occupational History   Occupation: Pharmacist, hospital   Occupation: retired  Tobacco Use   Smoking status: Former    Years: 25    Types: Cigarettes    Quit date: 07/07/2002    Years since quitting: 19.9   Smokeless tobacco: Never   Tobacco comments:    light smoker before quitting   Vaping Use   Vaping Use: Never used  Substance and Sexual Activity   Alcohol use: No    Alcohol/week: 0.0 standard drinks of alcohol   Drug use: No   Sexual activity: Not Currently  Other Topics Concern   Not on file  Social History Narrative   Not on file   Social Determinants of Health   Financial Resource Strain: Low Risk  (11/25/2018)   Overall Financial Resource Strain (CARDIA)    Difficulty of Paying Living Expenses: Not hard at all  Food Insecurity: No Food Insecurity (11/25/2018)   Hunger Vital Sign    Worried About Running Out of Food in the Last Year: Never true    Ran Out of Food in the Last Year: Never true  Transportation Needs: No Transportation Needs (11/25/2018)   PRAPARE - Hydrologist (Medical): No    Lack of Transportation (Non-Medical): No  Physical Activity: Inactive (11/25/2018)   Exercise Vital Sign    Days of Exercise per Week: 0 days    Minutes of Exercise per Session: 0 min  Stress: No Stress Concern Present (11/25/2018)   [place]    Feeling of Stress : Not at all  Social Connections: Not on file     Family History: The patient's family history includes Cancer in her brother, father, and mother; Diabetes in her maternal grandmother;  Heart disease in her sister; Hypertension in her brother and brother; Lung cancer in her brother; Obesity in her sister; Pancreatic cancer in her father; Stroke in her mother. There is no history of Breast cancer, Colon cancer, Esophageal cancer, Rectal cancer, or Stomach cancer.  ROS:   Please see the history of present illness.    All other systems reviewed and are negative.  EKGs/Labs/Other Studies Reviewed:    The following studies were reviewed today: Cardiac Studies & Procedures   CARDIAC CATHETERIZATION  CARDIAC CATHETERIZATION 08/04/2015  Narrative  Prox RCA to Mid RCA lesion, 30% stenosed.  1. Mild nonobstructive coronary artery disease. 2. Severe aortic valve stenosis with a mean gradient of 51.6 mmHg with a valve area of 0.67. 3. Right heart catheterization showed normal pulmonary pressure, normal cardiac output and mildly elevated pulmonary wedge pressure.  Recommendations:  Aortic valve replacement.  Findings Coronary Findings Diagnostic  Dominance: Right  Left Main . Vessel is angiographically normal.  Left Anterior Descending The vessel exhibits minimal luminal irregularities.  First Diagonal Branch The vessel exhibits minimal luminal irregularities.  Second Diagonal Branch The vessel exhibits minimal luminal irregularities.  Ramus Intermedius . Vessel is small. Vessel is angiographically normal.  Left Circumflex The vessel exhibits minimal luminal irregularities.  First Obtuse Marginal Branch The vessel is small in size and exhibits minimal luminal irregularities.  Second Obtuse Marginal Branch The vessel is small in size and exhibits minimal luminal irregularities.  Third Obtuse Marginal Branch The vessel is small in size and exhibits minimal luminal irregularities.  Right Coronary Artery Mildly Calcified.  Right Posterior Descending Artery The vessel exhibits minimal luminal irregularities.  Intervention  No interventions have been  documented.     ECHOCARDIOGRAM  ECHOCARDIOGRAM COMPLETE 06/22/2021  Narrative ECHOCARDIOGRAM REPORT    Patient Name:   Heidi Middleton Date of Exam: 06/22/2021 Medical Rec #:  [id number]      Height:       67.5 in Accession #:    [id number]     Weight:       167.1 lb Date of Birth:  Mar 31, 1945      BSA:          1.884 m Patient Age:    73 years       BP:  120/80 mmHg Patient Gender: F              HR:           66 bpm. Exam Location:  [facility]  Procedure: 2D Echo, Cardiac Doppler and Color Doppler  Indications:    I35.8 Other nonrheumatic aortic valve disorders  History:        Patient has prior history of Echocardiogram examinations, most recent 02/05/2020. Aortic Valve: 21 mm Edwards Edwards Magna-Ease valve is present in the aortic position. Procedure Date: 08/16/15.  Sonographer:    Pilar Jarvis RDMS, RVT, RDCS Referring Phys: [id number] Minna Merritts   Sonographer Comments: Very limited acoustic windows and poor AV Doppler angles IMPRESSIONS   1. Left ventricular ejection fraction, by estimation, is 60 to 65%. The left ventricle has normal function. The left ventricle has no regional wall motion abnormalities. There is mild left ventricular hypertrophy. Left ventricular diastolic parameters are consistent with Grade II diastolic dysfunction (pseudonormalization). The average left ventricular global longitudinal strain is -17.1 %. The global longitudinal strain is normal. 2. Right ventricular systolic function is normal. The right ventricular size is normal. 3. The mitral valve is normal in structure. No evidence of mitral valve regurgitation. 4. The aortic valve has been repaired/replaced. Aortic valve regurgitation is not visualized. There is a 21 mm Edwards Edwards Magna-Ease valve present in the aortic position. Procedure Date: 08/16/15. Aortic valve mean gradient measures 4.3 mmHg. 5. The inferior vena cava is normal in size with greater than 50% respiratory  variability, suggesting right atrial pressure of 3 mmHg.  FINDINGS Left Ventricle: Left ventricular ejection fraction, by estimation, is 60 to 65%. The left ventricle has normal function. The left ventricle has no regional wall motion abnormalities. The average left ventricular global longitudinal strain is -17.1 %. The global longitudinal strain is normal. The left ventricular internal cavity size was normal in size. There is mild left ventricular hypertrophy. Left ventricular diastolic parameters are consistent with Grade II diastolic dysfunction (pseudonormalization).  Right Ventricle: The right ventricular size is normal. No increase in right ventricular wall thickness. Right ventricular systolic function is normal.  Left Atrium: Left atrial size was normal in size.  Right Atrium: Right atrial size was normal in size.  Pericardium: There is no evidence of pericardial effusion.  Mitral Valve: The mitral valve is normal in structure. No evidence of mitral valve regurgitation.  Tricuspid Valve: The tricuspid valve is not well visualized. Tricuspid valve regurgitation is not demonstrated.  Aortic Valve: The aortic valve has been repaired/replaced. Aortic valve regurgitation is not visualized. Aortic valve mean gradient measures 4.3 mmHg. Aortic valve peak gradient measures 11.0 mmHg. Aortic valve area, by VTI measures 2.85 cm. There is a 21 mm Edwards Edwards Magna-Ease valve present in the aortic position. Procedure Date: 08/16/15.  Pulmonic Valve: The pulmonic valve was not well visualized. Pulmonic valve regurgitation is trivial.  Aorta: The aortic root and ascending aorta are structurally normal, with no evidence of dilitation.  Venous: The inferior vena cava is normal in size with greater than 50% respiratory variability, suggesting right atrial pressure of 3 mmHg.  IAS/Shunts: No atrial level shunt detected by color flow Doppler.   LEFT VENTRICLE PLAX 2D LVIDd:         4.00 cm      Diastology LVIDs:         2.60 cm     LV e' medial:    5.77 cm/s LV PW:  1.00 cm     LV E/e' medial:  15.9 LV IVS:        1.40 cm     LV e' lateral:   5.00 cm/s LVOT diam:     1.90 cm     LV E/e' lateral: 18.4 LV SV:         94 LV SV Index:   50          2D Longitudinal Strain LVOT Area:     2.84 cm    2D Strain GLS Avg:     -17.1 %  LV Volumes (MOD) LV vol d, MOD A2C: 69.2 ml LV vol d, MOD A4C: 59.4 ml LV vol s, MOD A2C: 32.2 ml LV vol s, MOD A4C: 24.5 ml LV SV MOD A2C:     37.0 ml LV SV MOD A4C:     59.4 ml LV SV MOD BP:      35.8 ml  RIGHT VENTRICLE            IVC RV S prime:     9.90 cm/s  IVC diam: 1.60 cm TAPSE (M-mode): 2.2 cm  LEFT ATRIUM             Index        RIGHT ATRIUM           Index LA diam:        3.40 cm 1.80 cm/m   RA Area:     14.60 cm LA Vol (A2C):   48.2 ml 25.58 ml/m  RA Volume:   33.70 ml  17.89 ml/m LA Vol (A4C):   27.9 ml 14.81 ml/m LA Biplane Vol: 38.9 ml 20.65 ml/m AORTIC VALVE                     PULMONIC VALVE AV Area (Vmax):    2.26 cm      PV Vmax:       1.00 m/s AV Area (Vmean):   2.19 cm      PV Peak grad:  4.0 mmHg AV Area (VTI):     2.85 cm AV Vmax:           166.00 cm/s AV Vmean:          113.000 cm/s AV VTI:            0.330 m AV Peak Grad:      11.0 mmHg AV Mean Grad:      4.3 mmHg LVOT Vmax:         132.50 cm/s LVOT Vmean:        87.350 cm/s LVOT VTI:          0.332 m LVOT/AV VTI ratio: 1.01  AORTA Ao Root diam: 2.40 cm Ao Asc diam:  2.70 cm Ao Arch diam: 2.6 cm  MITRAL VALVE                TRICUSPID VALVE MV Area (PHT): 3.68 cm     TR Peak grad:   30.5 mmHg MV Decel Time: 206 msec     TR Vmax:        276.00 cm/s MV E velocity: 92.00 cm/s MV A velocity: 105.00 cm/s  SHUNTS MV E/A ratio:  0.88         Systemic VTI:  0.33 m Systemic Diam: 1.90 cm  Kate Sable MD Electronically signed by Kate Sable MD Signature Date/Time: 06/22/2021/4:51:21 PM    Final   TEE  ECHO TEE  08/21/2015  Narrative *[facility]* [address] [place], [place] [address] [phone]  ------------------------------------------------------------------- Transesophageal Echocardiography  Patient:    Felisa, Vega MR #:       [id number] Study Date: 08/16/2015 Gender:     F Age:        38 Height:     172.7 cm Weight:     75 kg BSA:        1.91 m^2 Pt. Status: Room:       2W25C  ADMITTING    Dahlia Byes ATTENDING    VanTrigt, Vernon Prey REFERRING    VanTrigt, Peter PERFORMING   Albertha Ghee, MD SONOGRAPHER  Roseanna Rainbow  cc:  ------------------------------------------------------------------- LV EF: 60% -   65%  ------------------------------------------------------------------- Indications:      424.1 Aortic valve disorders.  ------------------------------------------------------------------- History:   PMH:  Aortic stenosis.  Risk factors:  Hypertension. Dyslipidemia.  ------------------------------------------------------------------- Study Conclusions  - Left ventricle: Systolic function was normal. The estimated ejection fraction was in the range of 60% to 65%. Wall motion was normal; there were no regional wall motion abnormalities. - Mitral valve: There was mild regurgitation. - Left atrium: No evidence of thrombus in the atrial cavity or appendage. - Right atrium: No evidence of thrombus in the atrial cavity or appendage.  Diagnostic transesophageal echocardiography.  2D and color Doppler. Birthdate:  Patient birthdate: 1946-02-15.  Age:  Patient is 77 yr old.  Sex:  Gender: female.    BMI: 25.1 kg/m^2.  Patient status: Inpatient.  Study date:  Study date: 08/16/2015. Study time: 09:00 AM.  Location:  Operating room.  -------------------------------------------------------------------  ------------------------------------------------------------------- Left ventricle:  Systolic function  was normal. The estimated ejection fraction was in the range of 60% to 65%. Wall motion was normal; there were no regional wall motion abnormalities.  ------------------------------------------------------------------- Aortic valve:   Trileaflet. Severe stenosis is present. Heavily calcified leaflets.  Doppler:  There was no significant regurgitation. After CPB, the prosthetic aortic valve is seen clearly and functioning properly.  ------------------------------------------------------------------- Aorta:  There was no evidence for dissection. Aortic root: The aortic root was not dilated. Ascending aorta: The ascending aorta was normal in size. Aortic arch: The aortic arch was normal in size. Descending aorta: The descending aorta was normal in size. Atheroma present here measuring 0.645 x 1.13 cm.  ------------------------------------------------------------------- Mitral valve:   Structurally normal valve.   Leaflet separation was normal.  Doppler:  There was mild regurgitation.  ------------------------------------------------------------------- Left atrium:  The atrium was normal in size.  No evidence of thrombus in the atrial cavity or appendage. The appendage was morphologically a left appendage, multilobulated, and of normal size. Emptying velocity was normal.  ------------------------------------------------------------------- Right ventricle:  The cavity size was normal. Wall thickness was normal. Systolic function was normal.  ------------------------------------------------------------------- Pulmonic valve:    Structurally normal valve.  ------------------------------------------------------------------- Tricuspid valve:   Structurally normal valve.   Leaflet separation was normal.  Doppler:  There was no significant regurgitation.  ------------------------------------------------------------------- Pulmonary artery:   The main pulmonary artery was  normal-sized.  ------------------------------------------------------------------- Right atrium:  The atrium was normal in size.  No evidence of thrombus in the atrial cavity or appendage. The appendage was morphologically a right appendage.  ------------------------------------------------------------------- Pericardium:  There was no pericardial effusion.  ------------------------------------------------------------------- Prepared and Electronically Authenticated by  Albertha Ghee, MD 2017-06-04T08:41:29             EKG:  EKG is ordered today.  The ekg  ordered today demonstrates normal sinus rhythm, HR 65 BPM   Recent Labs: No results found for requested labs within last 365 days.  Recent Lipid Panel    Component Value Date/Time   CHOL 174 11/26/2018 1159   TRIG 50 02/03/2020 1410   HDL 71.30 11/26/2018 1159   CHOLHDL 2 11/26/2018 1159   VLDL 19.2 11/26/2018 1159   LDLCALC 84 11/26/2018 1159     Risk Assessment/Calculations:                Physical Exam:    VS:  BP 120/64 (BP Location: Left Arm, Patient Position: Sitting, Cuff Size: Normal)   Pulse 65   Ht 5\' 7"  (1.702 m)   Wt 167 lb 12.8 oz (76.1 kg)   SpO2 98%   BMI 26.28 kg/m     Wt Readings from Last 3 Encounters:  06/13/22 167 lb 12.8 oz (76.1 kg)  05/05/21 167 lb 2 oz (75.8 kg)  05/02/20 165 lb (74.8 kg)     GEN:  Well nourished, well developed in no acute distress.  Sitting comfortably on the exam table in no acute distress  HEENT: Normal NECK: No JVD CARDIAC: RRR, no murmurs, rubs, gallops. Radial pulses 2+ bilaterally  RESPIRATORY:  Clear to auscultation without rales, wheezing or rhonchi. Normal WOB on room air  ABDOMEN: Soft, non-tender, non-distended MUSCULOSKELETAL:  No edema in BLE; No deformity  SKIN: Warm and dry NEUROLOGIC:  Alert and oriented x 3 PSYCHIATRIC:  Normal affect   ASSESSMENT:    1. Dyspnea on exertion   2. History of aortic valve replacement with bioprosthetic  valve   3. Essential hypertension   4. HYPERCHOLESTEROLEMIA   5. Nonobstructive atherosclerosis of coronary artery    PLAN:    In order of problems listed above:  Dyspnea - When last seen by cardiology in 04/2021, patient complained of significant shortness of breath on exertion.  Echocardiogram in 06/2021 showed EF 123456, grade 2 diastolic dysfunction, normal RV systolic function.  Aortic valve regurgitation not visualized, aortic valve mean gradient measured 4.3 mmHg.  She was encouraged to increase her physical activity as tolerated - Today, patient reports that her breathing has improved since she started exercising, but she continues to have shortness of breath on exertion.  She is able to do yoga, work in her garden, walk around her house, and grocery shop without developing shortness of breath.  No chest pain on exertion. -Patient believes that her dyspnea is due to deconditioning.  Offered repeating echocardiogram or nuclear stress test, but patient declined. She would rather continue to exercise   Aortic Stenosis S/p Bovine AVR - Patient underwent aortic valve replacement with a bovine valve in 07/2015 - Most recent echocardiogram from 06/2021 showed no regurgitation through the aortic valve and mean gradient was 4.3 mmHg  - Plan to repeat echocardiogram in 2027 (10 years after AVR).  Can also repeat echocardiogram as needed if patient has new or worsening symptoms.  HTN  - BP well controlled on current medications - 120/64 in office  - Continue amlodipine 5 mg daily, HCTZ 25 mg daily, lisinopril 20 mg daily, metoprolol tartate 25 mg BID  - Patient had labs checked with her primary care provider a couple of weeks ago.  Requested that labs be faxed over to our office  HLD  - Patient has been taking lipitor 0.5 mg daily - Labs were checked by her PCP a few weeks ago, reports that her LDL was 90   Mild  nonobstructive CAD  - Patient had a R/L heart catheterization in 07/2015 prior to  AVR. Showed mild, nonobstructive CAD  - Continue daily ASA, metoprolol tartrate 25 mg BID, lipitor 5 mg daily    Medication Adjustments/Labs and Tests Ordered: Current medicines are reviewed at length with the patient today.  Concerns regarding medicines are outlined above.  No orders of the defined types were placed in this encounter.  No orders of the defined types were placed in this encounter.   Patient Instructions  Medication Instructions:  Your physician recommends that you continue on your current medications as directed. Please refer to the Current Medication list given to you today.  *If you need a refill on your cardiac medications before your next appointment, please call your pharmacy*   Lab Work: No labs ordered  If you have labs (blood work) drawn today and your tests are completely normal, you will receive your results only by: [facility] (if you have MyChart) OR A paper copy in the mail If you have any lab test that is abnormal or we need to change your treatment, we will call you to review the results.   Testing/Procedures: No testing ordered  Follow-Up: At [facility], you and your health needs are our priority.  As part of our continuing mission to provide you with exceptional heart care, we have created designated Provider Care Teams.  These Care Teams include your primary Cardiologist (physician) and Advanced Practice Providers (APPs -  Physician Assistants and Nurse Practitioners) who all work together to provide you with the care you need, when you need it.  We recommend signing up for the patient portal called "MyChart".  Sign up information is provided on this After Visit Summary.  MyChart is used to connect with patients for Virtual Visits (Telemedicine).  Patients are able to view lab/test results, encounter notes, upcoming appointments, etc.  Non-urgent messages can be sent to your provider as well.   To learn more about what you can do  with MyChart, go to [email or web address].    Your next appointment:   1 year(s)  Provider:   You may see Ida Rogue, MD or one of the following Advanced Practice Providers on your designated Care Team:   Murray Hodgkins, NP Christell Faith, PA-C Cadence Kathlen Mody, PA-C Gerrie Nordmann, NP    Signed, Margie Billet, PA-C  06/13/2022 12:16 PM    [place]

## 2022-06-13 ENCOUNTER — Ambulatory Visit: Payer: Medicare PPO | Attending: Nurse Practitioner | Admitting: Cardiology

## 2022-06-13 ENCOUNTER — Encounter: Payer: Self-pay | Admitting: Nurse Practitioner

## 2022-06-13 VITALS — BP 120/64 | HR 65 | Ht 67.0 in | Wt 167.8 lb

## 2022-06-13 DIAGNOSIS — I10 Essential (primary) hypertension: Secondary | ICD-10-CM

## 2022-06-13 DIAGNOSIS — I2510 Atherosclerotic heart disease of native coronary artery without angina pectoris: Secondary | ICD-10-CM

## 2022-06-13 DIAGNOSIS — R0609 Other forms of dyspnea: Secondary | ICD-10-CM

## 2022-06-13 DIAGNOSIS — E7800 Pure hypercholesterolemia, unspecified: Secondary | ICD-10-CM

## 2022-06-13 DIAGNOSIS — Z953 Presence of xenogenic heart valve: Secondary | ICD-10-CM | POA: Diagnosis not present

## 2022-06-13 NOTE — Patient Instructions (Signed)
Medication Instructions:  Your physician recommends that you continue on your current medications as directed. Please refer to the Current Medication list given to you today.  *If you need a refill on your cardiac medications before your next appointment, please call your pharmacy*   Lab Work: No labs ordered  If you have labs (blood work) drawn today and your tests are completely normal, you will receive your results only by: [place] (if you have MyChart) OR A paper copy in the mail If you have any lab test that is abnormal or we need to change your treatment, we will call you to review the results.   Testing/Procedures: No testing ordered  Follow-Up: At [facility], you and your health needs are our priority.  As part of our continuing mission to provide you with exceptional heart care, we have created designated Provider Care Teams.  These Care Teams include your primary Cardiologist (physician) and Advanced Practice Providers (APPs -  Physician Assistants and Nurse Practitioners) who all work together to provide you with the care you need, when you need it.  We recommend signing up for the patient portal called "MyChart".  Sign up information is provided on this After Visit Summary.  MyChart is used to connect with patients for Virtual Visits (Telemedicine).  Patients are able to view lab/test results, encounter notes, upcoming appointments, etc.  Non-urgent messages can be sent to your provider as well.   To learn more about what you can do with MyChart, go to [email or web address].    Your next appointment:   1 year(s)  Provider:   You may see Ida Rogue, MD or one of the following Advanced Practice Providers on your designated Care Team:   Murray Hodgkins, NP Christell Faith, PA-C Cadence Kathlen Mody, PA-C Gerrie Nordmann, NP

## 2022-06-15 NOTE — Addendum Note (Signed)
Addended by: James Ivanoff D on: 06/15/2022 09:31 AM   Modules accepted: Orders

## 2022-06-22 ENCOUNTER — Other Ambulatory Visit: Payer: Self-pay | Admitting: Cardiovascular Disease

## 2022-06-22 MED ORDER — POTASSIUM CHLORIDE ER 10 MEQ PO TBCR: 10.0000 meq | EXTENDED_RELEASE_TABLET | Freq: Every day | ORAL | 2 refills | Status: DC

## 2022-06-22 MED ORDER — LISINOPRIL 20 MG PO TABS: 20.0000 mg | ORAL_TABLET | Freq: Every day | ORAL | 2 refills | Status: DC

## 2022-06-22 MED ORDER — HYDROCHLOROTHIAZIDE 25 MG PO TABS: 25.0000 mg | ORAL_TABLET | Freq: Every day | ORAL | 2 refills | Status: DC

## 2022-06-22 NOTE — Addendum Note (Signed)
Addended by: Thayer Headings, Jaceon Heiberger L on: 06/22/2022 02:56 PM   Modules accepted: Orders

## 2022-06-22 NOTE — Telephone Encounter (Signed)
Pt was seen on 06/13/22

## 2022-06-22 NOTE — Telephone Encounter (Signed)
Requested Prescriptions   Signed Prescriptions Disp Refills   potassium chloride (KLOR-CON) 10 MEQ tablet 90 tablet 2    Sig: Take 1 tablet (10 mEq total) by mouth daily.    Authorizing Provider: Antonieta Iba    Ordering User: Thayer Headings, Katai Marsico L   hydrochlorothiazide (HYDRODIURIL) 25 MG tablet 90 tablet 2    Sig: Take 1 tablet (25 mg total) by mouth daily.    Authorizing Provider: Antonieta Iba    Ordering User: Thayer Headings, Taeya Theall L   lisinopril (ZESTRIL) 20 MG tablet 90 tablet 2    Sig: Take 1 tablet (20 mg total) by mouth daily. Additional refill available at office visit    Authorizing Provider: Antonieta Iba    Ordering User: Thayer Headings, Miyanna Wiersma L   Refused Prescriptions Disp Refills   potassium chloride (KLOR-CON) 10 MEQ tablet [Pharmacy Med Name: POTASSIUM CL ER 10 MEQ TABLET] 90 tablet 1    Sig: TAKE 1 TABLET (10 MEQ TOTAL) BY MOUTH DAILY. ADDITIONAL REFILL AVAILABLE AT OFFICE VISIT    Refused By: Thayer Headings, Garrus Gauthreaux L    Reason for Refusal: Request already responded to by other means (e.g. phone or fax)   hydrochlorothiazide (HYDRODIURIL) 25 MG tablet [Pharmacy Med Name: HYDROCHLOROTHIAZIDE 25 MG TAB] 90 tablet 1    Sig: TAKE 1 TABLET (25 MG TOTAL) BY MOUTH DAILY. ADDITIONAL REFILL AVAILABLE AT OFFICE VISIT    Refused By: Thayer Headings, Linard Daft L    Reason for Refusal: Request already responded to by other means (e.g. phone or fax)   lisinopril (ZESTRIL) 20 MG tablet [Pharmacy Med Name: LISINOPRIL 20 MG TABLET] 90 tablet 1    Sig: TAKE 1 TABLET (20 MG TOTAL) BY MOUTH DAILY. ADDITIONAL REFILL AVAILABLE AT OFFICE VISIT    Refused By: Thayer Headings, Kolt Mcwhirter L    Reason for Refusal: Request already responded to by other means (e.g. phone or fax)

## 2022-06-22 NOTE — Telephone Encounter (Signed)
Please schedule overdue F/U appointment for 90 day refills. Thank you! 

## 2022-06-25 ENCOUNTER — Other Ambulatory Visit: Payer: Self-pay | Admitting: Cardiovascular Disease

## 2022-06-25 NOTE — Telephone Encounter (Signed)
Last visit 05/15/22, 1 yr f/u plan

## 2022-06-26 DIAGNOSIS — M9905 Segmental and somatic dysfunction of pelvic region: Secondary | ICD-10-CM | POA: Diagnosis not present

## 2022-06-26 DIAGNOSIS — M955 Acquired deformity of pelvis: Secondary | ICD-10-CM | POA: Diagnosis not present

## 2022-06-26 DIAGNOSIS — M9903 Segmental and somatic dysfunction of lumbar region: Secondary | ICD-10-CM | POA: Diagnosis not present

## 2022-06-26 DIAGNOSIS — M62830 Muscle spasm of back: Secondary | ICD-10-CM | POA: Diagnosis not present

## 2022-06-27 ENCOUNTER — Other Ambulatory Visit: Payer: Self-pay | Admitting: Cardiovascular Disease

## 2022-06-28 DIAGNOSIS — Z6826 Body mass index (BMI) 26.0-26.9, adult: Secondary | ICD-10-CM | POA: Diagnosis not present

## 2022-06-28 DIAGNOSIS — M48062 Spinal stenosis, lumbar region with neurogenic claudication: Secondary | ICD-10-CM | POA: Diagnosis not present

## 2022-06-28 NOTE — Telephone Encounter (Signed)
Good morning,   Could you please contact the patient to schedule a yearly follow up? The patient was last seen by Dr. Mariah Milling on 05-05-21. Thank you so much.

## 2022-07-10 DIAGNOSIS — M9905 Segmental and somatic dysfunction of pelvic region: Secondary | ICD-10-CM | POA: Diagnosis not present

## 2022-07-10 DIAGNOSIS — M62830 Muscle spasm of back: Secondary | ICD-10-CM | POA: Diagnosis not present

## 2022-07-10 DIAGNOSIS — M955 Acquired deformity of pelvis: Secondary | ICD-10-CM | POA: Diagnosis not present

## 2022-07-10 DIAGNOSIS — M9903 Segmental and somatic dysfunction of lumbar region: Secondary | ICD-10-CM | POA: Diagnosis not present

## 2022-07-13 ENCOUNTER — Other Ambulatory Visit: Payer: Self-pay | Admitting: Family Medicine

## 2022-07-13 DIAGNOSIS — Z1231 Encounter for screening mammogram for malignant neoplasm of breast: Secondary | ICD-10-CM

## 2022-07-24 DIAGNOSIS — M62830 Muscle spasm of back: Secondary | ICD-10-CM | POA: Diagnosis not present

## 2022-07-24 DIAGNOSIS — M9903 Segmental and somatic dysfunction of lumbar region: Secondary | ICD-10-CM | POA: Diagnosis not present

## 2022-07-24 DIAGNOSIS — M955 Acquired deformity of pelvis: Secondary | ICD-10-CM | POA: Diagnosis not present

## 2022-07-24 DIAGNOSIS — M9905 Segmental and somatic dysfunction of pelvic region: Secondary | ICD-10-CM | POA: Diagnosis not present

## 2022-08-21 DIAGNOSIS — M955 Acquired deformity of pelvis: Secondary | ICD-10-CM | POA: Diagnosis not present

## 2022-08-21 DIAGNOSIS — M62830 Muscle spasm of back: Secondary | ICD-10-CM | POA: Diagnosis not present

## 2022-08-21 DIAGNOSIS — M9905 Segmental and somatic dysfunction of pelvic region: Secondary | ICD-10-CM | POA: Diagnosis not present

## 2022-08-21 DIAGNOSIS — M9903 Segmental and somatic dysfunction of lumbar region: Secondary | ICD-10-CM | POA: Diagnosis not present

## 2022-08-28 ENCOUNTER — Ambulatory Visit
Admission: RE | Admit: 2022-08-28 | Discharge: 2022-08-28 | Disposition: A | Discharge status: Home / Self Care | Payer: Medicare PPO | Source: Ambulatory Visit | Attending: Family Medicine | Admitting: Family Medicine

## 2022-08-28 DIAGNOSIS — Z1231 Encounter for screening mammogram for malignant neoplasm of breast: Secondary | ICD-10-CM | POA: Insufficient documentation

## 2022-09-01 DIAGNOSIS — J301 Allergic rhinitis due to pollen: Secondary | ICD-10-CM | POA: Diagnosis not present

## 2022-09-01 DIAGNOSIS — I10 Essential (primary) hypertension: Secondary | ICD-10-CM | POA: Diagnosis not present

## 2022-09-01 DIAGNOSIS — M545 Low back pain, unspecified: Secondary | ICD-10-CM | POA: Diagnosis not present

## 2022-09-01 DIAGNOSIS — E785 Hyperlipidemia, unspecified: Secondary | ICD-10-CM | POA: Diagnosis not present

## 2022-09-01 DIAGNOSIS — M1990 Unspecified osteoarthritis, unspecified site: Secondary | ICD-10-CM | POA: Diagnosis not present

## 2022-09-01 DIAGNOSIS — K219 Gastro-esophageal reflux disease without esophagitis: Secondary | ICD-10-CM | POA: Diagnosis not present

## 2022-09-01 DIAGNOSIS — M4800 Spinal stenosis, site unspecified: Secondary | ICD-10-CM | POA: Diagnosis not present

## 2022-09-01 DIAGNOSIS — M8580 Other specified disorders of bone density and structure, unspecified site: Secondary | ICD-10-CM | POA: Diagnosis not present

## 2022-09-01 DIAGNOSIS — I2510 Atherosclerotic heart disease of native coronary artery without angina pectoris: Secondary | ICD-10-CM | POA: Diagnosis not present

## 2022-09-18 DIAGNOSIS — M9905 Segmental and somatic dysfunction of pelvic region: Secondary | ICD-10-CM | POA: Diagnosis not present

## 2022-09-18 DIAGNOSIS — M955 Acquired deformity of pelvis: Secondary | ICD-10-CM | POA: Diagnosis not present

## 2022-09-18 DIAGNOSIS — M9903 Segmental and somatic dysfunction of lumbar region: Secondary | ICD-10-CM | POA: Diagnosis not present

## 2022-09-18 DIAGNOSIS — M62830 Muscle spasm of back: Secondary | ICD-10-CM | POA: Diagnosis not present

## 2022-10-16 DIAGNOSIS — M955 Acquired deformity of pelvis: Secondary | ICD-10-CM | POA: Diagnosis not present

## 2022-10-16 DIAGNOSIS — M62830 Muscle spasm of back: Secondary | ICD-10-CM | POA: Diagnosis not present

## 2022-10-16 DIAGNOSIS — M9903 Segmental and somatic dysfunction of lumbar region: Secondary | ICD-10-CM | POA: Diagnosis not present

## 2022-10-16 DIAGNOSIS — M9905 Segmental and somatic dysfunction of pelvic region: Secondary | ICD-10-CM | POA: Diagnosis not present

## 2022-10-23 DIAGNOSIS — M9903 Segmental and somatic dysfunction of lumbar region: Secondary | ICD-10-CM | POA: Diagnosis not present

## 2022-10-23 DIAGNOSIS — M9905 Segmental and somatic dysfunction of pelvic region: Secondary | ICD-10-CM | POA: Diagnosis not present

## 2022-10-23 DIAGNOSIS — M955 Acquired deformity of pelvis: Secondary | ICD-10-CM | POA: Diagnosis not present

## 2022-10-23 DIAGNOSIS — M62830 Muscle spasm of back: Secondary | ICD-10-CM | POA: Diagnosis not present

## 2022-11-06 DIAGNOSIS — M9903 Segmental and somatic dysfunction of lumbar region: Secondary | ICD-10-CM | POA: Diagnosis not present

## 2022-11-06 DIAGNOSIS — M955 Acquired deformity of pelvis: Secondary | ICD-10-CM | POA: Diagnosis not present

## 2022-11-06 DIAGNOSIS — M9905 Segmental and somatic dysfunction of pelvic region: Secondary | ICD-10-CM | POA: Diagnosis not present

## 2022-11-06 DIAGNOSIS — M62830 Muscle spasm of back: Secondary | ICD-10-CM | POA: Diagnosis not present

## 2022-12-03 ENCOUNTER — Other Ambulatory Visit: Payer: Self-pay | Admitting: Family Medicine

## 2022-12-03 DIAGNOSIS — M8588 Other specified disorders of bone density and structure, other site: Secondary | ICD-10-CM | POA: Diagnosis not present

## 2022-12-03 DIAGNOSIS — Z0000 Encounter for general adult medical examination without abnormal findings: Secondary | ICD-10-CM | POA: Diagnosis not present

## 2022-12-03 DIAGNOSIS — Z1211 Encounter for screening for malignant neoplasm of colon: Secondary | ICD-10-CM | POA: Diagnosis not present

## 2022-12-03 DIAGNOSIS — Z952 Presence of prosthetic heart valve: Secondary | ICD-10-CM | POA: Diagnosis not present

## 2022-12-03 DIAGNOSIS — I10 Essential (primary) hypertension: Secondary | ICD-10-CM | POA: Diagnosis not present

## 2022-12-03 DIAGNOSIS — K219 Gastro-esophageal reflux disease without esophagitis: Secondary | ICD-10-CM | POA: Diagnosis not present

## 2022-12-03 DIAGNOSIS — N183 Chronic kidney disease, stage 3 unspecified: Secondary | ICD-10-CM | POA: Diagnosis not present

## 2022-12-03 DIAGNOSIS — E7800 Pure hypercholesterolemia, unspecified: Secondary | ICD-10-CM | POA: Diagnosis not present

## 2022-12-04 DIAGNOSIS — M9905 Segmental and somatic dysfunction of pelvic region: Secondary | ICD-10-CM | POA: Diagnosis not present

## 2022-12-04 DIAGNOSIS — M955 Acquired deformity of pelvis: Secondary | ICD-10-CM | POA: Diagnosis not present

## 2022-12-04 DIAGNOSIS — M62830 Muscle spasm of back: Secondary | ICD-10-CM | POA: Diagnosis not present

## 2022-12-04 DIAGNOSIS — M9903 Segmental and somatic dysfunction of lumbar region: Secondary | ICD-10-CM | POA: Diagnosis not present

## 2022-12-26 DIAGNOSIS — M48062 Spinal stenosis, lumbar region with neurogenic claudication: Secondary | ICD-10-CM | POA: Diagnosis not present

## 2023-01-01 DIAGNOSIS — M955 Acquired deformity of pelvis: Secondary | ICD-10-CM | POA: Diagnosis not present

## 2023-01-01 DIAGNOSIS — M9903 Segmental and somatic dysfunction of lumbar region: Secondary | ICD-10-CM | POA: Diagnosis not present

## 2023-01-01 DIAGNOSIS — M9905 Segmental and somatic dysfunction of pelvic region: Secondary | ICD-10-CM | POA: Diagnosis not present

## 2023-01-01 DIAGNOSIS — M62830 Muscle spasm of back: Secondary | ICD-10-CM | POA: Diagnosis not present

## 2023-01-29 DIAGNOSIS — M9903 Segmental and somatic dysfunction of lumbar region: Secondary | ICD-10-CM | POA: Diagnosis not present

## 2023-01-29 DIAGNOSIS — M62830 Muscle spasm of back: Secondary | ICD-10-CM | POA: Diagnosis not present

## 2023-01-29 DIAGNOSIS — M9905 Segmental and somatic dysfunction of pelvic region: Secondary | ICD-10-CM | POA: Diagnosis not present

## 2023-01-29 DIAGNOSIS — M955 Acquired deformity of pelvis: Secondary | ICD-10-CM | POA: Diagnosis not present

## 2023-02-26 ENCOUNTER — Ambulatory Visit
Admission: RE | Admit: 2023-02-26 | Discharge: 2023-02-26 | Disposition: A | Discharge status: Home / Self Care | Payer: Medicare PPO | Source: Ambulatory Visit | Attending: Family Medicine | Admitting: Family Medicine

## 2023-02-26 DIAGNOSIS — M9903 Segmental and somatic dysfunction of lumbar region: Secondary | ICD-10-CM | POA: Diagnosis not present

## 2023-02-26 DIAGNOSIS — M955 Acquired deformity of pelvis: Secondary | ICD-10-CM | POA: Diagnosis not present

## 2023-02-26 DIAGNOSIS — M9905 Segmental and somatic dysfunction of pelvic region: Secondary | ICD-10-CM | POA: Diagnosis not present

## 2023-02-26 DIAGNOSIS — M8589 Other specified disorders of bone density and structure, multiple sites: Secondary | ICD-10-CM | POA: Diagnosis not present

## 2023-02-26 DIAGNOSIS — M8588 Other specified disorders of bone density and structure, other site: Secondary | ICD-10-CM | POA: Diagnosis not present

## 2023-02-26 DIAGNOSIS — M62830 Muscle spasm of back: Secondary | ICD-10-CM | POA: Diagnosis not present

## 2023-03-16 ENCOUNTER — Other Ambulatory Visit: Payer: Self-pay | Admitting: Cardiovascular Disease

## 2023-03-20 ENCOUNTER — Other Ambulatory Visit: Payer: Self-pay | Admitting: Cardiovascular Disease

## 2023-03-26 DIAGNOSIS — M9905 Segmental and somatic dysfunction of pelvic region: Secondary | ICD-10-CM | POA: Diagnosis not present

## 2023-03-26 DIAGNOSIS — M9903 Segmental and somatic dysfunction of lumbar region: Secondary | ICD-10-CM | POA: Diagnosis not present

## 2023-03-26 DIAGNOSIS — M62830 Muscle spasm of back: Secondary | ICD-10-CM | POA: Diagnosis not present

## 2023-03-26 DIAGNOSIS — M955 Acquired deformity of pelvis: Secondary | ICD-10-CM | POA: Diagnosis not present

## 2023-04-23 DIAGNOSIS — M62830 Muscle spasm of back: Secondary | ICD-10-CM | POA: Diagnosis not present

## 2023-04-23 DIAGNOSIS — M9903 Segmental and somatic dysfunction of lumbar region: Secondary | ICD-10-CM | POA: Diagnosis not present

## 2023-04-23 DIAGNOSIS — M9905 Segmental and somatic dysfunction of pelvic region: Secondary | ICD-10-CM | POA: Diagnosis not present

## 2023-04-23 DIAGNOSIS — M955 Acquired deformity of pelvis: Secondary | ICD-10-CM | POA: Diagnosis not present

## 2023-05-21 DIAGNOSIS — M9905 Segmental and somatic dysfunction of pelvic region: Secondary | ICD-10-CM | POA: Diagnosis not present

## 2023-05-21 DIAGNOSIS — M62830 Muscle spasm of back: Secondary | ICD-10-CM | POA: Diagnosis not present

## 2023-05-21 DIAGNOSIS — M9903 Segmental and somatic dysfunction of lumbar region: Secondary | ICD-10-CM | POA: Diagnosis not present

## 2023-05-21 DIAGNOSIS — M955 Acquired deformity of pelvis: Secondary | ICD-10-CM | POA: Diagnosis not present

## 2023-06-10 DIAGNOSIS — I10 Essential (primary) hypertension: Secondary | ICD-10-CM | POA: Diagnosis not present

## 2023-06-10 DIAGNOSIS — M8580 Other specified disorders of bone density and structure, unspecified site: Secondary | ICD-10-CM | POA: Diagnosis not present

## 2023-06-10 DIAGNOSIS — E7800 Pure hypercholesterolemia, unspecified: Secondary | ICD-10-CM | POA: Diagnosis not present

## 2023-06-10 DIAGNOSIS — N183 Chronic kidney disease, stage 3 unspecified: Secondary | ICD-10-CM | POA: Diagnosis not present

## 2023-06-16 ENCOUNTER — Other Ambulatory Visit: Payer: Self-pay | Admitting: Cardiovascular Disease

## 2023-06-18 DIAGNOSIS — M9903 Segmental and somatic dysfunction of lumbar region: Secondary | ICD-10-CM | POA: Diagnosis not present

## 2023-06-18 DIAGNOSIS — M955 Acquired deformity of pelvis: Secondary | ICD-10-CM | POA: Diagnosis not present

## 2023-06-18 DIAGNOSIS — M9905 Segmental and somatic dysfunction of pelvic region: Secondary | ICD-10-CM | POA: Diagnosis not present

## 2023-06-18 DIAGNOSIS — M62830 Muscle spasm of back: Secondary | ICD-10-CM | POA: Diagnosis not present

## 2023-06-19 DIAGNOSIS — N183 Chronic kidney disease, stage 3 unspecified: Secondary | ICD-10-CM | POA: Diagnosis not present

## 2023-06-19 DIAGNOSIS — I10 Essential (primary) hypertension: Secondary | ICD-10-CM | POA: Diagnosis not present

## 2023-06-19 DIAGNOSIS — E7800 Pure hypercholesterolemia, unspecified: Secondary | ICD-10-CM | POA: Diagnosis not present

## 2023-06-19 DIAGNOSIS — M8588 Other specified disorders of bone density and structure, other site: Secondary | ICD-10-CM | POA: Diagnosis not present

## 2023-06-19 DIAGNOSIS — D649 Anemia, unspecified: Secondary | ICD-10-CM | POA: Diagnosis not present

## 2023-06-19 DIAGNOSIS — K219 Gastro-esophageal reflux disease without esophagitis: Secondary | ICD-10-CM | POA: Diagnosis not present

## 2023-06-19 DIAGNOSIS — Z952 Presence of prosthetic heart valve: Secondary | ICD-10-CM | POA: Diagnosis not present

## 2023-07-23 DIAGNOSIS — M9903 Segmental and somatic dysfunction of lumbar region: Secondary | ICD-10-CM | POA: Diagnosis not present

## 2023-07-23 DIAGNOSIS — M9905 Segmental and somatic dysfunction of pelvic region: Secondary | ICD-10-CM | POA: Diagnosis not present

## 2023-07-23 DIAGNOSIS — M955 Acquired deformity of pelvis: Secondary | ICD-10-CM | POA: Diagnosis not present

## 2023-07-23 DIAGNOSIS — M62830 Muscle spasm of back: Secondary | ICD-10-CM | POA: Diagnosis not present

## 2023-07-26 ENCOUNTER — Encounter: Payer: Self-pay | Admitting: Cardiovascular Disease

## 2023-07-26 ENCOUNTER — Ambulatory Visit: Attending: Cardiovascular Disease | Admitting: Cardiovascular Disease

## 2023-07-26 VITALS — BP 120/60 | HR 58 | Ht 67.0 in | Wt 168.0 lb

## 2023-07-26 DIAGNOSIS — Z953 Presence of xenogenic heart valve: Secondary | ICD-10-CM

## 2023-07-26 DIAGNOSIS — E7800 Pure hypercholesterolemia, unspecified: Secondary | ICD-10-CM | POA: Diagnosis not present

## 2023-07-26 DIAGNOSIS — R0609 Other forms of dyspnea: Secondary | ICD-10-CM | POA: Diagnosis not present

## 2023-07-26 DIAGNOSIS — R55 Syncope and collapse: Secondary | ICD-10-CM | POA: Diagnosis not present

## 2023-07-26 DIAGNOSIS — I2510 Atherosclerotic heart disease of native coronary artery without angina pectoris: Secondary | ICD-10-CM

## 2023-07-26 DIAGNOSIS — I10 Essential (primary) hypertension: Secondary | ICD-10-CM | POA: Diagnosis not present

## 2023-07-26 MED ORDER — HYDROCHLOROTHIAZIDE 25 MG PO TABS: 25.0000 mg | ORAL_TABLET | Freq: Every day | ORAL | 3 refills | Status: AC

## 2023-07-26 MED ORDER — METOPROLOL TARTRATE 25 MG PO TABS
25.0000 mg | ORAL_TABLET | Freq: Two times a day (BID) | ORAL | 3 refills | Status: AC
Start: 2023-07-26 — End: ?

## 2023-07-26 NOTE — Addendum Note (Signed)
 Addended by: Elvia Hammans on: 07/26/2023 09:54 AM   Modules accepted: Orders

## 2023-07-26 NOTE — Progress Notes (Signed)
 Cardiology Office Note  Date:  07/26/2023   ID:  Heidi Middleton, DOB February 05, 1946, MRN [id number]  PCP:  Napolean Backbone.Rozelle Corning, MD (Inactive)   Cc: f/u of AVR  HPI:  Heidi Middleton is a 78 y.o. female with PMH of s/p AVR 08/16/2015 bovine previous smoker Does exercise 4 days a week presents for follow-up of her aortic valve stenosis, s/p bioprosthetic AVR, HTN  Last office visit with myself 2/23 Seen by one of our providers March 2024  Very active at home, works in her large garden spring and summer Goes to the gym [facility]) x2 a week, yoga Even in winter will go to the gym  Denies chest pain or shortness of breath  Blood pressure <120 at home Reports that she is not on amlodipine  or lisinopril  Only needed extra blood pressure medication when she had back pain which was pushing her blood pressure up She went to the chiropractor, back is better, blood pressure well-controlled at home on metoprolol  and hydrochlorothiazide   Labs: Total chol 169, LDL 77, TG 66 Takes Lipitor 5 mg daily  Last echo 4/23 Normal left and right ventricular size and function Aortic valve functioning well with appropriate gradient  EKG personally reviewed by myself on todays visit EKG Interpretation Date/Time:  Friday Jul 26 2023 09:22:16 EDT Ventricular Rate:  58 PR Interval:  182 QRS Duration:  94 QT Interval:  420 QTC Calculation: 412 R Axis:   71  Text Interpretation: Sinus bradycardia Minimal voltage criteria for LVH, may be normal variant ( Sokolow-Lyon ) When compared with ECG of 03-Feb-2020 13:56, No significant change was found Confirmed by Belva Boyden [phone]) on 07/26/2023 9:30:19 AM    Other past medical hx reviewed COVID November 2021 Treated with steroids, remdesivir  Nausea vomiting diarrhea, syncope felt to be vasovagal Cough respiratory distress  Echo 01/2020, reviewed on today's visit  1. Left ventricular ejection fraction, by estimation, is 65 to 70%. The  left ventricle has normal  function. The left ventricle has no regional  wall motion abnormalities. There is mild concentric left ventricular  hypertrophy. Left ventricular diastolic  function could not be evaluated.   2. Right ventricular systolic function is normal.  3.  21 mm pericardial tissue aortic valve. Vmax 2.3 m/s, mean gradient 12  mmHG, EOA 2.15 cm2, DI 0.62. Trivial regurgitation. The aortic valve has  been repaired/replaced. Aortic valve regurgitation is trivial. There is a  21 mm Edwards pericardial valve   Echo 11/2018  Echo prior to that 2018  [facility] 08/04/2015: Prox RCA to Mid RCA lesion, 30% stenosed.   1. Mild nonobstructive coronary artery disease. 2. Severe aortic valve stenosis with a mean gradient of 51.6 mmHg with a valve area of 0.67. 3. Right heart catheterization showed normal pulmonary pressure, normal cardiac output and mildly elevated pulmonary wedge pressure.  Echo 2020, stable prosthetic valve   PMH:   has a past medical history of Adenomatous colon polyp (05/1985), Allergy, Aortic stenosis, Cataract, Cholelithiasis, COVID-19 virus infection (01/2020), Diverticulosis, GERD (gastroesophageal reflux disease), Headache, Heart murmur, History of prosthetic aortic valve, Hyperlipidemia, Hypertension, Osteopenia, PONV (postoperative nausea and vomiting), and Shingles.  PSH:    Past Surgical History:  Procedure Laterality Date   ABDOMINAL HYSTERECTOMY     AORTIC VALVE REPLACEMENT N/A 08/16/2015   Procedure: AORTIC VALVE REPLACEMENT (AVR);  Surgeon: Heriberto London, MD;  Location: [facility] OR;  Service: Open Heart Surgery;  Laterality: N/A;   APPENDECTOMY     taken out with hysterectomy  BREAST EXCISIONAL BIOPSY Right 1990   EXCISIONAL - NEG   CARDIAC CATHETERIZATION Bilateral 08/04/2015   Procedure: Right/Left Heart Cath and Coronary Angiography;  Surgeon: Wenona Hamilton, MD;  Location: [facility] INVASIVE CV LAB;  Service: Cardiovascular;  Laterality: Bilateral;   CATARACT EXTRACTION, BILATERAL   2020   ERCP N/A 10/31/2016   Procedure: ENDOSCOPIC RETROGRADE CHOLANGIOPANCREATOGRAPHY (ERCP);  Surgeon: Asencion Blacksmith, MD;  Location: [facility] ENDOSCOPY;  Service: Endoscopy;  Laterality: N/A;   EYE SURGERY N/A    detatched retina- "not sure which eye"   KNEE SURGERY  10/2005   calcinosis - in office procedure   TEE WITHOUT CARDIOVERSION N/A 08/16/2015   Procedure: TRANSESOPHAGEAL ECHOCARDIOGRAM (TEE);  Surgeon: Heriberto London, MD;  Location: [facility] OR;  Service: Open Heart Surgery;  Laterality: N/A;    Current Outpatient Medications  Medication Sig Dispense Refill   alendronate (FOSAMAX) 70 MG tablet 1 tablet 30 minutes before the first food, beverage or medicine of the day with plain water Orally for 30 days     acetaminophen  (TYLENOL ) 325 MG tablet Take 650 mg by mouth every 6 (six) hours as needed for mild pain, moderate pain, fever or headache.     albuterol  (VENTOLIN  HFA) 108 (90 Base) MCG/ACT inhaler Inhale 2 puffs into the lungs every 6 (six) hours as needed for wheezing or shortness of breath. (Patient not taking: Reported on 07/26/2023) 6.7 g 0   amLODipine  (NORVASC ) 5 MG tablet TAKE 1 TABLET (5 MG TOTAL) BY MOUTH DAILY. (Patient not taking: Reported on 07/26/2023) 90 tablet 0   aspirin  EC 81 MG tablet Take 1 tablet (81 mg total) by mouth daily. 90 tablet 3   atorvastatin  (LIPITOR) 10 MG tablet TAKE 1/2 TABLET BY MOUTH DAILY 45 tablet 3   cholecalciferol  (VITAMIN D ) 1000 UNITS tablet Take 1,000 Units by mouth 2 (two) times daily.      esomeprazole  (NEXIUM ) 40 MG capsule TAKE 1 CAPSULE BY MOUTH EVERY DAY BEFORE BREAKFAST 90 capsule 3   fluticasone  (FLONASE ) 50 MCG/ACT nasal spray Place 1-2 sprays into both nostrils daily as needed for rhinitis.     hydrochlorothiazide  (HYDRODIURIL ) 25 MG tablet TAKE 1 TABLET (25 MG TOTAL) BY MOUTH DAILY. 90 tablet 0   lisinopril  (ZESTRIL ) 20 MG tablet Take 1 tablet (20 mg total) by mouth daily. (Patient not taking: Reported on 07/26/2023) 90 tablet 0   metoprolol   tartrate (LOPRESSOR ) 25 MG tablet TAKE 1 TABLET BY MOUTH TWICE A DAY 180 tablet 0   Multiple Vitamin (MULTIVITAMIN WITH MINERALS) TABS tablet Take 1 tablet by mouth daily.     omega-3 acid ethyl esters (LOVAZA ) 1 g capsule Take 1 g by mouth 2 (two) times daily.     potassium chloride  (KLOR-CON ) 10 MEQ tablet TAKE 1 TABLET BY MOUTH EVERY DAY 90 tablet 0   No current facility-administered medications for this visit.    Allergies:   Codeine   Social History:  The patient  reports that she quit smoking about 21 years ago. Her smoking use included cigarettes. She started smoking about 46 years ago. She has never used smokeless tobacco. She reports that she does not drink alcohol and does not use drugs.   Family History:   family history includes Cancer in her brother, father, and mother; Diabetes in her maternal grandmother; Heart disease in her sister; Hypertension in her brother and brother; Lung cancer in her brother; Obesity in her sister; Pancreatic cancer in her father; Stroke in her mother.  Review of Systems: Review of Systems  Constitutional: Negative.   Respiratory: Negative.    Cardiovascular: Negative.   Gastrointestinal: Negative.   Musculoskeletal: Negative.   Neurological: Negative.   Psychiatric/Behavioral: Negative.    All other systems reviewed and are negative.   PHYSICAL EXAM: VS:  BP 120/60 (BP Location: Left Arm, Patient Position: Sitting, Cuff Size: Normal)   Pulse (!) 58   Ht 5\' 7"  (1.702 m)   Wt 168 lb (76.2 kg)   SpO2 97%   BMI 26.31 kg/m  , BMI Body mass index is 26.31 kg/m. Constitutional:  oriented to person, place, and time. No distress.  HENT:  Head: Grossly normal Eyes:  no discharge. No scleral icterus.  Neck: No JVD, no carotid bruits  Cardiovascular: Regular rate and rhythm, no murmurs appreciated Pulmonary/Chest: Clear to auscultation bilaterally, no wheezes or rales Abdominal: Soft.  no distension.  no tenderness.  Musculoskeletal: Normal  range of motion Neurological:  normal muscle tone. Coordination normal. No atrophy Skin: Skin warm and dry Psychiatric: normal affect, pleasant  Recent Labs: No results found for requested labs within last 365 days.   Lipid Panel Lab Results  Component Value Date   CHOL 174 11/26/2018   HDL 71.30 11/26/2018   LDLCALC 84 11/26/2018   TRIG 50 02/03/2020    Wt Readings from Last 3 Encounters:  07/26/23 168 lb (76.2 kg)  06/13/22 167 lb 12.8 oz (76.1 kg)  05/05/21 167 lb 2 oz (75.8 kg)     ASSESSMENT AND PLAN:  Nonrheumatic aortic valve stenosis - AVR echo in 2018, 2020, 2021, 2023 Stable valve with low gradient No significant murmur on exam  Essential hypertension Blood pressure is well controlled on today's visit. No changes made to the medications.  HYPERCHOLESTEROLEMIA  on lipitor 5 mg daily, Reasonable cholesterol numbers  Preventive care Recommend she continue her exercise program, working in the garden for conditioning, does yoga twice a week    Orders Placed This Encounter  Procedures   EKG 12-Lead     Signed, Juanda Noon, M.D., Ph.D. 07/26/2023  [facility] [place], [place] [phone]

## 2023-07-26 NOTE — Patient Instructions (Addendum)
 Medication Instructions:  No changes  If you need a refill on your cardiac medications before your next appointment, please call your pharmacy.   Lab work: No new labs needed  Testing/Procedures: Your physician has requested that you have an echocardiogram in 1 year. Echocardiography is a painless test that uses sound waves to create images of your heart. It provides your doctor with information about the size and shape of your heart and how well your heart's chambers and valves are working.   You may receive an ultrasound enhancing agent through an IV if needed to better visualize your heart during the echo. This procedure takes approximately one hour.  There are no restrictions for this procedure.  This will take place at [address] [facility]) [address], [place] [address]  Please note: We ask at that you not bring children with you during ultrasound (echo/ vascular) testing. Due to room size and safety concerns, children are not allowed in the ultrasound rooms during exams. Our front office staff cannot provide observation of children in our lobby area while testing is being conducted. An adult accompanying a patient to their appointment will only be allowed in the ultrasound room at the discretion of the ultrasound technician under special circumstances. We apologize for any inconvenience.   Follow-Up: At [facility], you and your health needs are our priority.  As part of our continuing mission to provide you with exceptional heart care, we have created designated Provider Care Teams.  These Care Teams include your primary Cardiologist (physician) and Advanced Practice Providers (APPs -  Physician Assistants and Nurse Practitioners) who all work together to provide you with the care you need, when you need it.  You will need a follow up appointment in 12 months after echo  Providers on your designated Care Team:   Laneta Pintos, NP Varney Gentleman, PA-C Cadence Gennaro Khat,  [place]  COVID-19 Vaccine Information can be found at: [email or web address] For questions related to vaccine distribution or appointments, please email [email or web address] or call [phone].

## 2023-08-05 ENCOUNTER — Other Ambulatory Visit: Payer: Self-pay | Admitting: Family Medicine

## 2023-08-05 DIAGNOSIS — Z1231 Encounter for screening mammogram for malignant neoplasm of breast: Secondary | ICD-10-CM

## 2023-08-20 DIAGNOSIS — M9903 Segmental and somatic dysfunction of lumbar region: Secondary | ICD-10-CM | POA: Diagnosis not present

## 2023-08-20 DIAGNOSIS — M9905 Segmental and somatic dysfunction of pelvic region: Secondary | ICD-10-CM | POA: Diagnosis not present

## 2023-08-20 DIAGNOSIS — M955 Acquired deformity of pelvis: Secondary | ICD-10-CM | POA: Diagnosis not present

## 2023-08-20 DIAGNOSIS — M62830 Muscle spasm of back: Secondary | ICD-10-CM | POA: Diagnosis not present

## 2023-08-29 ENCOUNTER — Ambulatory Visit
Admission: RE | Admit: 2023-08-29 | Discharge: 2023-08-29 | Disposition: A | Discharge status: Home / Self Care | Source: Ambulatory Visit | Attending: Family Medicine | Admitting: Family Medicine

## 2023-08-29 DIAGNOSIS — Z1231 Encounter for screening mammogram for malignant neoplasm of breast: Secondary | ICD-10-CM | POA: Diagnosis not present

## 2023-09-09 ENCOUNTER — Other Ambulatory Visit: Payer: Self-pay | Admitting: Cardiovascular Disease

## 2023-09-17 DIAGNOSIS — M9903 Segmental and somatic dysfunction of lumbar region: Secondary | ICD-10-CM | POA: Diagnosis not present

## 2023-09-17 DIAGNOSIS — M955 Acquired deformity of pelvis: Secondary | ICD-10-CM | POA: Diagnosis not present

## 2023-09-17 DIAGNOSIS — M9905 Segmental and somatic dysfunction of pelvic region: Secondary | ICD-10-CM | POA: Diagnosis not present

## 2023-09-17 DIAGNOSIS — M62830 Muscle spasm of back: Secondary | ICD-10-CM | POA: Diagnosis not present

## 2023-10-15 DIAGNOSIS — M9905 Segmental and somatic dysfunction of pelvic region: Secondary | ICD-10-CM | POA: Diagnosis not present

## 2023-10-15 DIAGNOSIS — M955 Acquired deformity of pelvis: Secondary | ICD-10-CM | POA: Diagnosis not present

## 2023-10-15 DIAGNOSIS — M62830 Muscle spasm of back: Secondary | ICD-10-CM | POA: Diagnosis not present

## 2023-10-15 DIAGNOSIS — M9903 Segmental and somatic dysfunction of lumbar region: Secondary | ICD-10-CM | POA: Diagnosis not present

## 2023-11-12 DIAGNOSIS — M62830 Muscle spasm of back: Secondary | ICD-10-CM | POA: Diagnosis not present

## 2023-11-12 DIAGNOSIS — M9905 Segmental and somatic dysfunction of pelvic region: Secondary | ICD-10-CM | POA: Diagnosis not present

## 2023-11-12 DIAGNOSIS — M955 Acquired deformity of pelvis: Secondary | ICD-10-CM | POA: Diagnosis not present

## 2023-11-12 DIAGNOSIS — M9903 Segmental and somatic dysfunction of lumbar region: Secondary | ICD-10-CM | POA: Diagnosis not present

## 2023-12-02 DIAGNOSIS — E7800 Pure hypercholesterolemia, unspecified: Secondary | ICD-10-CM | POA: Diagnosis not present

## 2023-12-02 DIAGNOSIS — I10 Essential (primary) hypertension: Secondary | ICD-10-CM | POA: Diagnosis not present

## 2023-12-02 DIAGNOSIS — M85832 Other specified disorders of bone density and structure, left forearm: Secondary | ICD-10-CM | POA: Diagnosis not present

## 2023-12-02 DIAGNOSIS — M8580 Other specified disorders of bone density and structure, unspecified site: Secondary | ICD-10-CM | POA: Diagnosis not present

## 2023-12-04 DIAGNOSIS — K219 Gastro-esophageal reflux disease without esophagitis: Secondary | ICD-10-CM | POA: Diagnosis not present

## 2023-12-04 DIAGNOSIS — E871 Hypo-osmolality and hyponatremia: Secondary | ICD-10-CM | POA: Diagnosis not present

## 2023-12-04 DIAGNOSIS — Z0000 Encounter for general adult medical examination without abnormal findings: Secondary | ICD-10-CM | POA: Diagnosis not present

## 2023-12-04 DIAGNOSIS — I10 Essential (primary) hypertension: Secondary | ICD-10-CM | POA: Diagnosis not present

## 2023-12-04 DIAGNOSIS — M8588 Other specified disorders of bone density and structure, other site: Secondary | ICD-10-CM | POA: Diagnosis not present

## 2023-12-04 DIAGNOSIS — N183 Chronic kidney disease, stage 3 unspecified: Secondary | ICD-10-CM | POA: Diagnosis not present

## 2023-12-04 DIAGNOSIS — Z952 Presence of prosthetic heart valve: Secondary | ICD-10-CM | POA: Diagnosis not present

## 2023-12-04 DIAGNOSIS — E7800 Pure hypercholesterolemia, unspecified: Secondary | ICD-10-CM | POA: Diagnosis not present

## 2023-12-10 DIAGNOSIS — M9903 Segmental and somatic dysfunction of lumbar region: Secondary | ICD-10-CM | POA: Diagnosis not present

## 2023-12-10 DIAGNOSIS — M9905 Segmental and somatic dysfunction of pelvic region: Secondary | ICD-10-CM | POA: Diagnosis not present

## 2023-12-10 DIAGNOSIS — M955 Acquired deformity of pelvis: Secondary | ICD-10-CM | POA: Diagnosis not present

## 2023-12-10 DIAGNOSIS — M62830 Muscle spasm of back: Secondary | ICD-10-CM | POA: Diagnosis not present

## 2024-01-07 DIAGNOSIS — M9905 Segmental and somatic dysfunction of pelvic region: Secondary | ICD-10-CM | POA: Diagnosis not present

## 2024-01-07 DIAGNOSIS — M62830 Muscle spasm of back: Secondary | ICD-10-CM | POA: Diagnosis not present

## 2024-01-07 DIAGNOSIS — M9903 Segmental and somatic dysfunction of lumbar region: Secondary | ICD-10-CM | POA: Diagnosis not present

## 2024-01-07 DIAGNOSIS — M955 Acquired deformity of pelvis: Secondary | ICD-10-CM | POA: Diagnosis not present

## 2024-01-27 DIAGNOSIS — E7800 Pure hypercholesterolemia, unspecified: Secondary | ICD-10-CM | POA: Diagnosis not present

## 2024-01-27 DIAGNOSIS — I10 Essential (primary) hypertension: Secondary | ICD-10-CM | POA: Diagnosis not present

## 2024-02-03 ENCOUNTER — Ambulatory Visit

## 2024-02-03 ENCOUNTER — Ambulatory Visit (INDEPENDENT_AMBULATORY_CARE_PROVIDER_SITE_OTHER)

## 2024-02-03 ENCOUNTER — Ambulatory Visit: Admitting: Podiatry

## 2024-02-03 DIAGNOSIS — M722 Plantar fascial fibromatosis: Secondary | ICD-10-CM | POA: Diagnosis not present

## 2024-02-03 MED ORDER — TRIAMCINOLONE ACETONIDE 40 MG/ML IJ SUSP
20.0000 mg | Freq: Once | INTRAMUSCULAR | Status: AC
  Administered 2024-02-03: 20 mg

## 2024-02-03 NOTE — Progress Notes (Signed)
 Subjective:  Patient ID: Heidi Middleton, female    DOB: 20-May-1945,  MRN: [id number] HPI Chief Complaint  Patient presents with   Plantar Fasciitis    Right foot    78 y.o. female presents with the above complaint.   ROS: Denies fever chills nausea mobic muscle aches pains calf pain back pain chest pain shortness of breath.  Past Medical History:  Diagnosis Date   Adenomatous colon polyp 05/1985   Allergy    Aortic stenosis    Cataract    Cholelithiasis    COVID-19 virus infection 01/2020   Diverticulosis    GERD (gastroesophageal reflux disease)    Headache    migraines prior to hysterectomy   Heart murmur    History of prosthetic aortic valve    a. 07/2015 s/p bioprosthetic AVR; b. 01/2020 Echo: EF 65-70%, no rmwa, RVSP 28.63mmHg, 21mm pericardial tissue AoV - Vmax 2.13m/s, mean grad . triv AI.   Hyperlipidemia    Hypertension    Osteopenia    PONV (postoperative nausea and vomiting)    nausea   Shingles    Past Surgical History:  Procedure Laterality Date   ABDOMINAL HYSTERECTOMY     AORTIC VALVE REPLACEMENT N/A 08/16/2015   Procedure: AORTIC VALVE REPLACEMENT (AVR);  Surgeon: Maude Fleeta Ochoa, MD;  Location: [facility] OR;  Service: Open Heart Surgery;  Laterality: N/A;   APPENDECTOMY     taken out with hysterectomy   BREAST EXCISIONAL BIOPSY Right 1990   EXCISIONAL - NEG   CARDIAC CATHETERIZATION Bilateral 08/04/2015   Procedure: Right/Left Heart Cath and Coronary Angiography;  Surgeon: Deatrice DELENA Cage, MD;  Location: [facility] INVASIVE CV LAB;  Service: Cardiovascular;  Laterality: Bilateral;   CATARACT EXTRACTION, BILATERAL  2020   ERCP N/A 10/31/2016   Procedure: ENDOSCOPIC RETROGRADE CHOLANGIOPANCREATOGRAPHY (ERCP);  Surgeon: Aneita Gwendlyn DASEN, MD;  Location: [facility] ENDOSCOPY;  Service: Endoscopy;  Laterality: N/A;   EYE SURGERY N/A    detatched retina- not sure which eye   KNEE SURGERY  10/2005   calcinosis - in office procedure   TEE WITHOUT CARDIOVERSION N/A  08/16/2015   Procedure: TRANSESOPHAGEAL ECHOCARDIOGRAM (TEE);  Surgeon: Maude Fleeta Ochoa, MD;  Location: [facility] OR;  Service: Open Heart Surgery;  Laterality: N/A;    Current Outpatient Medications:    acetaminophen  (TYLENOL ) 325 MG tablet, Take 650 mg by mouth every 6 (six) hours as needed for mild pain, moderate pain, fever or headache., Disp: , Rfl:    albuterol  (VENTOLIN  HFA) 108 (90 Base) MCG/ACT inhaler, Inhale 2 puffs into the lungs every 6 (six) hours as needed for wheezing or shortness of breath. (Patient not taking: Reported on 07/26/2023), Disp: 6.7 g, Rfl: 0   alendronate (FOSAMAX) 70 MG tablet, 1 tablet 30 minutes before the first food, beverage or medicine of the day with plain water Orally for 30 days, Disp: , Rfl:    aspirin  EC 81 MG tablet, Take 1 tablet (81 mg total) by mouth daily., Disp: 90 tablet, Rfl: 3   atorvastatin  (LIPITOR) 10 MG tablet, TAKE 1/2 TABLET BY MOUTH DAILY, Disp: 45 tablet, Rfl: 3   cholecalciferol  (VITAMIN D ) 1000 UNITS tablet, Take 1,000 Units by mouth 2 (two) times daily. , Disp: , Rfl:    esomeprazole  (NEXIUM ) 40 MG capsule, TAKE 1 CAPSULE BY MOUTH EVERY DAY BEFORE BREAKFAST, Disp: 90 capsule, Rfl: 3   fluticasone  (FLONASE ) 50 MCG/ACT nasal spray, Place 1-2 sprays into both nostrils daily as needed for rhinitis., Disp: , Rfl:  hydrochlorothiazide  (HYDRODIURIL ) 25 MG tablet, Take 1 tablet (25 mg total) by mouth daily., Disp: 90 tablet, Rfl: 3   metoprolol  tartrate (LOPRESSOR ) 25 MG tablet, Take 1 tablet (25 mg total) by mouth 2 (two) times daily., Disp: 180 tablet, Rfl: 3   Multiple Vitamin (MULTIVITAMIN WITH MINERALS) TABS tablet, Take 1 tablet by mouth daily., Disp: , Rfl:    omega-3 acid ethyl esters (LOVAZA ) 1 g capsule, Take 1 g by mouth 2 (two) times daily., Disp: , Rfl:    potassium chloride  (KLOR-CON ) 10 MEQ tablet, TAKE 1 TABLET BY MOUTH EVERY DAY, Disp: 90 tablet, Rfl: 3  Allergies  Allergen Reactions   Codeine Nausea And Vomiting   Review of  Systems Objective:  There were no vitals filed for this visit.  General: Well developed, nourished, in no acute distress, alert and oriented x3   Dermatological: Skin is warm, dry and supple bilateral. Nails x 10 are well maintained; remaining integument appears unremarkable at this time. There are no open sores, no preulcerative lesions, no rash or signs of infection present.  Vascular: Dorsalis Pedis artery and Posterior Tibial artery pedal pulses are 2/4 bilateral with immedate capillary fill time. Pedal hair growth present. No varicosities and no lower extremity edema present bilateral.   Neruologic: Grossly intact via light touch bilateral. Vibratory intact via tuning fork bilateral. Protective threshold with Semmes Wienstein monofilament intact to all pedal sites bilateral. Patellar and Achilles deep tendon reflexes 2+ bilateral. No Babinski or clonus noted bilateral.   Musculoskeletal: No gross boney pedal deformities bilateral. No pain, crepitus, or limitation noted with foot and ankle range of motion bilateral. Muscular strength 5/5 in all groups tested bilateral.  Plantar fibromas bilateral medial band of the plantar fascia centrally located medial longitudinal arch  Gait: Unassisted, Nonantalgic.    Radiographs:  Radiographs taken today demonstrate osseously mature individual no calcification within the soft tissues.  No acute findings no fractures.  Moderate degenerative mineral loss.  Assessment & Plan:   Assessment: Plantar fibromatosis  Plan: Injected the plantar fibroma bilateral.     Ralph Brouwer T. [place], [place]

## 2024-02-04 DIAGNOSIS — M955 Acquired deformity of pelvis: Secondary | ICD-10-CM | POA: Diagnosis not present

## 2024-02-04 DIAGNOSIS — M62830 Muscle spasm of back: Secondary | ICD-10-CM | POA: Diagnosis not present

## 2024-02-04 DIAGNOSIS — M9905 Segmental and somatic dysfunction of pelvic region: Secondary | ICD-10-CM | POA: Diagnosis not present

## 2024-02-04 DIAGNOSIS — M9903 Segmental and somatic dysfunction of lumbar region: Secondary | ICD-10-CM | POA: Diagnosis not present

## 2024-03-25 ENCOUNTER — Other Ambulatory Visit: Payer: Self-pay | Admitting: Cardiovascular Disease

## 2024-07-21 ENCOUNTER — Other Ambulatory Visit
